# Patient Record
Sex: Female | Born: 1945 | Race: White | Hispanic: No | Marital: Married | Smoking: Never smoker
Health system: Southern US, Community
[De-identification: ages and names within clinical notes are randomized; demographics above are authoritative.]

## PROBLEM LIST (undated history)

## (undated) DIAGNOSIS — N200 Calculus of kidney: Secondary | ICD-10-CM

## (undated) DIAGNOSIS — I10 Essential (primary) hypertension: Secondary | ICD-10-CM

## (undated) DIAGNOSIS — K2100 Gastro-esophageal reflux disease with esophagitis, without bleeding: Secondary | ICD-10-CM

## (undated) DIAGNOSIS — E119 Type 2 diabetes mellitus without complications: Secondary | ICD-10-CM

## (undated) DIAGNOSIS — Z9889 Other specified postprocedural states: Secondary | ICD-10-CM

## (undated) DIAGNOSIS — R112 Nausea with vomiting, unspecified: Secondary | ICD-10-CM

## (undated) DIAGNOSIS — E669 Obesity, unspecified: Secondary | ICD-10-CM

## (undated) DIAGNOSIS — R002 Palpitations: Secondary | ICD-10-CM

## (undated) DIAGNOSIS — M1990 Unspecified osteoarthritis, unspecified site: Secondary | ICD-10-CM

## (undated) DIAGNOSIS — E785 Hyperlipidemia, unspecified: Secondary | ICD-10-CM

## (undated) HISTORY — DX: Palpitations: R00.2

## (undated) HISTORY — PX: CATARACT EXTRACTION: SUR2

## (undated) HISTORY — DX: Type 2 diabetes mellitus without complications: E11.9

## (undated) HISTORY — PX: APPENDECTOMY: SHX54

## (undated) HISTORY — DX: Calculus of kidney: N20.0

## (undated) HISTORY — DX: Gastro-esophageal reflux disease with esophagitis: K21.0

## (undated) HISTORY — PX: ABDOMINAL HYSTERECTOMY: SHX81

## (undated) HISTORY — PX: CHEST WALL BIOPSY: SHX1338

## (undated) HISTORY — DX: Gastro-esophageal reflux disease with esophagitis, without bleeding: K21.00

## (undated) HISTORY — DX: Essential (primary) hypertension: I10

## (undated) HISTORY — DX: Hyperlipidemia, unspecified: E78.5

## (undated) HISTORY — DX: Obesity, unspecified: E66.9

---

## 1998-12-02 ENCOUNTER — Other Ambulatory Visit: Admission: RE | Admit: 1998-12-02 | Discharge: 1998-12-02 | Payer: Self-pay | Admitting: *Deleted

## 1999-09-14 ENCOUNTER — Encounter: Admission: RE | Admit: 1999-09-14 | Discharge: 1999-09-14 | Payer: Self-pay | Admitting: Otolaryngology

## 1999-09-14 ENCOUNTER — Encounter: Payer: Self-pay | Admitting: Otolaryngology

## 2000-02-10 ENCOUNTER — Other Ambulatory Visit: Admission: RE | Admit: 2000-02-10 | Discharge: 2000-02-10 | Payer: Self-pay | Admitting: *Deleted

## 2001-02-15 ENCOUNTER — Other Ambulatory Visit: Admission: RE | Admit: 2001-02-15 | Discharge: 2001-02-15 | Payer: Self-pay | Admitting: *Deleted

## 2001-05-30 HISTORY — PX: COLONOSCOPY: SHX174

## 2001-07-12 ENCOUNTER — Ambulatory Visit (HOSPITAL_COMMUNITY): Admission: RE | Admit: 2001-07-12 | Discharge: 2001-07-12 | Payer: Self-pay | Admitting: Orthopaedic Surgery

## 2001-08-17 ENCOUNTER — Ambulatory Visit (HOSPITAL_COMMUNITY): Admission: RE | Admit: 2001-08-17 | Discharge: 2001-08-17 | Payer: Self-pay | Admitting: Internal Medicine

## 2001-09-20 ENCOUNTER — Other Ambulatory Visit: Admission: RE | Admit: 2001-09-20 | Discharge: 2001-09-20 | Payer: Self-pay | Admitting: Dermatology

## 2003-02-06 ENCOUNTER — Encounter: Admission: RE | Admit: 2003-02-06 | Discharge: 2003-05-07 | Payer: Self-pay | Admitting: Endocrinology

## 2003-04-17 ENCOUNTER — Ambulatory Visit (HOSPITAL_COMMUNITY): Admission: RE | Admit: 2003-04-17 | Discharge: 2003-04-17 | Payer: Self-pay | Admitting: Pulmonary Disease

## 2004-11-08 ENCOUNTER — Ambulatory Visit: Payer: Self-pay | Admitting: Internal Medicine

## 2004-11-08 ENCOUNTER — Ambulatory Visit (HOSPITAL_COMMUNITY): Admission: RE | Admit: 2004-11-08 | Discharge: 2004-11-08 | Payer: Self-pay | Admitting: Internal Medicine

## 2004-11-08 HISTORY — PX: COLONOSCOPY: SHX174

## 2009-11-19 ENCOUNTER — Ambulatory Visit: Payer: Self-pay | Admitting: Otolaryngology

## 2009-12-28 HISTORY — PX: COLONOSCOPY: SHX174

## 2010-01-22 ENCOUNTER — Encounter: Payer: Self-pay | Admitting: Internal Medicine

## 2010-01-27 ENCOUNTER — Ambulatory Visit (HOSPITAL_COMMUNITY): Admission: RE | Admit: 2010-01-27 | Discharge: 2010-01-27 | Payer: Self-pay | Admitting: Internal Medicine

## 2010-01-27 ENCOUNTER — Ambulatory Visit: Payer: Self-pay | Admitting: Internal Medicine

## 2010-03-17 ENCOUNTER — Ambulatory Visit (HOSPITAL_COMMUNITY): Admission: RE | Admit: 2010-03-17 | Discharge: 2010-03-17 | Payer: Self-pay | Admitting: Pulmonary Disease

## 2010-07-01 NOTE — Letter (Signed)
Summary: TRIAGE  TRIAGE   Imported By: Rosine Beat 01/22/2010 12:29:29  _____________________________________________________________________  External Attachment:    Type:   Image     Comment:   External Document

## 2010-08-12 LAB — GLUCOSE, CAPILLARY: Glucose-Capillary: 150 mg/dL — ABNORMAL HIGH (ref 70–99)

## 2010-10-15 NOTE — Op Note (Signed)
[facility]  Patient:    Amanda Hopkins, Amanda Hopkins Visit Number: [id number] MRN: [id number]          Service Type: END Location: DAY Attending Physician:  Jonathon Bellows Dictated by:   Roetta Sessions, M.D. Proc. Date: 08/17/01 Admit Date:  08/17/2001 Discharge Date: 08/17/2001   CC:         Kari Baars, M.D.   Operative Report  PROCEDURE:  Colonoscopy with snare polypectomy.  ENDOSCOPIST:  Roetta Sessions, M.D.  INDICATIONS:  The patient is a 65 year old lady with intermittent bright red blood per rectum.  Colonoscopy is now being done to further evaluate her symptoms.  Approach has been discussed with the patient previously.  Potential risks, benefits and alternatives have been reviewed as well as the low risks for conscious sedation with Versed and Demerol. Please see my July 24, 2001, consultation note for more information.  DESCRIPTION OF PROCEDURE:  Oxygen saturation, blood pressure, pulse and respirations were monitored throughout the entire procedure.  Conscious sedation with IV Versed and Demerol in incremental doses.  Instrument Olympus video colonoscope.  Findings:  Digital rectal exam revealed no abnormalities.  Endoscopic findings:  The prep was good.  Rectum:  Examination of the rectal mucosa including a retroflexed view of the anal verge revealed no abnormalities.  There was a slight amount of blood in the proximal rectum.  At the rectosigmoid approximately 25 cm there was a multilobulated pedunculated polyp which was angry approximately 2 cm in dimensions on a long stalk.  There were a couple of 5 mm polyps beside the large polyp and a couple of diminished polyps as well.  The scope was advanced beyond this area easily through the transverse right colon to the area of the appendiceal orifice, ileocecal valve and cecum.  These structures were well seen and photographed for the record. No other abnormalities were observed upon  advancing the scope to the cecum from the level of the cecum and the ileocecal valve.  The scope was slowly withdrawn.  All previous mentioned mucosal surfaces were again seen and again no other abnormalities were seen.  The large polyp was removed in a piecemeal fashion.  There was good hemostasis.  The polyp fragments were recovered.  The two smaller polyps were removed with snare cautery and recovered.  Several diminutive polyps were destroyed with the tip of the snare.  The patient tolerated the procedure well and was reactive in endoscopy.  IMPRESSION: 1. Large pedunculated polyp at the rectosigmoid with smaller adjacent polyps,    snared and were destroyed as described above. 2. Left sided diverticular remainder of the rectum and colonic mucosa appeared    normal. 3. I suspect that the patient had been bleeding from a large polyp removed    from the rectosigmoid today.  RECOMMENDATIONS: 1. The patient was admonished not to take any aspirin or nonsteroidal    medications for the next 10 days. 2. Follow up on pathology. 3. Diverticulosis literature given to [name]. 4. Further recommendations to follow. Dictated by:   Roetta Sessions, M.D. Attending Physician:  Jonathon Bellows DD:  08/17/01 TD:  08/20/01 Job: [id number]

## 2010-10-15 NOTE — Op Note (Signed)
Amanda Hopkins, Amanda Hopkins               ACCOUNT NO.:  [id number]   MEDICAL RECORD NO.:  [id number]          PATIENT TYPE:  AMB   LOCATION:  DAY                           FACILITY:  [facility]   PHYSICIAN:  R. Roetta Sessions, M.D. DATE OF BIRTH:  12/03/1945   DATE OF PROCEDURE:  11/08/2004  DATE OF DISCHARGE:                                 OPERATIVE REPORT   PROCEDURE PERFORMED:  Surveillance colonoscopy/colonoscopy with biopsy.   INDICATIONS FOR PROCEDURE:  The patient is a 65 year old lady with a history  of carcinoma in situ and a polyp removed from her rectosigmoid three years  ago.  She is here for surveillance.  She is not having any GI symptoms.  This approach has been discussed with the patient at length.  Potential  risks, benefits and alternatives have been reviewed, questions have been  answered, she is agreeable.  Please see documentation in the medical  records.   PROCEDURE NOTE:  Oxygen saturations, blood pressure, pulse and respirations  were monitored throughout the entire procedure.  Conscious sedation Versed 6  mg IV, Demerol 125 mg IV in divided doses.   INSTRUMENT USED:  Olympus video chip system.   FINDINGS:  Digital exam revealed no abnormalities.   ENDOSCOPIC FINDINGS:  Prep was adequate.   Rectum:  Examination of the rectal mucosa including retroflex view of the  anal verge revealed two 3 mm polyps at the rectosigmoid junction.  Otherwise  the rectal mucosa appeared normal.  Colon:  Colonic mucosa was surveyed from the rectosigmoid junction to the  left, transverse and right colon to the area of the appendiceal orifice and  ileocecal valve and cecum.  These structures were well seen and photographed  for the record.  From this level, the scope was slowly withdrawn.  All  previously mentioned mucosal surfaces were again seen. The patient was noted  to have left-sided diverticula and a single 3 mm polyp just in the distal  sigmoid.  The two rectal polyps and the  distal sigmoid polyp were removed  with cold biopsy forcep technique.  The patient tolerated the procedure  well, was reacted in endoscopy.   IMPRESSION:  Diminutive rectosigmoid polyp as described above, cold  biopsied/removed.  The remainder of the rectal mucosa appeared normal.  Left-  sided diverticula.  Remainder of colonic mucosa appeared normal.   RECOMMENDATIONS:  1.  Diverticulosis literature provided to [name].  2.  Follow-up on pathology.  3.  Further recommendations to follow.       RMR/MEDQ  D:  11/08/2004  T:  11/08/2004  Job:  [id number]

## 2010-10-15 NOTE — Op Note (Signed)
[facility]  Patient:    Amanda Hopkins, Amanda Hopkins Visit Number: [id number] MRN: [id number]          Service Type: DSU Location: DAY Attending Physician:  Windle Guard Dictated by:   Darreld Mclean, M.D. 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  PROCEDURE:  Tenovaginotomy--release of first extensor compartment retinaculum, left wrist.  ANESTHESIA: Bier block.  SURGEON:  Darreld Mclean, M.D.  ASSISTANT:  Candace Cruise, P.A.-C.  DRAINS:  No drains used.  COMPLICATIONS:  None.  INDICATIONS:  The patient is a 65 year old white female who has been having significant pain and tenderness in the left wrist with de Quervains tenosynovitis.  It has been unrelieved by conservative treatment, injections, and anti-inflammatories.  Surgery is now indicated.  The risks and problems were discussed including possible injury to the radial sensory nerve.  DESCRIPTION OF PROCEDURE:  The patient was given Bier block anesthesia and was placed supine on the operating table with a hand table attached.  An area 1 cm proximal to the radial styloid was marked with a marking pen after the patient was prepped and draped and after Bier block anesthesia had been introduced. With very careful dissection and just as soon as we got in the dermal layer of the skin, a longitudinal exploration of the wound was done with a small mosquito hemostat. Care was taken to avoid any possible injury to the radial nerve.  Small branches were identified and retracted. The retinaculum over the first extensor compartment was identified, and using a knife, this was then incised. The tendons were then identified.  The patients thumb was then moved with full range of motion.  A  specimen of retinaculum was sent to pathology. The nerves were identified with no apparent injury.  The superficial branches of the radial nerve were then reapproximated using 2-0 plain and then a running subcuticular 3-0 nylon.  A sterile dressing and a bulky dressing were applied. The patient tolerated the procedure well and went to recovery room in good condition. Appropriate analgesia was given for pain.  I will see the patient in the office Monday with gentle range of motion activity. Complications were none. Dictated by:   Darreld Mclean, M.D. Attending Physician:  Windle Guard DD:  07/12/01 TD:  07/12/01 Job: [id number]

## 2013-04-29 ENCOUNTER — Encounter: Payer: Self-pay | Admitting: Vascular Surgery

## 2013-04-29 ENCOUNTER — Other Ambulatory Visit: Payer: Self-pay | Admitting: Vascular Surgery

## 2013-04-29 DIAGNOSIS — I83893 Varicose veins of bilateral lower extremities with other complications: Secondary | ICD-10-CM

## 2013-06-10 ENCOUNTER — Encounter: Payer: Self-pay | Admitting: Vascular Surgery

## 2013-06-11 ENCOUNTER — Ambulatory Visit (INDEPENDENT_AMBULATORY_CARE_PROVIDER_SITE_OTHER): Payer: Self-pay | Admitting: Vascular Surgery

## 2013-06-11 ENCOUNTER — Encounter: Payer: Self-pay | Admitting: Vascular Surgery

## 2013-06-11 ENCOUNTER — Encounter (HOSPITAL_COMMUNITY): Payer: Self-pay

## 2013-06-11 ENCOUNTER — Encounter (INDEPENDENT_AMBULATORY_CARE_PROVIDER_SITE_OTHER): Payer: Self-pay

## 2013-06-11 ENCOUNTER — Ambulatory Visit (HOSPITAL_COMMUNITY)
Admission: RE | Admit: 2013-06-11 | Discharge: 2013-06-11 | Disposition: A | Discharge status: Home / Self Care | Payer: Medicare PPO | Source: Ambulatory Visit | Attending: Vascular Surgery | Admitting: Vascular Surgery

## 2013-06-11 VITALS — BP 120/77 | HR 88 | Resp 16 | Ht 67.0 in | Wt 225.0 lb

## 2013-06-11 DIAGNOSIS — I83893 Varicose veins of bilateral lower extremities with other complications: Secondary | ICD-10-CM | POA: Insufficient documentation

## 2013-06-11 NOTE — Progress Notes (Signed)
Subjective:     Patient ID: Amanda Hopkins, female   DOB: 1945-08-13, 68 y.o.   MRN: [id number]  HPI this 68 year old female is referred for evaluation of painful varicosities in the right leg for the past few years patient has had increasing discomfort in the medial aspect of the right lower leg over some prominent pericostal these. She has no history of DVT, thrombophlebitis, bleeding, stasis ulcers, or other complications. She does have some swelling as the day progresses. She does not relaxed and compression stockings were elevate her legs. She has no symptoms in the contralateral left leg. She complains of itching burning and stinging discomfort overlying these varicosities which are worse the longer she is standing.  Past Medical History  Diagnosis Date  . Diabetes mellitus without complication   . Hyperlipidemia   . Reflux esophagitis   . Hypertension   . Palpitations   . Obesity     History  Substance Use Topics  . Smoking status: Never Smoker   . Smokeless tobacco: Not on file  . Alcohol Use: No    Family History  Problem Relation Age of Onset  . Hypertension Mother     Allergies  Allergen Reactions  . Erythromycin     Current outpatient prescriptions:Canagliflozin (INVOKANA) 100 MG TABS, Take by mouth daily., Disp: , Rfl: ;  Liraglutide (VICTOZA) 18 MG/3ML SOPN, Inject into the skin., Disp: , Rfl: ;  losartan (COZAAR) 25 MG tablet, Take 25 mg by mouth daily., Disp: , Rfl: ;  metFORMIN (GLUCOPHAGE) 500 MG tablet, Take by mouth 2 (two) times daily with a meal., Disp: , Rfl: ;  rosuvastatin (CRESTOR) 10 MG tablet, Take 10 mg by mouth daily., Disp: , Rfl:  Insulin Detemir (LEVEMIR FLEXPEN) 100 UNIT/ML SOPN, Inject into the skin., Disp: , Rfl: ;  linagliptin (TRADJENTA) 5 MG TABS tablet, Take 5 mg by mouth daily., Disp: , Rfl: ;  mupirocin ointment (BACTROBAN) 2 %, Place 1 application into the nose 2 (two) times daily., Disp: , Rfl:   BP 120/77  Pulse 88  Resp 16  Ht 5\' 7"   (1.702 m)  Wt 225 lb (102.059 kg)  BMI 35.23 kg/m2  Body mass index is 35.23 kg/(m^2).           Review of Systems denies chest pain, dyspnea on exertion, PND, orthopnea, hemoptysis, claudication, lateralizing weakness, aphasia. All systems negative and a complete review of systems    Objective:   Physical Exam BP 120/77  Pulse 88  Resp 16  Ht 5\' 7"  (1.702 m)  Wt 225 lb (102.059 kg)  BMI 35.23 kg/m2  Gen.-alert and oriented x3 in no apparent distress HEENT normal for age Lungs no rhonchi or wheezing Cardiovascular regular rhythm no murmurs carotid pulses 3+ palpable no bruits audible Abdomen soft nontender no palpable masses Musculoskeletal free of  major deformities Skin clear -no rashes Neurologic normal Lower extremities 3+ femoral and dorsalis pedis pulses palpable bilaterally with no edema on the left 1+ edema on the right Cluster of bulging varicosities in the medial calf over the great saphenous vein with some hyperpigmented skin overlying this but no active ulceration.  Today I ordered venous duplex exam which I reviewed and interpreted. The right leg has gross reflux in the small saphenous vein and the anterior accessory branch of the great saphenous vein which communicates with the great saphenous vein at the knee level and there is reflux in the great saphenous vein in the calf. There is no DVT. Left  leg has reflux in the left great saphenous vein.        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    Plan:        #1 long leg elastic compression stockings 20-30 mm gradient #2 elevate legs as much as possible #3 ibuprofen daily on a regular basis for pain #4 return in 3 months-if no significant improvement then patient should have #1 laser ablation anterior excess Roux branch right great  saphenous vein followed by a #2 laser ablation right small saphenous vein. She then weight 3 months to see if stab phlebectomy of these painful varicosities will then be indicated

## 2013-07-10 ENCOUNTER — Other Ambulatory Visit (HOSPITAL_COMMUNITY): Payer: Self-pay | Admitting: Pulmonary Disease

## 2013-07-10 ENCOUNTER — Ambulatory Visit (HOSPITAL_COMMUNITY)
Admission: RE | Admit: 2013-07-10 | Discharge: 2013-07-10 | Disposition: A | Discharge status: Home / Self Care | Payer: Medicare PPO | Source: Ambulatory Visit | Attending: Pulmonary Disease | Admitting: Pulmonary Disease

## 2013-07-10 DIAGNOSIS — M25531 Pain in right wrist: Secondary | ICD-10-CM

## 2013-07-10 DIAGNOSIS — M25539 Pain in unspecified wrist: Secondary | ICD-10-CM | POA: Insufficient documentation

## 2013-08-13 ENCOUNTER — Encounter: Payer: Self-pay | Admitting: Orthopedic Surgery

## 2013-08-13 ENCOUNTER — Ambulatory Visit (INDEPENDENT_AMBULATORY_CARE_PROVIDER_SITE_OTHER): Payer: Medicare PPO | Admitting: Orthopedic Surgery

## 2013-08-13 VITALS — BP 123/76 | Ht 67.0 in | Wt 229.0 lb

## 2013-08-13 DIAGNOSIS — M654 Radial styloid tenosynovitis [de Quervain]: Secondary | ICD-10-CM | POA: Insufficient documentation

## 2013-08-13 NOTE — Patient Instructions (Signed)
  Continue bracing  Use Aspercream 3x times a day  Joint Injection  Care After  Refer to this sheet in the next few days. These instructions provide you with information on caring for yourself after you have had a joint injection. Your caregiver also may give you more specific instructions. Your treatment has been planned according to current medical practices, but problems sometimes occur. Call your caregiver if you have any problems or questions after your procedure.  After any type of joint injection, it is not uncommon to experience:  Soreness, swelling, or bruising around the injection site.  Mild numbness, tingling, or weakness around the injection site caused by the numbing medicine used before or with the injection. It also is possible to experience the following effects associated with the specific agent after injection:  Iodine-based contrast agents:  Allergic reaction (itching, hives, widespread redness, and swelling beyond the injection site).  Corticosteroids (These effects are rare.):  Allergic reaction.  Increased blood sugar levels (If you have diabetes and you notice that your blood sugar levels have increased, notify your caregiver).  Increased blood pressure levels.  Mood swings.  Hyaluronic acid in the use of viscosupplementation.  Temporary heat or redness.  Temporary rash and itching.  Increased fluid accumulation in the injected joint. These effects all should resolve within a day after your procedure.  HOME CARE INSTRUCTIONS  Limit yourself to light activity the day of your procedure. Avoid lifting heavy objects, bending, stooping, or twisting.  Take prescription or over-the-counter pain medication as directed by your caregiver.  You may apply ice to your injection site to reduce pain and swelling the day of your procedure. Ice may be applied 3-4 times:  Put ice in a plastic bag.  Place a towel between your skin and the bag.  Leave the ice on for no longer than  15-20 minutes each time. SEEK IMMEDIATE MEDICAL CARE IF:  Pain and swelling get worse rather than better or extend beyond the injection site.  Numbness does not go away.  Blood or fluid continues to leak from the injection site.  You have chest pain.  You have swelling of your face or tongue.  You have trouble breathing or you become dizzy.  You develop a fever, chills, or severe tenderness at the injection site that last longer than 1 day. MAKE SURE YOU:  Understand these instructions.  Watch your condition.  Get help right away if you are not doing well or if you get worse. Document Released: 01/27/2011 Document Revised: 08/08/2011 Document Reviewed: 01/27/2011  [facility] Patient Information 2014 [place].

## 2013-08-13 NOTE — Progress Notes (Signed)
Patient ID: Amanda Hopkins, female   DOB: [birth year], 68 y.o.   MRN: [id number]  Chief Complaint  Patient presents with  . Wrist Pain    Right wrist pain x 6 wksw  no injury. Consult from Dr. Luan Pulling    HISTORY: 69 year old female presents as a consultation regarding right wrist pain. Patient status post de Quervain's release left wrist many years ago thinks she may have the same thing. Her pain started in January of this year with no trauma. She had an x-ray was negative. She were brace for 6 weeks she took oral steroids as well but still complains of sharp throbbing stabbing 8/10 intermittent pain which is worse when she's trying to lift something. Medical history as recorded and reviewed. Review of systems negative except for muscle pain  BP 123/76  Ht 5\' 7"  (1.702 m)  Wt 229 lb (103.874 kg)  BMI 35.86 kg/m2 General appearance is normal, the patient is alert and oriented x3 with normal mood and affect. Ambulation is normal but noncontributory the left wrist has an incision consistent with de Quervain's release  The right wrist is tender over the extensor tendons to compartment #1 she is painful ulnar deviation but full range of motion wrist joint is stable motor exam is normal in terms of extensor tendons and flexor tendons of the thumb the skin is intact with no rashes good pulse normal perfusion and normal sensation without lymphadenopathy  X-ray is reviewed with the report which is normal  Impression de Quervain's syndrome  Recommend injection since she has art he failed brace therapy and oral anti-inflammatory with steroid  Return in 2 weeks if no improvement discussed surgical options at that point. Add aspirin 3 times a day in the interim

## 2013-08-27 ENCOUNTER — Ambulatory Visit (INDEPENDENT_AMBULATORY_CARE_PROVIDER_SITE_OTHER): Payer: Medicare PPO | Admitting: Orthopedic Surgery

## 2013-08-27 VITALS — BP 122/78 | Ht 67.0 in | Wt 229.0 lb

## 2013-08-27 DIAGNOSIS — M654 Radial styloid tenosynovitis [de Quervain]: Secondary | ICD-10-CM

## 2013-08-27 NOTE — Patient Instructions (Addendum)
Brace x 6 weeks   Ice 3 x a day

## 2013-08-27 NOTE — Progress Notes (Signed)
Patient ID: Amanda Hopkins, female   DOB: 05-15-1946, 68 y.o.   MRN: [id number] Chief Complaint  Patient presents with  . Follow-up    2 week recheck right tendonitis s/p injection    Status post injection and splinting for de Quervain's syndrome of the right wrist  She's doing well with improved symptoms mild pain  No numbness or tingling  BP 122/78  Ht 5\' 7"  (1.702 m)  Wt 229 lb (103.874 kg)  BMI 35.86 kg/m2 General appearance is normal, the patient is alert and oriented x3 with normal mood and affect.  Mild tenderness in the first extensor compartment normal pain-free range of motion. Stability normal wrist and thumb extension normal skin intact normal pulse and perfusion  De Quervain's syndrome resolving continue ice and splinting return 6 weeks

## 2013-09-09 ENCOUNTER — Encounter: Payer: Self-pay | Admitting: Vascular Surgery

## 2013-09-10 ENCOUNTER — Ambulatory Visit (INDEPENDENT_AMBULATORY_CARE_PROVIDER_SITE_OTHER): Payer: Medicare PPO | Admitting: Vascular Surgery

## 2013-09-10 ENCOUNTER — Encounter: Payer: Self-pay | Admitting: Vascular Surgery

## 2013-09-10 VITALS — BP 121/78 | HR 91 | Ht 67.0 in | Wt 228.0 lb

## 2013-09-10 DIAGNOSIS — I83893 Varicose veins of bilateral lower extremities with other complications: Secondary | ICD-10-CM

## 2013-09-10 NOTE — Progress Notes (Signed)
Subjective:     Patient ID: Amanda Hopkins, female   DOB: Jan 04, 1946, 68 y.o.   MRN: [id number]  HPI this 68 year old female returns for further followup regarding her painful varicosities in the right leg. She has her callosities in the right medial thigh and the posterior calf area which are quite tender and become increasingly symptomatic as the day progresses. She has tried long-leg elastic compression stockings 20-30 mm gradient as well as elevation and ibuprofen with no success. This continues to affect her daily living because of pain and swelling.  Past Medical History  Diagnosis Date  . Diabetes mellitus without complication   . Hyperlipidemia   . Reflux esophagitis   . Hypertension   . Palpitations   . Obesity     History  Substance Use Topics  . Smoking status: Never Smoker   . Smokeless tobacco: Not on file  . Alcohol Use: No    Family History  Problem Relation Age of Onset  . Hypertension Mother     Allergies  Allergen Reactions  . Erythromycin     Current outpatient prescriptions:Insulin Detemir (LEVEMIR FLEXPEN) 100 UNIT/ML SOPN, Inject into the skin., Disp: , Rfl: ;  Liraglutide (VICTOZA) 18 MG/3ML SOPN, Inject into the skin., Disp: , Rfl: ;  metFORMIN (GLUCOPHAGE) 500 MG tablet, Take by mouth 2 (two) times daily with a meal., Disp: , Rfl: ;  rosuvastatin (CRESTOR) 10 MG tablet, Take 10 mg by mouth daily., Disp: , Rfl:  Canagliflozin (INVOKANA) 100 MG TABS, Take by mouth daily., Disp: , Rfl: ;  linagliptin (TRADJENTA) 5 MG TABS tablet, Take 5 mg by mouth daily., Disp: , Rfl: ;  losartan (COZAAR) 25 MG tablet, Take 25 mg by mouth daily., Disp: , Rfl: ;  mupirocin ointment (BACTROBAN) 2 %, Place 1 application into the nose 2 (two) times daily., Disp: , Rfl:   BP 121/78  Pulse 91  Ht 5\' 7"  (1.702 m)  Wt 228 lb (103.42 kg)  BMI 35.70 kg/m2  SpO2 98%  Body mass index is 35.7 kg/(m^2).          Review of Systems denies chest pain, dyspnea on exertion,  PND, orthopnea, hemoptysis, claudication.     Objective:   Physical Exam BP 121/78  Pulse 91  Ht 5\' 7"  (1.702 m)  Wt 228 lb (103.42 kg)  BMI 35.70 kg/m2  SpO2 98%  General well-developed well-nourished female in no apparent distress alert and oriented x3 Lungs no rhonchi or wheezing Right leg with bulging varicosities in the distal medial thigh and posterior calf and 1+ edema with 3 posterior cells pedis pulse palpable.  Patient has documented gross reflux in the anterior accessory branch of the right great saphenous vein which is large and communicates with the great saphenous vein in the distal thigh and calf area. 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 from gross reflux in the anterior accessory branch right great saphenous vein which communicates with great saphenous vein and distal thigh and calf and also gross reflux and right small saphenous vein Symptoms are resistant to conservative measures including long-leg elastic compression stockings, elevation, and ibuprofen and are affecting patient's daily living    Plan:     Patient needs #1 laser ablation of anterior accessory branch right great saphenous vein followed by #2 laser ablation small saphenous vein-right The patient will then return in 3  months to further evaluate secondary varicosities to see if stab phlebectomy would be necessary The procedure was precertification to perform this in the near future

## 2013-09-13 ENCOUNTER — Other Ambulatory Visit: Payer: Self-pay | Admitting: *Deleted

## 2013-09-13 DIAGNOSIS — I83893 Varicose veins of bilateral lower extremities with other complications: Secondary | ICD-10-CM

## 2013-09-23 ENCOUNTER — Other Ambulatory Visit: Payer: Self-pay | Admitting: *Deleted

## 2013-09-23 DIAGNOSIS — I83893 Varicose veins of bilateral lower extremities with other complications: Secondary | ICD-10-CM

## 2013-10-08 ENCOUNTER — Ambulatory Visit (INDEPENDENT_AMBULATORY_CARE_PROVIDER_SITE_OTHER): Payer: Medicare PPO | Admitting: Orthopedic Surgery

## 2013-10-08 DIAGNOSIS — M19049 Primary osteoarthritis, unspecified hand: Secondary | ICD-10-CM

## 2013-10-08 NOTE — Patient Instructions (Signed)
Splint Ice Topical creme

## 2013-10-09 ENCOUNTER — Encounter: Payer: Self-pay | Admitting: Orthopedic Surgery

## 2013-10-09 DIAGNOSIS — M19049 Primary osteoarthritis, unspecified hand: Secondary | ICD-10-CM | POA: Insufficient documentation

## 2013-10-09 NOTE — Progress Notes (Signed)
Patient ID: Amanda Hopkins, female   DOB: January 22, 1946, 68 y.o.   MRN: [id number]  Followup de Quervain's syndrome right wrist after splinting and ice and she has improved. She had a history of left de Quervain's release presents now with pain tenderness and swelling over the left thumb.  Her pain worse at the base of the thumb.  No new findings on review of systems  Pain has been present now for couple of weeks it is a dull throbbing type sensation it is related to exercise and activity it is unrelieved by ice and splinting.  It is unassociated with numbness or tingling or locking.  General appearance is normal, the patient is alert and oriented x3 with normal mood and affect. 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  Lidocaine 1% 1 cc.  The skin was prepped with alcohol and upper chloride and then the injection was performed  into the [facility] joint  There were no complications

## 2013-11-01 ENCOUNTER — Encounter: Payer: Self-pay | Admitting: Vascular Surgery

## 2013-11-04 ENCOUNTER — Ambulatory Visit (INDEPENDENT_AMBULATORY_CARE_PROVIDER_SITE_OTHER): Payer: Medicare PPO | Admitting: Vascular Surgery

## 2013-11-04 ENCOUNTER — Encounter: Payer: Self-pay | Admitting: Vascular Surgery

## 2013-11-04 VITALS — BP 110/74 | HR 89 | Resp 16 | Ht 67.0 in | Wt 225.0 lb

## 2013-11-04 DIAGNOSIS — I83893 Varicose veins of bilateral lower extremities with other complications: Secondary | ICD-10-CM

## 2013-11-04 NOTE — Progress Notes (Signed)
   Laser Ablation Procedure      Date: 11/04/2013    Amanda Hopkins DOB:April 30, 1946  Consent signed: Yes  Surgeon:J.D. Kellie Simmering  Procedure: Laser Ablation: right Greater Saphenous Vein Ant accessory branch  BP 110/74  Pulse 89  Resp 16  Ht 5\' 7"  (1.702 m)  Wt 225 lb (102.059 kg)  BMI 35.23 kg/m2  Start time: 1   End time: 1:30  Tumescent Anesthesia: 300 cc 0.9% NaCl with 50 cc Lidocaine HCL with 1% Epi and 15 cc 8.4% NaHCO3  Local Anesthesia: 5 cc Lidocaine HCL and NaHCO3 (ratio 2:1)  Pulsed mode: 15 watts, 531ms delay, 1.0 duration Total energy: 1.182, total pulses: 79, total time: 1:19     Patient tolerated procedure well: Yes  Notes:   Description of Procedure:  After marking the course of the saphenous vein and the secondary varicosities in the standing position, the patient was placed on the operating table in the supine position, and the right leg was prepped and draped in sterile fashion. Local anesthetic was administered, and under ultrasound guidance the saphenous vein was accessed with a micro needle and guide wire; then the micro puncture sheath was placed. A guide wire was inserted to the saphenofemoral junction, followed by a 5 french sheath.  The position of the sheath and then the laser fiber below the junction was confirmed using the ultrasound and visualization of the aiming beam.  Tumescent anesthesia was administered along the course of the saphenous vein using ultrasound guidance. Protective laser glasses were placed on the patient, and the laser was fired at 15 watt pulsed mode advancing 1-2 mm per sec.  For a total of 1.182 joules.  A steri strip was applied to the puncture site.    ABD pads and thigh high compression stockings were applied.  Ace wrap bandages were applied over the phlebectomy sites and at the top of the saphenofemoral junction.  Blood loss was less than 15 cc.  The patient ambulated out of the operating room having tolerated the procedure  well.

## 2013-11-04 NOTE — Progress Notes (Signed)
Subjective:     Patient ID: Amanda Hopkins, female   DOB: 1946/01/03, 68 y.o.   MRN: [id number]  HPI this 68 year old female had laser ablation of the anterior accessory branch of the right great saphenous vein from the distal thigh to near the saphenofemoral junction performed under local tumescent anesthesia. A total of 1182 J of energy was utilized. She tolerated the procedure well.   Review of Systems     Objective:   Physical Exam BP 110/74  Pulse 89  Resp 16  Ht 5\' 7"  (1.702 m)  Wt 225 lb (102.059 kg)  BMI 35.23 kg/m2       Assessment:     Well-tolerated laser ablation anterior accessory branch right great saphenous vein performed under local tumescent anesthesia    Plan:     Return in one week for a venous duplex exam right leg We'll then proceed with similar procedure on right small saphenous vein

## 2013-11-05 ENCOUNTER — Telehealth: Payer: Self-pay | Admitting: *Deleted

## 2013-11-05 ENCOUNTER — Encounter: Payer: Self-pay | Admitting: Vascular Surgery

## 2013-11-05 NOTE — Telephone Encounter (Signed)
Pt doing well but having a little discomfort. Following all instructions. Reminded her of her fu appt next Monday.

## 2013-11-06 ENCOUNTER — Encounter (HOSPITAL_COMMUNITY): Payer: Self-pay | Admitting: Pharmacy Technician

## 2013-11-08 ENCOUNTER — Encounter: Payer: Self-pay | Admitting: Vascular Surgery

## 2013-11-11 ENCOUNTER — Other Ambulatory Visit: Payer: Self-pay | Admitting: *Deleted

## 2013-11-11 ENCOUNTER — Ambulatory Visit (HOSPITAL_COMMUNITY)
Admission: RE | Admit: 2013-11-11 | Discharge: 2013-11-11 | Disposition: A | Discharge status: Home / Self Care | Payer: Medicare PPO | Source: Ambulatory Visit | Attending: Vascular Surgery | Admitting: Vascular Surgery

## 2013-11-11 ENCOUNTER — Ambulatory Visit (INDEPENDENT_AMBULATORY_CARE_PROVIDER_SITE_OTHER): Payer: Self-pay | Admitting: Vascular Surgery

## 2013-11-11 ENCOUNTER — Encounter: Payer: Self-pay | Admitting: Vascular Surgery

## 2013-11-11 VITALS — BP 119/70 | HR 85 | Resp 16 | Ht 67.0 in | Wt 225.0 lb

## 2013-11-11 DIAGNOSIS — I83893 Varicose veins of bilateral lower extremities with other complications: Secondary | ICD-10-CM

## 2013-11-11 NOTE — Progress Notes (Signed)
Subjective:     Patient ID: Amanda Hopkins, female   DOB: 1946/05/22, 68 y.o.   MRN: [id number]  HPI this 68 year old female returns for followup regarding her laser ablation right great saphenous vein-anterior accessory branch which was performed one week ago. She has had some mild discomfort along the course of the great saphenous vein and some moderate ecchymosis. She's had no distal edema. She has worn elastic compression stockings and taken ibuprofen as instructed.  Past Medical History  Diagnosis Date  . Diabetes mellitus without complication   . Hyperlipidemia   . Reflux esophagitis   . Hypertension   . Palpitations   . Obesity     History  Substance Use Topics  . Smoking status: Never Smoker   . Smokeless tobacco: Not on file  . Alcohol Use: No    Family History  Problem Relation Age of Onset  . Hypertension Mother     Allergies  Allergen Reactions  . Erythromycin     Current outpatient prescriptions:Insulin Detemir (LEVEMIR FLEXPEN) 100 UNIT/ML SOPN, Inject 15 Units into the skin at bedtime. , Disp: , Rfl: ;  Liraglutide (VICTOZA) 18 MG/3ML SOPN, Inject into the skin., Disp: , Rfl: ;  loratadine (CLARITIN) 10 MG tablet, Take 10 mg by mouth daily as needed for allergies., Disp: , Rfl: ;  losartan (COZAAR) 25 MG tablet, Take 25 mg by mouth at bedtime. , Disp: , Rfl:  metFORMIN (GLUCOPHAGE) 500 MG tablet, Take by mouth 2 (two) times daily with a meal., Disp: , Rfl: ;  rosuvastatin (CRESTOR) 10 MG tablet, Take 10 mg by mouth at bedtime. , Disp: , Rfl:   BP 119/70  Pulse 85  Resp 16  Ht 5\' 7"  (1.702 m)  Wt 225 lb (102.059 kg)  BMI 35.23 kg/m2  Body mass index is 35.23 kg/(m^2).           Review of Systems denies chest pain, dyspnea on exertion, PND, orthopnea, hemoptysis.     Objective:   Physical Exam BP 119/70  Pulse 85  Resp 16  Ht 5\' 7"  (1.702 m)  Wt 225 lb (102.059 kg)  BMI 35.23 kg/m2  General well-developed alert female in no apparent stress  alert and oriented x3 Lungs no rhonchi or wheezing Right leg with large area of moderate ecchymosis measuring about 8 cm in diameter in the mid thigh. No hematoma noted. Mild tenderness to palpation. No distal edema. 3 posterior cells pedis pulse palpable.  Today I ordered a venous duplex exam the right leg which are reviewed and interpreted. The anterior accessory branch of the right great saphenous vein was totally closed up to an area 2.3 cm from the saphenofemoral junction and there is no DVT     Assessment:     Successful laser ablation right great saphenous vein-anterior accessory branch for severe reflux and painful varicosities    Plan:     Return on July 6 for similar procedure on the right small saphenous vein

## 2013-11-12 ENCOUNTER — Encounter (HOSPITAL_COMMUNITY)
Admission: RE | Admit: 2013-11-12 | Discharge: 2013-11-12 | Disposition: A | Discharge status: Home / Self Care | Payer: Medicare PPO | Source: Ambulatory Visit | Attending: Ophthalmology | Admitting: Ophthalmology

## 2013-11-12 ENCOUNTER — Other Ambulatory Visit: Payer: Self-pay

## 2013-11-12 ENCOUNTER — Encounter (HOSPITAL_COMMUNITY): Payer: Self-pay

## 2013-11-12 DIAGNOSIS — Z01812 Encounter for preprocedural laboratory examination: Secondary | ICD-10-CM | POA: Insufficient documentation

## 2013-11-12 DIAGNOSIS — Z01818 Encounter for other preprocedural examination: Secondary | ICD-10-CM | POA: Insufficient documentation

## 2013-11-12 DIAGNOSIS — Z01810 Encounter for preprocedural cardiovascular examination: Secondary | ICD-10-CM | POA: Insufficient documentation

## 2013-11-12 HISTORY — DX: Other specified postprocedural states: R11.2

## 2013-11-12 HISTORY — DX: Unspecified osteoarthritis, unspecified site: M19.90

## 2013-11-12 HISTORY — DX: Other specified postprocedural states: Z98.890

## 2013-11-12 LAB — BASIC METABOLIC PANEL
BUN: 13 mg/dL (ref 6–23)
CALCIUM: 9.6 mg/dL (ref 8.4–10.5)
CO2: 25 meq/L (ref 19–32)
CREATININE: 0.67 mg/dL (ref 0.50–1.10)
Chloride: 107 mEq/L (ref 96–112)
GFR calc Af Amer: >90 mL/min (ref >90)
GFR calc non Af Amer: 88 mL/min — ABNORMAL LOW (ref >90)
Glucose, Bld: 285 mg/dL — ABNORMAL HIGH (ref 70–99)
Potassium: 4.4 mEq/L (ref 3.7–5.3)
Sodium: 144 mEq/L (ref 137–147)

## 2013-11-12 LAB — HEMOGLOBIN AND HEMATOCRIT, BLOOD
HEMATOCRIT: 43.1 % (ref 36.0–46.0)
Hemoglobin: 14.9 g/dL (ref 12.0–15.0)

## 2013-11-12 NOTE — Patient Instructions (Signed)
Your procedure is scheduled on: 11/18/2013  Report to [facility] at  5  AM.  Call this number if you have problems the morning of surgery: [phone]   Do not eat food or drink liquids :After Midnight.      Take these medicines the morning of surgery with A SIP OF WATER: claritin, cozaar   Do not wear jewelry, make-up or nail polish.  Do not wear lotions, powders, or perfumes.   Do not shave 48 hours prior to surgery.  Do not bring valuables to the hospital.  Contacts, dentures or bridgework may not be worn into surgery.  Leave suitcase in the car. After surgery it may be brought to your room.  For patients admitted to the hospital, checkout time is 11:00 AM the day of discharge.   Patients discharged the day of surgery will not be allowed to drive home.  :     Please read over the following fact sheets that you were given: Coughing and Deep Breathing, Surgical Site Infection Prevention, Anesthesia Post-op Instructions and Care and Recovery After Surgery    Cataract A cataract is a clouding of the lens of the eye. When a lens becomes cloudy, vision is reduced based on the degree and nature of the clouding. Many cataracts reduce vision to some degree. Some cataracts make people more near-sighted as they develop. Other cataracts increase glare. Cataracts that are ignored and become worse can sometimes look white. The white color can be seen through the pupil. CAUSES   Aging. However, cataracts may occur at any age, even in newborns.   Certain drugs.   Trauma to the eye.   Certain diseases such as diabetes.   Specific eye diseases such as chronic inflammation inside the eye or a sudden attack of a rare form of glaucoma.   Inherited or acquired medical problems.  SYMPTOMS   Gradual, progressive drop in vision in the affected eye.   Severe, rapid visual loss. This most often happens when trauma is the cause.  DIAGNOSIS  To detect a cataract, an eye doctor examines the lens.  Cataracts are best diagnosed with an exam of the eyes with the pupils enlarged (dilated) by drops.  TREATMENT  For an early cataract, vision may improve by using different eyeglasses or stronger lighting. If that does not help your vision, surgery is the only effective treatment. A cataract needs to be surgically removed when vision loss interferes with your everyday activities, such as driving, reading, or watching TV. A cataract may also have to be removed if it prevents examination or treatment of another eye problem. Surgery removes the cloudy lens and usually replaces it with a substitute lens (intraocular lens, IOL).  At a time when both you and your doctor agree, the cataract will be surgically removed. If you have cataracts in both eyes, only one is usually removed at a time. This allows the operated eye to heal and be out of danger from any possible problems after surgery (such as infection or poor wound healing). In rare cases, a cataract may be doing damage to your eye. In these cases, your caregiver may advise surgical removal right away. The vast majority of people who have cataract surgery have better vision afterward. HOME CARE INSTRUCTIONS  If you are not planning surgery, you may be asked to do the following:  Use different eyeglasses.   Use stronger or brighter lighting.   Ask your eye doctor about reducing your medicine dose or changing medicines  if it is thought that a medicine caused your cataract. Changing medicines does not make the cataract go away on its own.   Become familiar with your surroundings. Poor vision can lead to injury. Avoid bumping into things on the affected side. You are at a higher risk for tripping or falling.   Exercise extreme care when driving or operating machinery.   Wear sunglasses if you are sensitive to bright light or experiencing problems with glare.  SEEK IMMEDIATE MEDICAL CARE IF:   You have a worsening or sudden vision loss.   You notice  redness, swelling, or increasing pain in the eye.   You have a fever.  Document Released: 05/16/2005 Document Revised: 05/05/2011 Document Reviewed: 01/07/2011 [facility] Patient Information 2012 [place].PATIENT INSTRUCTIONS POST-ANESTHESIA  IMMEDIATELY FOLLOWING SURGERY:  Do not drive or operate machinery for the first twenty four hours after surgery.  Do not make any important decisions for twenty four hours after surgery or while taking narcotic pain medications or sedatives.  If you develop intractable nausea and vomiting or a severe headache please notify your doctor immediately.  FOLLOW-UP:  Please make an appointment with your surgeon as instructed. You do not need to follow up with anesthesia unless specifically instructed to do so.  WOUND CARE INSTRUCTIONS (if applicable):  Keep a dry clean dressing on the anesthesia/puncture wound site if there is drainage.  Once the wound has quit draining you may leave it open to air.  Generally you should leave the bandage intact for twenty four hours unless there is drainage.  If the epidural site drains for more than 36-48 hours please call the anesthesia department.  QUESTIONS?:  Please feel free to call your physician or the hospital operator if you have any questions, and they will be happy to assist you.

## 2013-11-14 ENCOUNTER — Ambulatory Visit (INDEPENDENT_AMBULATORY_CARE_PROVIDER_SITE_OTHER): Payer: Medicare PPO | Admitting: Orthopedic Surgery

## 2013-11-14 VITALS — BP 125/66 | Ht 67.0 in | Wt 231.0 lb

## 2013-11-14 DIAGNOSIS — M654 Radial styloid tenosynovitis [de Quervain]: Secondary | ICD-10-CM

## 2013-11-14 DIAGNOSIS — M19049 Primary osteoarthritis, unspecified hand: Secondary | ICD-10-CM

## 2013-11-14 NOTE — Progress Notes (Signed)
Patient ID: Amanda Hopkins, female   DOB: February 27, 1946, 68 y.o.   MRN: [id number]  Chief Complaint  Patient presents with  . Follow-up    Recheck left thumb s/p injection    BP 125/66  Ht 5\' 7"  (1.702 m)  Wt 231 lb (104.781 kg)  BMI 36.17 kg/m2  Encounter Diagnoses  Name Primary?  [facility] DJD(carpometacarpal degenerative joint disease), localized primary Yes  . De Quervain's syndrome (tenosynovitis)     Both wrists are much improved after we injected the left wrist that pain resolved as well. No tenderness no swelling, negative Finkelstein's test.  Followup as needed

## 2013-11-18 ENCOUNTER — Ambulatory Visit (HOSPITAL_COMMUNITY)
Admission: RE | Admit: 2013-11-18 | Discharge: 2013-11-18 | Disposition: A | Discharge status: Home / Self Care | Payer: Medicare PPO | Source: Ambulatory Visit | Attending: Ophthalmology | Admitting: Ophthalmology

## 2013-11-18 ENCOUNTER — Encounter (HOSPITAL_COMMUNITY): Payer: Self-pay | Admitting: *Deleted

## 2013-11-18 ENCOUNTER — Encounter (HOSPITAL_COMMUNITY): Payer: Medicare PPO | Admitting: Anesthesiology

## 2013-11-18 ENCOUNTER — Encounter (HOSPITAL_COMMUNITY)
Admission: RE | Disposition: A | Discharge status: Home / Self Care | Payer: Self-pay | Source: Ambulatory Visit | Attending: Ophthalmology

## 2013-11-18 ENCOUNTER — Ambulatory Visit (HOSPITAL_COMMUNITY): Payer: Medicare PPO | Admitting: Anesthesiology

## 2013-11-18 DIAGNOSIS — Z79899 Other long term (current) drug therapy: Secondary | ICD-10-CM | POA: Insufficient documentation

## 2013-11-18 DIAGNOSIS — E119 Type 2 diabetes mellitus without complications: Secondary | ICD-10-CM | POA: Insufficient documentation

## 2013-11-18 DIAGNOSIS — H2589 Other age-related cataract: Secondary | ICD-10-CM | POA: Insufficient documentation

## 2013-11-18 DIAGNOSIS — K219 Gastro-esophageal reflux disease without esophagitis: Secondary | ICD-10-CM | POA: Insufficient documentation

## 2013-11-18 DIAGNOSIS — I10 Essential (primary) hypertension: Secondary | ICD-10-CM | POA: Insufficient documentation

## 2013-11-18 DIAGNOSIS — E669 Obesity, unspecified: Secondary | ICD-10-CM | POA: Insufficient documentation

## 2013-11-18 HISTORY — PX: CATARACT EXTRACTION W/PHACO: SHX586

## 2013-11-18 LAB — GLUCOSE, CAPILLARY: Glucose-Capillary: 177 mg/dL — ABNORMAL HIGH (ref 70–99)

## 2013-11-18 SURGERY — PHACOEMULSIFICATION, CATARACT, WITH IOL INSERTION
Anesthesia: Monitor Anesthesia Care | Site: Eye | Laterality: Right

## 2013-11-18 MED ORDER — MIDAZOLAM HCL 2 MG/2ML IJ SOLN
INTRAMUSCULAR | Status: AC
  Filled 2013-11-18: qty 2

## 2013-11-18 MED ORDER — EPINEPHRINE HCL 1 MG/ML IJ SOLN
INTRAMUSCULAR | Status: AC
  Filled 2013-11-18: qty 1

## 2013-11-18 MED ORDER — CYCLOPENTOLATE-PHENYLEPHRINE OP SOLN OPTIME - NO CHARGE
OPHTHALMIC | Status: AC
  Filled 2013-11-18: qty 2

## 2013-11-18 MED ORDER — TETRACAINE HCL 0.5 % OP SOLN
OPHTHALMIC | Status: AC
  Filled 2013-11-18: qty 2

## 2013-11-18 MED ORDER — FENTANYL CITRATE 0.05 MG/ML IJ SOLN: 25.0000 ug | INTRAMUSCULAR | Status: DC | PRN

## 2013-11-18 MED ORDER — POVIDONE-IODINE 5 % OP SOLN
OPHTHALMIC | Status: DC | PRN
  Administered 2013-11-18: 1 via OPHTHALMIC

## 2013-11-18 MED ORDER — CYCLOPENTOLATE-PHENYLEPHRINE 0.2-1 % OP SOLN
1.0000 [drp] | OPHTHALMIC | Status: AC
  Administered 2013-11-18 (×3): 1 [drp] via OPHTHALMIC

## 2013-11-18 MED ORDER — BSS IO SOLN
INTRAOCULAR | Status: DC | PRN
  Administered 2013-11-18: 15 mL via INTRAOCULAR

## 2013-11-18 MED ORDER — FENTANYL CITRATE 0.05 MG/ML IJ SOLN
25.0000 ug | INTRAMUSCULAR | Status: AC
Start: 2013-11-18 — End: 2013-11-18
  Administered 2013-11-18 (×2): 25 ug via INTRAVENOUS

## 2013-11-18 MED ORDER — ONDANSETRON HCL 4 MG/2ML IJ SOLN: 4.0000 mg | Freq: Once | INTRAMUSCULAR | Status: DC | PRN

## 2013-11-18 MED ORDER — EPINEPHRINE HCL 1 MG/ML IJ SOLN
INTRAOCULAR | Status: DC | PRN
  Administered 2013-11-18: 12:00:00

## 2013-11-18 MED ORDER — TETRACAINE HCL 0.5 % OP SOLN
1.0000 [drp] | OPHTHALMIC | Status: AC
  Administered 2013-11-18 (×3): 1 [drp] via OPHTHALMIC

## 2013-11-18 MED ORDER — LIDOCAINE HCL 3.5 % OP GEL
OPHTHALMIC | Status: AC
  Filled 2013-11-18: qty 1

## 2013-11-18 MED ORDER — LACTATED RINGERS IV SOLN
INTRAVENOUS | Status: DC | PRN
  Administered 2013-11-18: 11:00:00 via INTRAVENOUS

## 2013-11-18 MED ORDER — LIDOCAINE HCL (PF) 1 % IJ SOLN
INTRAMUSCULAR | Status: DC | PRN
  Administered 2013-11-18: .4 mL

## 2013-11-18 MED ORDER — PROVISC 10 MG/ML IO SOLN
INTRAOCULAR | Status: DC | PRN
  Administered 2013-11-18: 0.85 mL via INTRAOCULAR

## 2013-11-18 MED ORDER — PHENYLEPHRINE HCL 2.5 % OP SOLN
OPHTHALMIC | Status: AC
  Filled 2013-11-18: qty 15

## 2013-11-18 MED ORDER — PHENYLEPHRINE HCL 2.5 % OP SOLN
1.0000 [drp] | OPHTHALMIC | Status: AC
  Administered 2013-11-18 (×3): 1 [drp] via OPHTHALMIC

## 2013-11-18 MED ORDER — LACTATED RINGERS IV SOLN
INTRAVENOUS | Status: DC
  Administered 2013-11-18: 1000 mL via INTRAVENOUS

## 2013-11-18 MED ORDER — MIDAZOLAM HCL 2 MG/2ML IJ SOLN
1.0000 mg | INTRAMUSCULAR | Status: DC | PRN
  Administered 2013-11-18: 2 mg via INTRAVENOUS

## 2013-11-18 MED ORDER — FENTANYL CITRATE 0.05 MG/ML IJ SOLN
INTRAMUSCULAR | Status: AC
  Filled 2013-11-18: qty 2

## 2013-11-18 MED ORDER — LIDOCAINE 3.5 % OP GEL OPTIME - NO CHARGE
OPHTHALMIC | Status: DC | PRN
  Administered 2013-11-18: 2 [drp] via OPHTHALMIC

## 2013-11-18 MED ORDER — NEOMYCIN-POLYMYXIN-DEXAMETH 3.5-10000-0.1 OP SUSP
OPHTHALMIC | Status: AC
  Filled 2013-11-18: qty 5

## 2013-11-18 MED ORDER — LIDOCAINE HCL (PF) 1 % IJ SOLN
INTRAMUSCULAR | Status: AC
  Filled 2013-11-18: qty 2

## 2013-11-18 MED ORDER — ONDANSETRON HCL 4 MG/2ML IJ SOLN
INTRAMUSCULAR | Status: AC
  Filled 2013-11-18: qty 2

## 2013-11-18 MED ORDER — NEOMYCIN-POLYMYXIN-DEXAMETH 3.5-10000-0.1 OP SUSP
OPHTHALMIC | Status: DC | PRN
  Administered 2013-11-18: 2 [drp] via OPHTHALMIC

## 2013-11-18 MED ORDER — TETRACAINE HCL 0.5 % OP SOLN
OPHTHALMIC | Status: AC
Start: 2013-11-18 — End: 2013-11-18
  Filled 2013-11-18: qty 2

## 2013-11-18 MED ORDER — LIDOCAINE HCL 3.5 % OP GEL: 1.0000 "application " | Freq: Once | OPHTHALMIC | Status: DC

## 2013-11-18 SURGICAL SUPPLY — 34 items
CAPSULAR TENSION RING-AMO (OPHTHALMIC RELATED) IMPLANT
CLOTH BEACON ORANGE TIMEOUT ST (SAFETY) ×3 IMPLANT
EYE SHIELD UNIVERSAL CLEAR (GAUZE/BANDAGES/DRESSINGS) ×3 IMPLANT
GLOVE BIO SURGEON STRL SZ 6.5 (GLOVE) IMPLANT
GLOVE BIO SURGEONS STRL SZ 6.5 (GLOVE)
GLOVE BIOGEL PI IND STRL 6.5 (GLOVE) IMPLANT
GLOVE BIOGEL PI IND STRL 7.0 (GLOVE) IMPLANT
GLOVE BIOGEL PI IND STRL 7.5 (GLOVE) IMPLANT
GLOVE BIOGEL PI INDICATOR 6.5 (GLOVE)
GLOVE BIOGEL PI INDICATOR 7.0 (GLOVE)
GLOVE BIOGEL PI INDICATOR 7.5 (GLOVE)
GLOVE ECLIPSE 6.5 STRL STRAW (GLOVE) IMPLANT
GLOVE ECLIPSE 7.0 STRL STRAW (GLOVE) IMPLANT
GLOVE ECLIPSE 7.5 STRL STRAW (GLOVE) IMPLANT
GLOVE EXAM NITRILE LRG STRL (GLOVE) IMPLANT
GLOVE EXAM NITRILE MD LF STRL (GLOVE) IMPLANT
GLOVE SKINSENSE NS SZ6.5 (GLOVE)
GLOVE SKINSENSE NS SZ7.0 (GLOVE)
GLOVE SKINSENSE STRL SZ6.5 (GLOVE) IMPLANT
GLOVE SKINSENSE STRL SZ7.0 (GLOVE) IMPLANT
GLOVE SS N UNI LF 7.0 STRL (GLOVE) ×6 IMPLANT
KIT VITRECTOMY (OPHTHALMIC RELATED) IMPLANT
PAD ARMBOARD 7.5X6 YLW CONV (MISCELLANEOUS) ×3 IMPLANT
PROC W NO LENS (INTRAOCULAR LENS)
PROC W SPEC LENS (INTRAOCULAR LENS)
PROCESS W NO LENS (INTRAOCULAR LENS) IMPLANT
PROCESS W SPEC LENS (INTRAOCULAR LENS) IMPLANT
RING MALYGIN (MISCELLANEOUS) IMPLANT
SIGHTPATH CAT PROC W REG LENS (Ophthalmic Related) ×3 IMPLANT
SYR TB 1ML LL NO SAFETY (SYRINGE) ×3 IMPLANT
TAPE SURG TRANSPORE 1 IN (GAUZE/BANDAGES/DRESSINGS) ×1 IMPLANT
TAPE SURGICAL TRANSPORE 1 IN (GAUZE/BANDAGES/DRESSINGS) ×2
VISCOELASTIC ADDITIONAL (OPHTHALMIC RELATED) IMPLANT
WATER STERILE IRR 250ML POUR (IV SOLUTION) ×3 IMPLANT

## 2013-11-18 NOTE — Transfer of Care (Signed)
Immediate Anesthesia Transfer of Care Note  Patient: Amanda Hopkins  Procedure(s) Performed: Procedure(s) (LRB): CATARACT EXTRACTION PHACO AND INTRAOCULAR LENS PLACEMENT (IOC) (Right)  Patient Location: Shortstay  Anesthesia Type: MAC  Level of Consciousness: awake  Airway & Oxygen Therapy: Patient Spontanous Breathing   Post-op Assessment: Report given to PACU RN, Post -op Vital signs reviewed and stable and Patient moving all extremities  Post vital signs: Reviewed and stable  Complications: No apparent anesthesia complications

## 2013-11-18 NOTE — H&P (Signed)
I have reviewed the H&P, the patient was re-examined, and I have identified no interval changes in medical condition and plan of care since the history and physical of record  

## 2013-11-18 NOTE — Anesthesia Postprocedure Evaluation (Signed)
  Anesthesia Post-op Note  Patient: Amanda Hopkins  Procedure(s) Performed: Procedure(s) (LRB): CATARACT EXTRACTION PHACO AND INTRAOCULAR LENS PLACEMENT (IOC) (Right)  Patient Location:  Short Stay  Anesthesia Type: MAC  Level of Consciousness: awake  Airway and Oxygen Therapy: Patient Spontanous Breathing  Post-op Pain: none  Post-op Assessment: Post-op Vital signs reviewed, Patient's Cardiovascular Status Stable, Respiratory Function Stable, Patent Airway, No signs of Nausea or vomiting and Pain level controlled  Post-op Vital Signs: Reviewed and stable  Complications: No apparent anesthesia complications

## 2013-11-18 NOTE — Anesthesia Preprocedure Evaluation (Signed)
Anesthesia Evaluation  Patient identified by MRN, date of birth, ID band Patient awake    Reviewed: Allergy & Precautions, H&P , NPO status   History of Anesthesia Complications (+) PONV and history of anesthetic complications  Airway Mallampati: III TM Distance: >3 FB Neck ROM: Full    Dental  (+) Teeth Intact   Pulmonary  breath sounds clear to auscultation        Cardiovascular hypertension, Pt. on medications + Peripheral Vascular Disease + dysrhythmias (palpitations) Rhythm:Regular Rate:Normal     Neuro/Psych    GI/Hepatic GERD-  Controlled and Medicated,  Endo/Other  diabetes, Type 2, Oral Hypoglycemic AgentsMorbid obesity  Renal/GU      Musculoskeletal   Abdominal   Peds  Hematology   Anesthesia Other Findings   Reproductive/Obstetrics                           Anesthesia Physical Anesthesia Plan  ASA: III  Anesthesia Plan: MAC   Post-op Pain Management:    Induction: Intravenous  Airway Management Planned: Nasal Cannula  Additional Equipment:   Intra-op Plan:   Post-operative Plan:   Informed Consent: I have reviewed the patients History and Physical, chart, labs and discussed the procedure including the risks, benefits and alternatives for the proposed anesthesia with the patient or authorized representative who has indicated his/her understanding and acceptance.     Plan Discussed with:   Anesthesia Plan Comments:         Anesthesia Quick Evaluation

## 2013-11-18 NOTE — Anesthesia Procedure Notes (Signed)
Procedure Name: MAC Date/Time: 11/18/2013 11:58 AM Performed by: Vista Deck Pre-anesthesia Checklist: Patient identified, Emergency Drugs available, Suction available, Timeout performed and Patient being monitored Patient Re-evaluated:Patient Re-evaluated prior to inductionOxygen Delivery Method: Nasal Cannula

## 2013-11-18 NOTE — Op Note (Signed)
Date of Admission: 11/18/2013  Date of Surgery: 11/18/2013   Pre-Op Dx: Cataract Right Eye  Post-Op Dx: Cataract Right  Eye,  Dx Code 366.19  Surgeon: Tonny Branch, M.D.  Assistants: None  Anesthesia: Topical with MAC  Indications: Painless, progressive loss of vision with compromise of daily activities.  Surgery: Cataract Extraction with Intraocular lens Implant Right Eye  Discription: The patient had dilating drops and viscous lidocaine placed into the Right eye in the pre-op holding area. After transfer to the operating room, a time out was performed. The patient was then prepped and draped. Beginning with a 48 degree blade a paracentesis port was made at the surgeon's 2 o'clock position. The anterior chamber was then filled with 1% non-preserved lidocaine. This was followed by filling the anterior chamber with Provisc.  A 2.31mm keratome blade was used to make a clear corneal incision at the temporal limbus.  A bent cystatome needle was used to create a continuous tear capsulotomy. Hydrodissection was performed with balanced salt solution on a Fine canula. The lens nucleus was then removed using the phacoemulsification handpiece. Residual cortex was removed with the I&A handpiece. The anterior chamber and capsular bag were refilled with Provisc. A posterior chamber intraocular lens was placed into the capsular bag with it's injector. The implant was positioned with the Kuglan hook. The Provisc was then removed from the anterior chamber and capsular bag with the I&A handpiece. Stromal hydration of the main incision and paracentesis port was performed with BSS on a Fine canula. The wounds were tested for leak which was negative. The patient tolerated the procedure well. There were no operative complications. The patient was then transferred to the recovery room in stable condition.  Complications: None  Specimen: None  EBL: None  Prosthetic device: Hoya iSert 250, power 20.0 D, SN  [id number].

## 2013-11-19 ENCOUNTER — Encounter (HOSPITAL_COMMUNITY): Payer: Self-pay | Admitting: Ophthalmology

## 2013-11-28 ENCOUNTER — Encounter: Payer: Self-pay | Admitting: Vascular Surgery

## 2013-12-02 ENCOUNTER — Ambulatory Visit (INDEPENDENT_AMBULATORY_CARE_PROVIDER_SITE_OTHER): Payer: Medicare PPO | Admitting: Vascular Surgery

## 2013-12-02 ENCOUNTER — Encounter: Payer: Self-pay | Admitting: Vascular Surgery

## 2013-12-02 VITALS — BP 130/79 | HR 86 | Resp 16 | Ht 67.0 in | Wt 225.0 lb

## 2013-12-02 DIAGNOSIS — I83893 Varicose veins of bilateral lower extremities with other complications: Secondary | ICD-10-CM

## 2013-12-02 NOTE — Progress Notes (Signed)
Subjective:     Patient ID: Amanda Hopkins, female   DOB: 05-Sep-1945, 68 y.o.   MRN: [id number]  HPI this 68 year old female had laser ablation of the right small saphenous vein from the midcalf to near the saphena popliteal junction performed under local tumescent anesthesia. She tolerated the procedure well. A total of 900 J of energy was utilized.   Review of Systems     Objective:   Physical Exam BP 130/79  Pulse 86  Resp 16  Ht 5\' 7"  (1.702 m)  Wt 225 lb (102.059 kg)  BMI 35.23 kg/m2        Assessment:     Well-tolerated laser ablation right small saphenous vein performed for gross reflux of painful varicosities    Plan:     Return in one week for a venous duplex exam to confirm closure right small saphenous vein

## 2013-12-02 NOTE — Progress Notes (Signed)
   Laser Ablation Procedure      Date: 12/02/2013    Amanda Hopkins DOB:07-29-1945  Consent signed: Yes  Surgeon:J.D. Kellie Simmering  Procedure: Laser Ablation: right Small Saphenous Vein  BP 130/79  Pulse 86  Resp 16  Ht 5\' 7"  (1.702 m)  Wt 225 lb (102.059 kg)  BMI 35.23 kg/m2  Start time: 11:10   End time: 11:50  Tumescent Anesthesia: 200 cc 0.9% NaCl with 50 cc Lidocaine HCL with 1% Epi and 15 cc 8.4% NaHCO3  Local Anesthesia: 6 cc Lidocaine HCL and NaHCO3 (ratio 2:1)  Pulsed mode: 15 watts, 548ms delay, 1.0 duration Total energy: 994, total pulses: 67, total time: 1:06     Patient tolerated procedure well: Yes  Notes:   Description of Procedure:  After marking the course of the saphenous vein and the secondary varicosities in the standing position, the patient was placed on the operating table in the prone position, and the right leg was prepped and draped in sterile fashion. Local anesthetic was administered, and under ultrasound guidance the saphenous vein was accessed with a micro needle and guide wire; then the micro puncture sheath was placed. A guide wire was inserted to the saphenopopliteal junction, followed by a 5 french sheath.  The position of the sheath and then the laser fiber below the junction was confirmed using the ultrasound and visualization of the aiming beam.  Tumescent anesthesia was administered along the course of the saphenous vein using ultrasound guidance. Protective laser glasses were placed on the patient, and the laser was fired at 15 watt pulsed mode advancing 1-2 mm per sec.  For a total of 994 joules.  A steri strip was applied to the puncture site.    ABD pads and thigh high compression stockings were applied.  Ace wrap bandages were applied over the phlebectomy sites and at the top of the saphenopopliteal junction.  Blood loss was less than 15 cc.  The patient ambulated out of the operating room having tolerated the procedure well.

## 2013-12-03 ENCOUNTER — Telehealth: Payer: Self-pay | Admitting: *Deleted

## 2013-12-03 NOTE — Telephone Encounter (Signed)
Pt had a good night. No problems. Following all instructions.

## 2013-12-09 ENCOUNTER — Encounter: Payer: Self-pay | Admitting: Vascular Surgery

## 2013-12-10 ENCOUNTER — Ambulatory Visit (INDEPENDENT_AMBULATORY_CARE_PROVIDER_SITE_OTHER): Payer: Medicare PPO | Admitting: Vascular Surgery

## 2013-12-10 ENCOUNTER — Encounter: Payer: Self-pay | Admitting: Vascular Surgery

## 2013-12-10 ENCOUNTER — Ambulatory Visit (HOSPITAL_COMMUNITY)
Admission: RE | Admit: 2013-12-10 | Discharge: 2013-12-10 | Disposition: A | Discharge status: Home / Self Care | Payer: Medicare PPO | Source: Ambulatory Visit | Attending: Vascular Surgery | Admitting: Vascular Surgery

## 2013-12-10 VITALS — BP 120/46 | HR 83 | Resp 16 | Ht 67.0 in | Wt 225.0 lb

## 2013-12-10 DIAGNOSIS — I83893 Varicose veins of bilateral lower extremities with other complications: Secondary | ICD-10-CM

## 2013-12-10 NOTE — Progress Notes (Signed)
Subjective:     Patient ID: Amanda Hopkins, female   DOB: April 21, 1946, 68 y.o.   MRN: [id number]  HPI this 68 year old female returns 1 week post laser ablation right small saphenous vein. She has previously had laser ablation of the anterior accessory branch of the right great saphenous vein. She is having some moderate discomfort in the right calf where the ablation was performed. She is not noticing any distal edema. She has worn elastic compression stockings as instructed in taking ibuprofen.  Past Medical History  Diagnosis Date  . Diabetes mellitus without complication   . Hyperlipidemia   . Reflux esophagitis   . Hypertension   . Palpitations   . Obesity   . PONV (postoperative nausea and vomiting)   . Arthritis     History  Substance Use Topics  . Smoking status: Never Smoker   . Smokeless tobacco: Not on file  . Alcohol Use: No    Family History  Problem Relation Age of Onset  . Hypertension Mother     Allergies  Allergen Reactions  . Erythromycin     Current outpatient prescriptions:Insulin Detemir (LEVEMIR FLEXPEN) 100 UNIT/ML SOPN, Inject 15 Units into the skin at bedtime. , Disp: , Rfl: ;  Liraglutide (VICTOZA) 18 MG/3ML SOPN, Inject into the skin., Disp: , Rfl: ;  loratadine (CLARITIN) 10 MG tablet, Take 10 mg by mouth daily as needed for allergies., Disp: , Rfl: ;  losartan (COZAAR) 25 MG tablet, Take 25 mg by mouth at bedtime. , Disp: , Rfl:  metFORMIN (GLUCOPHAGE) 500 MG tablet, Take by mouth 2 (two) times daily with a meal., Disp: , Rfl: ;  rosuvastatin (CRESTOR) 10 MG tablet, Take 10 mg by mouth at bedtime. , Disp: , Rfl:   BP 120/46  Pulse 83  Resp 16  Ht 5\' 7"  (1.702 m)  Wt 225 lb (102.059 kg)  BMI 35.23 kg/m2  Body mass index is 35.23 kg/(m^2).           Review of Systems denies chest pain, dyspnea on exertion, PND, orthopnea, hemoptysis.     Objective:   Physical Exam BP 120/46  Pulse 83  Resp 16  Ht 5\' 7"  (1.702 m)  Wt 225 lb  (102.059 kg)  BMI 35.23 kg/m2  General well-developed well-nourished female no apparent stress alert and oriented x3 Lungs no rhonchi or wheezing Heart a vascular regular rhythm no murmurs Right leg with mild tenderness along course of small saphenous vein. No distal edema noted. Nobles he varicosities noted at this time. 3 posterior cells pedis pulse palpable.  Today I ordered a venous duplex exam of the right leg which are reviewed and interpreted. There is no DVT. The the small saphenous vein is totally closed and there is no DVT     Assessment:     Successful laser ablation right small saphenous vein and anterior accessory branch right great saphenous vein with no DVT. Bulging varicosities much less prominent.    Plan:     Do not think patient will need further procedures at this point. If she decides that she is having pain in the residual varicosities she will be in touch with [place] to see her again in 3 months.

## 2014-05-15 ENCOUNTER — Encounter: Payer: Self-pay | Admitting: Nutrition

## 2014-05-15 ENCOUNTER — Encounter: Payer: Medicare PPO | Attending: "Endocrinology | Admitting: Nutrition

## 2014-05-15 VITALS — Ht 67.0 in | Wt 235.0 lb

## 2014-05-15 DIAGNOSIS — E118 Type 2 diabetes mellitus with unspecified complications: Secondary | ICD-10-CM | POA: Diagnosis present

## 2014-05-15 DIAGNOSIS — E1165 Type 2 diabetes mellitus with hyperglycemia: Secondary | ICD-10-CM

## 2014-05-15 DIAGNOSIS — Z794 Long term (current) use of insulin: Secondary | ICD-10-CM | POA: Diagnosis not present

## 2014-05-15 DIAGNOSIS — Z713 Dietary counseling and surveillance: Secondary | ICD-10-CM | POA: Diagnosis not present

## 2014-05-15 DIAGNOSIS — IMO0002 Reserved for concepts with insufficient information to code with codable children: Secondary | ICD-10-CM

## 2014-05-15 NOTE — Progress Notes (Signed)
  Medical Nutrition Therapy:  Appt start time: 8185 end time:  1630.  Assessment:  Primary concerns today: Diabetes follow up. Currently taking Victoza, Levemir 20 units daily, and 1000 mg of extended release of Metformin.. She has not had any weight loss since on Victoza. She has gained 3 lbs since she saw Dr. Dorris Fetch. Appears resistant to make necessary changes with diet, exercise for needed weight loss, lower cholesterol and improved blood sugars. Her A1c has increased from 7.4% to 8.4%. She doesn't know why she is gaining weight.  Preferred Learning Style:     No preference indicated   Learning Readiness:  Contemplating  MEDICATIONS: see list   DIETARY INTAKE:  24-hr recall:  Difficult to get a food diary. . She notes she may eat eggs, toast or oatmeal/cereal for breakfast with 2% milk.  Lunch usually a sandwich Dinner: meat and some vegetables.  Usual physical activity: walks a mile every now and then but not on a regular basis or consistent enough to create weight loss.  Estimated energy needs: 1500 calories 170 g carbohydrates 112 g protein 42 g fat  Progress Towards Goal(s):  In progress.   Nutritional Diagnosis:  NB-1.1 Food and nutrition-related knowledge deficit As related to Diabetes and obesity.  As evidenced by A1C 8.4%.    Intervention:  Nutrition counseling for diabetes and weight loss using My Plate, Carb Counting, meal planning, target ranges for blood sugars and healthy ways to lose weight and improve blood sugars  Goals:  Follow Diabetes Meal Plan/My Plate as instructed  Eat 3 meals and avoid snacks between meals.  Limit carbohydrate intake to 30-45 grams carbohydrate/meal  Avoid snacks between meals.  Reduce milk to 1% from 2% for needed weight loss.  Add lean protein foods to meals  Monitor glucose levels as instructed by your doctor  Aim for 45-60 mins of physical activity at least 4 days a week. Exercising 1-2 times per week for only a mile  will not provide weight loss  Bring food record and glucose log to your next nutrition visit  Increase low carb vegetables to 2 servings at lunch and dinner daily.  Teaching Method Utilized:   Visual Auditory Hands on  Handouts given during visit include: The Plate Method Carb Counting and Food Label handouts Meal Plan Card  Barriers to learning/adherence to lifestyle change: none  Demonstrated degree of understanding via:  Teach Back   Monitoring/Evaluation:  Dietary intake, exercise, meal planning, SBG, and body weight in 1 month(s).

## 2014-05-19 NOTE — Patient Instructions (Signed)
   Follow Diabetes Meal Plan/My Plate as instructed  Eat 3 meals and avoid snacks between meals.  Limit carbohydrate intake to 30-45 grams carbohydrate/meal  Avoid snacks between meals.  Reduce milk to 1% from 2% for needed weight loss.  Add lean protein foods to meals  Monitor glucose levels as instructed by your doctor  Aim for 45-60 mins of physical activity at least 4 days a week. Exercising 1-2 times per week for only a mile will not provide weight loss  Bring food record and glucose log to your next nutrition visit  Increase low carb vegetables to 2 servings at lunch and dinner daily.

## 2014-06-04 ENCOUNTER — Encounter: Payer: Self-pay | Admitting: Nutrition

## 2014-06-04 ENCOUNTER — Encounter: Payer: Medicare PPO | Attending: "Endocrinology | Admitting: Nutrition

## 2014-06-04 VITALS — Ht 67.0 in | Wt 231.0 lb

## 2014-06-04 DIAGNOSIS — E118 Type 2 diabetes mellitus with unspecified complications: Secondary | ICD-10-CM | POA: Diagnosis not present

## 2014-06-04 DIAGNOSIS — Z713 Dietary counseling and surveillance: Secondary | ICD-10-CM | POA: Insufficient documentation

## 2014-06-04 DIAGNOSIS — Z794 Long term (current) use of insulin: Secondary | ICD-10-CM | POA: Diagnosis not present

## 2014-06-04 DIAGNOSIS — E1165 Type 2 diabetes mellitus with hyperglycemia: Secondary | ICD-10-CM

## 2014-06-04 DIAGNOSIS — IMO0002 Reserved for concepts with insufficient information to code with codable children: Secondary | ICD-10-CM

## 2014-06-04 NOTE — Progress Notes (Signed)
  Medical Nutrition Therapy:  Appt start time: 1696 end time:  1630.  Assessment:  Primary concerns today: Diabetes follow up. Hasn't changed milk or exercise. Has made efforts with diet.  Has cut out snacks, eating more fresh fruits and vegetables. Lost 4 lbs. Increased water intake. FBS 100-130's and before dinner 80-100's  Currently taking Victoza, Levemir 20 units daily, and 1000 mg of extended release of Metformin. Brought food journal and BS and diet look much better.  Preferred Learning Style:     No preference indicated   Learning Readiness:  Contemplating  MEDICATIONS: see list   DIETARY INTAKE:  24-hr recall:  B) Cereal and milk 2% or eggs and toast. Lunch) beef tips, green beans, carrots, water, piece of fruit Dinner) Beef, onions, peppers, 1 slice bread and stewed apples, water She has cut out snacks between meals. Increased fresh fruit and raw vegetables and drinking more water. Hasn't increase physical activity yet.  Usual physical activity: walks a mile every now   Estimated energy needs: 1500 calories 170 g carbohydrates 112 g protein 42 g fat  Progress Towards Goal(s):  In progress.   Nutritional Diagnosis:  NB-1.1 Food and nutrition-related knowledge deficit As related to Diabetes and obesity.  As evidenced by A1C 8.4%.    Intervention:  Nutrition counseling for diabetes and weight loss using My Plate, Carb Counting, meal planning, target ranges for blood sugars and healthy ways to lose weight and improve blood sugars  Goals:   Keep up the GREAT JOB!!! Congrats on the 4 lbs weight loss.       Follow Diabetes Meal Plan/My Plate as instructed  Eat 3 meals and avoid snacks between meals.  Make sure you are  getting 30-45 grams carbohydrate/meal  Avoid snacks between meals.  Reduce milk to 1% from 2% for needed weight loss.  Add lean protein foods to meals  Monitor glucose levels as instructed by your doctor  Aim for 45-60 mins of physical  activity at least 4 days a week. Exercising 1-2 times per week for only a mile will not provide weight loss  Bring food record and glucose log to your next nutrition visit  Increase low carb vegetables to 2 servings at lunch and dinner daily.   Goal: Get A1C to 7.5% in three months.  Teaching Method Utilized:   Visual Auditory Hands on  Handouts given during visit include: The Plate Method Carb Counting and Food Label handouts Meal Plan Card  Barriers to learning/adherence to lifestyle change: none  Demonstrated degree of understanding via:  Teach Back   Monitoring/Evaluation:  Dietary intake, exercise, meal planning, SBG, and body weight in 1 month(s).

## 2014-06-04 NOTE — Patient Instructions (Signed)
Goals:   Keep up the GREAT JOB!!! COngrats on the 4 lbs weight loss.       Follow Diabetes Meal Plan/My Plate as instructed  Eat 3 meals and avoid snacks between meals.  Make sure you are  getting 30-45 grams carbohydrate/meal  Avoid snacks between meals.  Reduce milk to 1% from 2% for needed weight loss.  Add lean protein foods to meals  Monitor glucose levels as instructed by your doctor  Aim for 45-60 mins of physical activity at least 4 days a week. Exercising 1-2 times per week for only a mile will not provide weight loss  Bring food record and glucose log to your next nutrition visit  Increase low carb vegetables to 2 servings at lunch and dinner daily.   Goal: Get A1C to 7.5% in three months.

## 2014-08-15 LAB — HM DIABETES EYE EXAM

## 2014-09-03 ENCOUNTER — Encounter: Payer: Self-pay | Admitting: Nutrition

## 2014-09-03 ENCOUNTER — Encounter: Payer: Medicare PPO | Attending: "Endocrinology | Admitting: Nutrition

## 2014-09-03 VITALS — Ht 67.0 in | Wt 230.0 lb

## 2014-09-03 DIAGNOSIS — Z713 Dietary counseling and surveillance: Secondary | ICD-10-CM | POA: Insufficient documentation

## 2014-09-03 DIAGNOSIS — E118 Type 2 diabetes mellitus with unspecified complications: Secondary | ICD-10-CM | POA: Diagnosis present

## 2014-09-03 DIAGNOSIS — Z794 Long term (current) use of insulin: Secondary | ICD-10-CM | POA: Diagnosis not present

## 2014-09-03 NOTE — Patient Instructions (Signed)
Goals:   Keep up the GREAT JOB!!! Congrats on the 1 lbs weight loss for a total of 5 lbs lost all together!.       Follow Diabetes Meal Plan/My Plate as instructed       See about getting referred to a podiatrist. Walk as much as you can to help with further weight loss.. Cut out snacks between meals. Stick to 2-3 carb choices per meal. Eat lower carb veggies if needed between meals. Lose 2-3 lbs per month. Follow up with me as needed. Keep A1C 7.5 or less.

## 2014-09-03 NOTE — Progress Notes (Signed)
  Medical Nutrition Therapy:  Appt start time: 930 end time:  945.  Assessment:  Primary concerns today: Diabetes follow up. Lost 1 lb. Complians of her bottom of her hurting and makes it difficult to do a lot of walking. Would like referral to a podiatrist. Most recent A1C 7.4%, which is down from 8.$ three months ago. Levemir 25 units at night. Victoza and Metformin. Denies any low blood sugars.  BS 105-116 in the mornings.   Diet is doing pretty good.  Needs to avoid snacks between meals. Getting in some walking but not a lot due to problems with her feet. Recommend referral to podiatrist.  Preferred Learning Style:     No preference indicated   Learning Readiness:  Contemplating  MEDICATIONS: see list   DIETARY INTAKE:  24-hr recall:  B) Cereal and milk 2% or eggs and toast. Lunch) Steak sandwich, peppers,tomatoes, on a bun Snack: yogurt Dinner) Spaghetti, veggies salad and roll and cantelope, Crystal Light tea  She has cut out snacks between meals. Increased fresh fruit and raw vegetables and drinking more water. Usual physical activity: walks a mile every now   Estimated energy needs: 1500 calories 170 g carbohydrates 112 g protein 42 g fat  Progress Towards Goal(s):  In progress.   Nutritional Diagnosis:  NB-1.1 Food and nutrition-related knowledge deficit As related to Diabetes and obesity.  As evidenced by A1C 8.4%.    Intervention:  Nutrition counseling for diabetes and weight loss using My Plate, Carb Counting, meal planning, target ranges for blood sugars and healthy ways to lose weight and improve blood sugars  Goals:   Keep up the GREAT JOB!!! Congrats on the 1 lbs weight loss for a total of 5 lbs lost all together!.       Follow Diabetes Meal Plan/My Plate as instructed       See about getting referred to a podiatrist. Walk as much as you can to help with further weight loss.. Cut out snacks between meals. Stick to 2-3 carb choices per meal. Eat  lower carb veggies if needed between meals. Lose 2-3 lbs per month. Follow up with me as needed. Keep A1C 7.5 or less.   Teaching Method Utilized:   Visual Auditory Hands on  Handouts given during visit include: The Plate Method Carb Counting and Food Label handouts Meal Plan Card  Barriers to learning/adherence to lifestyle change: none  Demonstrated degree of understanding via:  Teach Back   Monitoring/Evaluation:  Dietary intake, exercise, meal planning, SBG, and body weight in PRN month(s).

## 2014-12-30 ENCOUNTER — Encounter: Payer: Self-pay | Admitting: Internal Medicine

## 2015-01-12 DIAGNOSIS — R300 Dysuria: Secondary | ICD-10-CM | POA: Diagnosis not present

## 2015-01-12 DIAGNOSIS — N3001 Acute cystitis with hematuria: Secondary | ICD-10-CM | POA: Diagnosis not present

## 2015-01-20 ENCOUNTER — Ambulatory Visit (INDEPENDENT_AMBULATORY_CARE_PROVIDER_SITE_OTHER): Payer: Medicare PPO | Admitting: Orthopedic Surgery

## 2015-01-20 ENCOUNTER — Encounter: Payer: Self-pay | Admitting: Orthopedic Surgery

## 2015-01-20 VITALS — BP 110/68 | Ht 67.0 in | Wt 229.0 lb

## 2015-01-20 DIAGNOSIS — M654 Radial styloid tenosynovitis [de Quervain]: Secondary | ICD-10-CM

## 2015-01-20 DIAGNOSIS — N3001 Acute cystitis with hematuria: Secondary | ICD-10-CM | POA: Diagnosis not present

## 2015-01-20 NOTE — Progress Notes (Signed)
Patient ID: Amanda Hopkins, female   DOB: 11-21-1945, 69 y.o.   MRN: [id number]  Follow up visit  Chief Complaint  Patient presents with  . Follow-up    follow up right wrist pain, last treated 10/08/13    BP 110/68 mmHg  Ht 5\' 7"  (1.702 m)  Wt 229 lb (103.874 kg)  BMI 35.86 kg/m2  Encounter Diagnosis  Name Primary?  Tennis Must Quervain's syndrome (tenosynovitis) Yes    The patient presents with recurrent pain over the first extensor compartment of the right wrist. She complains of pain with ulnar deviation picking up small objects. No trauma  Review of systems negative for erythema and warmth over the area  Exam shows tenderness over the first extensor compartment with painful ulnar deviation but range of motion is normal. There are no motor deficits the wrist joint is stable. The neurovascular exam is intact  Impression de Quervain's tenosynovitis recurrent  Recommend injection  Ice  Follow-up as needed  Inject first extensor compartment right wrist Verbal consent Timeout The medications used were Depo-Medrol 40 mg and lidocaine 1% we used 1-1 mixture.  Skin was prepped with ethyl chloride and alcohol. Injection went well  patient  Patient will follow-up as needed

## 2015-02-03 DIAGNOSIS — R319 Hematuria, unspecified: Secondary | ICD-10-CM | POA: Diagnosis not present

## 2015-02-06 DIAGNOSIS — Z1231 Encounter for screening mammogram for malignant neoplasm of breast: Secondary | ICD-10-CM | POA: Diagnosis not present

## 2015-02-10 DIAGNOSIS — R319 Hematuria, unspecified: Secondary | ICD-10-CM | POA: Diagnosis not present

## 2015-02-10 DIAGNOSIS — M545 Low back pain: Secondary | ICD-10-CM | POA: Diagnosis not present

## 2015-02-10 DIAGNOSIS — Z90710 Acquired absence of both cervix and uterus: Secondary | ICD-10-CM | POA: Diagnosis not present

## 2015-03-04 DIAGNOSIS — E782 Mixed hyperlipidemia: Secondary | ICD-10-CM | POA: Diagnosis not present

## 2015-03-04 DIAGNOSIS — I10 Essential (primary) hypertension: Secondary | ICD-10-CM | POA: Diagnosis not present

## 2015-03-04 DIAGNOSIS — R310 Gross hematuria: Secondary | ICD-10-CM | POA: Diagnosis not present

## 2015-03-04 DIAGNOSIS — E039 Hypothyroidism, unspecified: Secondary | ICD-10-CM | POA: Diagnosis not present

## 2015-03-04 DIAGNOSIS — E119 Type 2 diabetes mellitus without complications: Secondary | ICD-10-CM | POA: Diagnosis not present

## 2015-03-11 DIAGNOSIS — L089 Local infection of the skin and subcutaneous tissue, unspecified: Secondary | ICD-10-CM | POA: Diagnosis not present

## 2015-03-11 DIAGNOSIS — Z23 Encounter for immunization: Secondary | ICD-10-CM | POA: Diagnosis not present

## 2015-03-11 DIAGNOSIS — M79675 Pain in left toe(s): Secondary | ICD-10-CM | POA: Diagnosis not present

## 2015-03-11 DIAGNOSIS — E119 Type 2 diabetes mellitus without complications: Secondary | ICD-10-CM | POA: Diagnosis not present

## 2015-03-11 DIAGNOSIS — E782 Mixed hyperlipidemia: Secondary | ICD-10-CM | POA: Diagnosis not present

## 2015-03-17 ENCOUNTER — Ambulatory Visit (INDEPENDENT_AMBULATORY_CARE_PROVIDER_SITE_OTHER): Payer: Medicare PPO | Admitting: Gastroenterology

## 2015-03-17 ENCOUNTER — Encounter: Payer: Self-pay | Admitting: Gastroenterology

## 2015-03-17 ENCOUNTER — Other Ambulatory Visit: Payer: Self-pay

## 2015-03-17 VITALS — BP 133/76 | HR 98 | Temp 97.5°F | Ht 67.0 in | Wt 235.8 lb

## 2015-03-17 DIAGNOSIS — Z800 Family history of malignant neoplasm of digestive organs: Secondary | ICD-10-CM | POA: Insufficient documentation

## 2015-03-17 DIAGNOSIS — Z86010 Personal history of colonic polyps: Secondary | ICD-10-CM

## 2015-03-17 DIAGNOSIS — D010 Carcinoma in situ of colon: Secondary | ICD-10-CM | POA: Diagnosis not present

## 2015-03-17 MED ORDER — PEG 3350-KCL-NA BICARB-NACL 420 G PO SOLR: 4000.0000 mL | Freq: Once | ORAL | Status: DC

## 2015-03-17 NOTE — Patient Instructions (Signed)
1. Colonoscopy as scheduled. See separate instructions.  2. Your daughters should have their first colonoscopy at age 71 and every 5 years thereafter.

## 2015-03-17 NOTE — Assessment & Plan Note (Signed)
69 year old female with history of carcinoma in situ of the colon in 2003. Family history of colon cancer in 3 paternal cousins. Father with history of colon polyps. Due for surveillance colonoscopy at this time.  I have discussed the risks, alternatives, benefits with regards to but not limited to the risk of reaction to medication, bleeding, infection, perforation and the patient is agreeable to proceed. Written consent to be obtained.  She has been advised that her children should have had her first colonoscopy at age 17 and every 5 years thereafter. Patient's siblings have already been encouraged to have their colonoscopies.

## 2015-03-17 NOTE — Progress Notes (Signed)
Primary Care Physician:  Alonza Bogus, MD  Primary Gastroenterologist:  Garfield Cornea, MD   Chief Complaint  Patient presents with  . Colonoscopy    HPI:  Amanda Hopkins is a 69 y.o. female here to schedule surveillance colonoscopy. 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 Last colonoscopy in August 2011. Left-sided diverticulum, long tortuous colon. Advised to come back in 5 years for surveillance colonoscopy. Patient has a history of colon cancer in 3 paternal cousins. Her father had colon polyps. Her children have not had colonoscopies.  She is doing well. Denies any GI complaints. Denies constipation, diarrhea, melena, rectal bleeding, abdominal pain, heartburn, dysphagia, vomiting, unintentional weight loss.  Current Outpatient Prescriptions  Medication Sig Dispense Refill  . Insulin Detemir (LEVEMIR FLEXPEN) 100 UNIT/ML SOPN Inject 25 Units into the skin at bedtime.     . Liraglutide (VICTOZA) 18 MG/3ML SOPN Inject into the skin.    Marland Kitchen loratadine (CLARITIN) 10 MG tablet Take 10 mg by mouth daily as needed for allergies.    Marland Kitchen losartan (COZAAR) 25 MG tablet Take 25 mg by mouth at bedtime.     . metFORMIN (GLUCOPHAGE) 500 MG tablet Take by mouth 2 (two) times daily with a meal.    . rosuvastatin (CRESTOR) 10 MG tablet Take 10 mg by mouth at bedtime.      No current facility-administered medications for this visit.    Allergies as of 03/17/2015 - Review Complete 03/17/2015  Allergen Reaction Noted  . Erythromycin  04/29/2013    Past Medical History  Diagnosis Date  . Diabetes mellitus without complication ([place])   . Hyperlipidemia   . Reflux esophagitis   . Hypertension   . Palpitations   . Obesity   . PONV (postoperative nausea and vomiting)   . Arthritis     Past Surgical History  Procedure Laterality Date  . Cesarean section      x2  . Abdominal hysterectomy    . Appendectomy    . Chest wall biopsy Right     removal of  fatty tumor  . Cataract extraction Left   . Cataract extraction w/phaco Right 11/18/2013    Procedure: CATARACT EXTRACTION PHACO AND INTRAOCULAR LENS PLACEMENT (I[facility]);  Surgeon: Tonny Branch, MD;  Location: [facility];  Service: Ophthalmology;  Laterality: Right;  CDE:  7.76  . Colonoscopy  11/08/2004    [id number] rectosigmoid polyp/left side diverticula  . Colonoscopy  12/2009    RMR: left-sided diverticula, long tortuous colon  . Colonoscopy  2003    RMR: carcinoma in situ    Family History  Problem Relation Age of Onset  . Hypertension Mother   . Colon cancer Cousin     paternal   . Colon cancer Cousin     paternal  . Colon cancer Cousin     paternal  . Colon polyps Father   . Prostate cancer Father   . Breast cancer Maternal Aunt     Social History   Social History  . Marital Status: Married    Spouse Name: N/A  . Number of Children: 2  . Years of Education: N/A   Occupational History  . Not on file.   Social History Main Topics  . Smoking status: Never Smoker   . Smokeless tobacco: Not on file  . Alcohol Use: No  . Drug Use: No  . Sexual Activity: Yes    Birth Control/ Protection: Surgical   Other Topics Concern  .  Not on file   Social History Narrative      ROS:  General: Negative for anorexia, weight loss, fever, chills, fatigue, weakness. Eyes: Negative for vision changes.  ENT: Negative for hoarseness, difficulty swallowing , nasal congestion. CV: Negative for chest pain, angina, palpitations, dyspnea on exertion, peripheral edema.  Respiratory: Negative for dyspnea at rest, dyspnea on exertion, cough, sputum, wheezing.  GI: See history of present illness. GU:  Negative for dysuria, hematuria, urinary incontinence, urinary frequency, nocturnal urination.  MS: Negative for joint pain, low back pain.  Derm: Negative for rash or itching.  Neuro: Negative for weakness, abnormal sensation, seizure, frequent headaches, memory loss, confusion.  Psych:  Negative for anxiety, depression, suicidal ideation, hallucinations.  Endo: Negative for unusual weight change.  Heme: Negative for bruising or bleeding. Allergy: Negative for rash or hives.    Physical Examination:  BP 133/76 mmHg  Pulse 98  Temp(Src) 97.5 F (36.4 C) (Oral)  Ht 5\' 7"  (1.702 m)  Wt 235 lb 12.8 oz (106.958 kg)  BMI 36.92 kg/m2   General: Well-nourished, well-developed in no acute distress.  Head: Normocephalic, atraumatic.   Eyes: Conjunctiva pink, no icterus. Mouth: Oropharyngeal mucosa moist and pink , no lesions erythema or exudate. Neck: Supple without thyromegaly, masses, or lymphadenopathy.  Lungs: Clear to auscultation bilaterally.  Heart: Regular rate and rhythm, no murmurs rubs or gallops.  Abdomen: Bowel sounds are normal, nontender, nondistended, no hepatosplenomegaly or masses, no abdominal bruits or    hernia , no rebound or guarding.   Rectal: not performed Extremities: No lower extremity edema. No clubbing or deformities.  Neuro: Alert and oriented x 4 , grossly normal neurologically.  Skin: Warm and dry, no rash or jaundice.   Psych: Alert and cooperative, normal mood and affect.

## 2015-03-18 DIAGNOSIS — E119 Type 2 diabetes mellitus without complications: Secondary | ICD-10-CM | POA: Diagnosis not present

## 2015-03-18 DIAGNOSIS — J0100 Acute maxillary sinusitis, unspecified: Secondary | ICD-10-CM | POA: Diagnosis not present

## 2015-03-18 NOTE — Progress Notes (Signed)
cc'ed to pcp °

## 2015-03-26 DIAGNOSIS — Z0000 Encounter for general adult medical examination without abnormal findings: Secondary | ICD-10-CM | POA: Diagnosis not present

## 2015-04-06 DIAGNOSIS — R310 Gross hematuria: Secondary | ICD-10-CM | POA: Diagnosis not present

## 2015-04-06 DIAGNOSIS — L739 Follicular disorder, unspecified: Secondary | ICD-10-CM | POA: Diagnosis not present

## 2015-04-06 DIAGNOSIS — R3915 Urgency of urination: Secondary | ICD-10-CM | POA: Diagnosis not present

## 2015-04-07 ENCOUNTER — Encounter (HOSPITAL_COMMUNITY): Payer: Self-pay | Admitting: *Deleted

## 2015-04-07 ENCOUNTER — Ambulatory Visit (HOSPITAL_COMMUNITY)
Admission: RE | Admit: 2015-04-07 | Discharge: 2015-04-07 | Disposition: A | Discharge status: Home / Self Care | Payer: Medicare PPO | Source: Ambulatory Visit | Attending: Internal Medicine | Admitting: Internal Medicine

## 2015-04-07 ENCOUNTER — Encounter (HOSPITAL_COMMUNITY)
Admission: RE | Disposition: A | Discharge status: Home / Self Care | Payer: Self-pay | Source: Ambulatory Visit | Attending: Internal Medicine

## 2015-04-07 DIAGNOSIS — Z794 Long term (current) use of insulin: Secondary | ICD-10-CM | POA: Insufficient documentation

## 2015-04-07 DIAGNOSIS — Q438 Other specified congenital malformations of intestine: Secondary | ICD-10-CM | POA: Diagnosis not present

## 2015-04-07 DIAGNOSIS — K635 Polyp of colon: Secondary | ICD-10-CM | POA: Diagnosis not present

## 2015-04-07 DIAGNOSIS — R310 Gross hematuria: Secondary | ICD-10-CM | POA: Diagnosis not present

## 2015-04-07 DIAGNOSIS — I10 Essential (primary) hypertension: Secondary | ICD-10-CM | POA: Insufficient documentation

## 2015-04-07 DIAGNOSIS — Z7984 Long term (current) use of oral hypoglycemic drugs: Secondary | ICD-10-CM | POA: Diagnosis not present

## 2015-04-07 DIAGNOSIS — D120 Benign neoplasm of cecum: Secondary | ICD-10-CM | POA: Insufficient documentation

## 2015-04-07 DIAGNOSIS — Z860100 Personal history of colon polyps, unspecified: Secondary | ICD-10-CM | POA: Insufficient documentation

## 2015-04-07 DIAGNOSIS — Z79899 Other long term (current) drug therapy: Secondary | ICD-10-CM | POA: Diagnosis not present

## 2015-04-07 DIAGNOSIS — R3915 Urgency of urination: Secondary | ICD-10-CM | POA: Diagnosis not present

## 2015-04-07 DIAGNOSIS — E669 Obesity, unspecified: Secondary | ICD-10-CM | POA: Diagnosis not present

## 2015-04-07 DIAGNOSIS — E119 Type 2 diabetes mellitus without complications: Secondary | ICD-10-CM | POA: Diagnosis not present

## 2015-04-07 DIAGNOSIS — Z800 Family history of malignant neoplasm of digestive organs: Secondary | ICD-10-CM | POA: Insufficient documentation

## 2015-04-07 DIAGNOSIS — Z6836 Body mass index (BMI) 36.0-36.9, adult: Secondary | ICD-10-CM | POA: Insufficient documentation

## 2015-04-07 DIAGNOSIS — K5730 Diverticulosis of large intestine without perforation or abscess without bleeding: Secondary | ICD-10-CM | POA: Insufficient documentation

## 2015-04-07 DIAGNOSIS — E785 Hyperlipidemia, unspecified: Secondary | ICD-10-CM | POA: Diagnosis not present

## 2015-04-07 DIAGNOSIS — D124 Benign neoplasm of descending colon: Secondary | ICD-10-CM | POA: Diagnosis not present

## 2015-04-07 HISTORY — PX: COLONOSCOPY: SHX5424

## 2015-04-07 LAB — GLUCOSE, CAPILLARY: GLUCOSE-CAPILLARY: 162 mg/dL — AB (ref 65–99)

## 2015-04-07 SURGERY — COLONOSCOPY
Anesthesia: Moderate Sedation

## 2015-04-07 MED ORDER — MEPERIDINE HCL 100 MG/ML IJ SOLN
INTRAMUSCULAR | Status: AC
  Filled 2015-04-07: qty 1

## 2015-04-07 MED ORDER — STERILE WATER FOR IRRIGATION IR SOLN
Status: DC | PRN
  Administered 2015-04-07: 12:00:00

## 2015-04-07 MED ORDER — MIDAZOLAM HCL 5 MG/5ML IJ SOLN
INTRAMUSCULAR | Status: AC
  Filled 2015-04-07: qty 10

## 2015-04-07 MED ORDER — SODIUM CHLORIDE 0.9 % IJ SOLN
INTRAMUSCULAR | Status: AC
  Filled 2015-04-07: qty 3

## 2015-04-07 MED ORDER — ONDANSETRON HCL 4 MG/2ML IJ SOLN
INTRAMUSCULAR | Status: AC
  Filled 2015-04-07: qty 2

## 2015-04-07 MED ORDER — SODIUM CHLORIDE 0.9 % IV SOLN
INTRAVENOUS | Status: DC
  Administered 2015-04-07: 11:00:00 via INTRAVENOUS

## 2015-04-07 MED ORDER — ONDANSETRON HCL 4 MG/2ML IJ SOLN
INTRAMUSCULAR | Status: DC | PRN
  Administered 2015-04-07: 4 mg via INTRAVENOUS

## 2015-04-07 MED ORDER — PROMETHAZINE HCL 25 MG/ML IJ SOLN
12.5000 mg | Freq: Once | INTRAMUSCULAR | Status: AC
  Administered 2015-04-07: 12.5 mg via INTRAVENOUS

## 2015-04-07 MED ORDER — PROMETHAZINE HCL 25 MG/ML IJ SOLN
INTRAMUSCULAR | Status: AC
  Filled 2015-04-07: qty 1

## 2015-04-07 MED ORDER — MEPERIDINE HCL 100 MG/ML IJ SOLN
INTRAMUSCULAR | Status: DC | PRN
  Administered 2015-04-07: 25 mg
  Administered 2015-04-07: 50 mg

## 2015-04-07 MED ORDER — MIDAZOLAM HCL 5 MG/5ML IJ SOLN
INTRAMUSCULAR | Status: DC | PRN
  Administered 2015-04-07 (×3): 1 mg via INTRAVENOUS
  Administered 2015-04-07: 2 mg via INTRAVENOUS

## 2015-04-07 MED ORDER — MEPERIDINE HCL 100 MG/ML IJ SOLN
INTRAMUSCULAR | Status: AC
  Filled 2015-04-07: qty 2

## 2015-04-07 NOTE — Discharge Instructions (Signed)
°Colonoscopy °Discharge Instructions ° °Read the instructions outlined below and refer to this sheet in the next few weeks. These discharge instructions provide you with general information on caring for yourself after you leave the hospital. Your doctor may also give you specific instructions. While your treatment has been planned according to the most current medical practices available, unavoidable complications occasionally occur. If you have any problems or questions after discharge, call Dr. Rourk at [phone]. °ACTIVITY °· You may resume your regular activity, but move at a slower pace for the next 24 hours.  °· Take frequent rest periods for the next 24 hours.  °· Walking will help get rid of the air and reduce the bloated feeling in your belly (abdomen).  °· No driving for 24 hours (because of the medicine (anesthesia) used during the test).   °· Do not sign any important legal documents or operate any machinery for 24 hours (because of the anesthesia used during the test).  °NUTRITION °· Drink plenty of fluids.  °· You may resume your normal diet as instructed by your doctor.  °· Begin with a light meal and progress to your normal diet. Heavy or fried foods are harder to digest and may make you feel sick to your stomach (nauseated).  °· Avoid alcoholic beverages for 24 hours or as instructed.  °MEDICATIONS °· You may resume your normal medications unless your doctor tells you otherwise.  °WHAT YOU CAN EXPECT TODAY °· Some feelings of bloating in the abdomen.  °· Passage of more gas than usual.  °· Spotting of blood in your stool or on the toilet paper.  °IF YOU HAD POLYPS REMOVED DURING THE COLONOSCOPY: °· No aspirin products for 7 days or as instructed.  °· No alcohol for 7 days or as instructed.  °· Eat a soft diet for the next 24 hours.  °FINDING OUT THE RESULTS OF YOUR TEST °Not all test results are available during your visit. If your test results are not back during the visit, make an appointment  with your caregiver to find out the results. Do not assume everything is normal if you have not heard from your caregiver or the medical facility. It is important for you to follow up on all of your test results.  °SEEK IMMEDIATE MEDICAL ATTENTION IF: °· You have more than a spotting of blood in your stool.  °· Your belly is swollen (abdominal distention).  °· You are nauseated or vomiting.  °· You have a temperature over 101.  °· You have abdominal pain or discomfort that is severe or gets worse throughout the day.  ° ° °Diverticulosis and colon polyp information provided ° °Further recommendations to follow pending review of pathology report ° ° °Diverticulosis °Diverticulosis is the condition that develops when small pouches (diverticula) form in the wall of your colon. Your colon, or large intestine, is where water is absorbed and stool is formed. The pouches form when the inside layer of your colon pushes through weak spots in the outer layers of your colon. °CAUSES  °No one knows exactly what causes diverticulosis. °RISK FACTORS °· Being older than 50. Your risk for this condition increases with age. Diverticulosis is rare in people younger than 40 years. By age 80, almost everyone has it. °· Eating a low-fiber diet. °· Being frequently constipated. °· Being overweight. °· Not getting enough exercise. °· Smoking. °· Taking over-the-counter pain medicines, like aspirin and ibuprofen. °SYMPTOMS  °Most people with diverticulosis do not have symptoms. °DIAGNOSIS  °  Because diverticulosis often has no symptoms, health care providers often discover the condition during an exam for other colon problems. In many cases, a health care provider will diagnose diverticulosis while using a flexible scope to examine the colon (colonoscopy). °TREATMENT  °If you have never developed an infection related to diverticulosis, you may not need treatment. If you have had an infection before, treatment may include: °· Eating more  fruits, vegetables, and grains. °· Taking a fiber supplement. °· Taking a live bacteria supplement (probiotic). °· Taking medicine to relax your colon. °HOME CARE INSTRUCTIONS  °· Drink at least 6-8 glasses of water each day to prevent constipation. °· Try not to strain when you have a bowel movement. °· Keep all follow-up appointments. °If you have had an infection before:  °· Increase the fiber in your diet as directed by your health care provider or dietitian. °· Take a dietary fiber supplement if your health care provider approves. °· Only take medicines as directed by your health care provider. °SEEK MEDICAL CARE IF:  °· You have abdominal pain. °· You have bloating. °· You have cramps. °· You have not gone to the bathroom in 3 days. °SEEK IMMEDIATE MEDICAL CARE IF:  °· Your pain gets worse. °· Your bloating becomes very bad. °· You have a fever or chills, and your symptoms suddenly get worse. °· You begin vomiting. °· You have bowel movements that are bloody or black. °MAKE SURE YOU: °· Understand these instructions. °· Will watch your condition. °· Will get help right away if you are not doing well or get worse. °  °This information is not intended to replace advice given to you by your health care provider. Make sure you discuss any questions you have with your health care provider. °  °Document Released: 02/11/2004 Document Revised: 05/21/2013 Document Reviewed: 04/10/2013 °Elsevier Interactive Patient Education ©2016 [employer] °Colon Polyps °Polyps are lumps of extra tissue growing inside the body. Polyps can grow in the large intestine (colon). Most colon polyps are noncancerous (benign). However, some colon polyps can become cancerous over time. Polyps that are larger than a pea may be harmful. To be safe, caregivers remove and test all polyps. °CAUSES  °Polyps form when mutations in the genes cause your cells to grow and divide even though no more tissue is needed. °RISK FACTORS °There are a number  of risk factors that can increase your chances of getting colon polyps. They include: °· Being older than 50 years. °· Family history of colon polyps or colon cancer. °· Long-term colon diseases, such as colitis or Crohn disease. °· Being overweight. °· Smoking. °· Being inactive. °· Drinking too much alcohol. °SYMPTOMS  °Most small polyps do not cause symptoms. If symptoms are present, they may include: °· Blood in the stool. The stool may look dark red or black. °· Constipation or diarrhea that lasts longer than 1 week. °DIAGNOSIS °People often do not know they have polyps until their caregiver finds them during a regular checkup. Your caregiver can use 4 tests to check for polyps: °· Digital rectal exam. The caregiver wears gloves and feels inside the rectum. This test would find polyps only in the rectum. °· Barium enema. The caregiver puts a liquid called barium into your rectum before taking X-rays of your colon. Barium makes your colon look white. Polyps are dark, so they are easy to see in the X-ray pictures. °· Sigmoidoscopy. A thin, flexible tube (sigmoidoscope) is placed into your rectum. The sigmoidoscope has a   light and tiny camera in it. The caregiver uses the sigmoidoscope to look at the last third of your colon. °· Colonoscopy. This test is like sigmoidoscopy, but the caregiver looks at the entire colon. This is the most common method for finding and removing polyps. °TREATMENT  °Any polyps will be removed during a sigmoidoscopy or colonoscopy. The polyps are then tested for cancer. °PREVENTION  °To help lower your risk of getting more colon polyps: °· Eat plenty of fruits and vegetables. Avoid eating fatty foods. °· Do not smoke. °· Avoid drinking alcohol. °· Exercise every day. °· Lose weight if recommended by your caregiver. °· Eat plenty of calcium and folate. Foods that are rich in calcium include milk, cheese, and broccoli. Foods that are rich in folate include chickpeas, kidney beans, and  spinach. °HOME CARE INSTRUCTIONS °Keep all follow-up appointments as directed by your caregiver. You may need periodic exams to check for polyps. °SEEK MEDICAL CARE IF: °You notice bleeding during a bowel movement. °  °This information is not intended to replace advice given to you by your health care provider. Make sure you discuss any questions you have with your health care provider. °  °Document Released: 02/10/2004 Document Revised: 06/06/2014 Document Reviewed: 07/26/2011 °Elsevier Interactive Patient Education ©2016 Elsevier Inc. ° °

## 2015-04-07 NOTE — H&P (View-Only) (Signed)
Primary Care Physician:  Alonza Bogus, MD  Primary Gastroenterologist:  Garfield Cornea, MD   Chief Complaint  Patient presents with  . Colonoscopy    HPI:  Amanda Hopkins is a 69 y.o. female here to schedule surveillance colonoscopy. 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 Last colonoscopy in August 2011. Left-sided diverticulum, long tortuous colon. Advised to come back in 5 years for surveillance colonoscopy. Patient has a history of colon cancer in 3 paternal cousins. Her father had colon polyps. Her children have not had colonoscopies.  She is doing well. Denies any GI complaints. Denies constipation, diarrhea, melena, rectal bleeding, abdominal pain, heartburn, dysphagia, vomiting, unintentional weight loss.  Current Outpatient Prescriptions  Medication Sig Dispense Refill  . Insulin Detemir (LEVEMIR FLEXPEN) 100 UNIT/ML SOPN Inject 25 Units into the skin at bedtime.     . Liraglutide (VICTOZA) 18 MG/3ML SOPN Inject into the skin.    Marland Kitchen loratadine (CLARITIN) 10 MG tablet Take 10 mg by mouth daily as needed for allergies.    Marland Kitchen losartan (COZAAR) 25 MG tablet Take 25 mg by mouth at bedtime.     . metFORMIN (GLUCOPHAGE) 500 MG tablet Take by mouth 2 (two) times daily with a meal.    . rosuvastatin (CRESTOR) 10 MG tablet Take 10 mg by mouth at bedtime.      No current facility-administered medications for this visit.    Allergies as of 03/17/2015 - Review Complete 03/17/2015  Allergen Reaction Noted  . Erythromycin  04/29/2013    Past Medical History  Diagnosis Date  . Diabetes mellitus without complication ([place])   . Hyperlipidemia   . Reflux esophagitis   . Hypertension   . Palpitations   . Obesity   . PONV (postoperative nausea and vomiting)   . Arthritis     Past Surgical History  Procedure Laterality Date  . Cesarean section      x2  . Abdominal hysterectomy    . Appendectomy    . Chest wall biopsy Right     removal of  fatty tumor  . Cataract extraction Left   . Cataract extraction w/phaco Right 11/18/2013    Procedure: CATARACT EXTRACTION PHACO AND INTRAOCULAR LENS PLACEMENT (IOC);  Surgeon: Tonny Branch, MD;  Location: [facility];  Service: Ophthalmology;  Laterality: Right;  CDE:  7.76  . Colonoscopy  11/08/2004    VVO:[id number] rectosigmoid polyp/left side diverticula  . Colonoscopy  12/2009    RMR: left-sided diverticula, long tortuous colon  . Colonoscopy  2003    RMR: carcinoma in situ    Family History  Problem Relation Age of Onset  . Hypertension Mother   . Colon cancer Cousin     paternal   . Colon cancer Cousin     paternal  . Colon cancer Cousin     paternal  . Colon polyps Father   . Prostate cancer Father   . Breast cancer Maternal Aunt     Social History   Social History  . Marital Status: Married    Spouse Name: N/A  . Number of Children: 2  . Years of Education: N/A   Occupational History  . Not on file.   Social History Main Topics  . Smoking status: Never Smoker   . Smokeless tobacco: Not on file  . Alcohol Use: No  . Drug Use: No  . Sexual Activity: Yes    Birth Control/ Protection: Surgical   Other Topics Concern  .  Not on file   Social History Narrative      ROS:  General: Negative for anorexia, weight loss, fever, chills, fatigue, weakness. Eyes: Negative for vision changes.  ENT: Negative for hoarseness, difficulty swallowing , nasal congestion. CV: Negative for chest pain, angina, palpitations, dyspnea on exertion, peripheral edema.  Respiratory: Negative for dyspnea at rest, dyspnea on exertion, cough, sputum, wheezing.  GI: See history of present illness. GU:  Negative for dysuria, hematuria, urinary incontinence, urinary frequency, nocturnal urination.  MS: Negative for joint pain, low back pain.  Derm: Negative for rash or itching.  Neuro: Negative for weakness, abnormal sensation, seizure, frequent headaches, memory loss, confusion.  Psych:  Negative for anxiety, depression, suicidal ideation, hallucinations.  Endo: Negative for unusual weight change.  Heme: Negative for bruising or bleeding. Allergy: Negative for rash or hives.    Physical Examination:  BP 133/76 mmHg  Pulse 98  Temp(Src) 97.5 F (36.4 C) (Oral)  Ht 5\' 7"  (1.702 m)  Wt 235 lb 12.8 oz (106.958 kg)  BMI 36.92 kg/m2   General: Well-nourished, well-developed in no acute distress.  Head: Normocephalic, atraumatic.   Eyes: Conjunctiva pink, no icterus. Mouth: Oropharyngeal mucosa moist and pink , no lesions erythema or exudate. Neck: Supple without thyromegaly, masses, or lymphadenopathy.  Lungs: Clear to auscultation bilaterally.  Heart: Regular rate and rhythm, no murmurs rubs or gallops.  Abdomen: Bowel sounds are normal, nontender, nondistended, no hepatosplenomegaly or masses, no abdominal bruits or    hernia , no rebound or guarding.   Rectal: not performed Extremities: No lower extremity edema. No clubbing or deformities.  Neuro: Alert and oriented x 4 , grossly normal neurologically.  Skin: Warm and dry, no rash or jaundice.   Psych: Alert and cooperative, normal mood and affect.

## 2015-04-07 NOTE — Op Note (Signed)
[facility] [address] [place], [address]   COLONOSCOPY PROCEDURE REPORT  PATIENT: Amanda Hopkins, Amanda Hopkins  MR#: [id number] BIRTHDATE: Sep 03, 1945 , 68  yrs. old GENDER: female ENDOSCOPIST: R.  Garfield Cornea, MD FACP [facility] REFERRED DI:YMEBRA Luan Pulling, M.D.  Cherrie Distance, M.D. PROCEDURE DATE:  04/26/2015 PROCEDURE:   Colonoscopy with snare polypectomy INDICATIONS:Surveillance examination; history of colonic adenoma. MEDICATIONS: Versed 5 mg IV and Demerol 75 mg IV in divided doses. Zofran 4 mg IV. ASA CLASS:       Class II  CONSENT: The risks, benefits, alternatives and imponderables including but not limited to bleeding, perforation as well as the possibility of a missed lesion have been reviewed.  The potential for biopsy, lesion removal, etc. have also been discussed. Questions have been answered.  All parties agreeable.  Please see the history and physical in the medical record for more information.  DESCRIPTION OF PROCEDURE:   After the risks benefits and alternatives of the procedure were thoroughly explained, informed consent was obtained.  The digital rectal exam revealed no abnormalities of the rectum.   The [id number] ([id number])  endoscope was introduced through the anus and advanced to the cecum, which was identified by both the appendix and ileocecal valve. No adverse events experienced.   The quality of the prep was adequate  The instrument was then slowly withdrawn as the colon was fully examined. Estimated blood loss is zero unless otherwise noted in this procedure report.      COLON FINDINGS: Normal-appearing rectal mucosa.  Redundant colon. Scattered left-sided diverticula; (1) 6 mm polyp in the mid descending segment and (1) 4 mm polyp in the cecum; otherwise, the remainder clock because appear normal.  The above-mentioned polyps were cold snare removed and recovered. Retroflexion was performed. .  Withdrawal time=7 minutes 0 seconds.  The scope  was withdrawn and the procedure completed. COMPLICATIONS: There were no immediate complications. EBL 3 mL ENDOSCOPIC IMPRESSION: Colonic diverticulosis. Multiple colonic polyps?"removed as described above. Redundant colon.  RECOMMENDATIONS: Follow-up on pathology.  eSigned:  R. Garfield Cornea, MD Rosalita Chessman [facility] 2015-04-26 12:23 PM   cc:  CPT CODES: ICD CODES:  The ICD and CPT codes recommended by this software are interpretations from the data that the clinical staff has captured with the software.  The verification of the translation of this report to the ICD and CPT codes and modifiers is the sole responsibility of the health care institution and practicing physician where this report was generated.  [employer] will not be held responsible for the validity of the ICD and CPT codes included on this report.  AMA assumes no liability for data contained or not contained herein. CPT is a Designer, television/film set of the [employer].

## 2015-04-07 NOTE — Interval H&P Note (Signed)
History and Physical Interval Note:  04/07/2015 11:52 AM  Amanda Hopkins  has presented today for surgery, with the diagnosis of history of colon polyps  The various methods of treatment have been discussed with the patient and family. After consideration of risks, benefits and other options for treatment, the patient has consented to  Procedure(s) with comments: COLONOSCOPY (N/A) - 1100 - moved to 11/8 @ 11:30 - office to notify as a surgical intervention .  The patient's history has been reviewed, patient examined, no change in status, stable for surgery.  I have reviewed the patient's chart and labs.  Questions were answered to the patient's satisfaction.     Amanda Hopkins  No change. Surveillance colonoscopy per plan.  The risks, benefits, limitations, alternatives and imponderables have been reviewed with the patient. Questions have been answered. All parties are agreeable.

## 2015-04-08 ENCOUNTER — Encounter: Payer: Self-pay | Admitting: Internal Medicine

## 2015-04-09 DIAGNOSIS — R310 Gross hematuria: Secondary | ICD-10-CM | POA: Diagnosis not present

## 2015-04-13 ENCOUNTER — Encounter (HOSPITAL_COMMUNITY): Payer: Self-pay | Admitting: Internal Medicine

## 2015-04-17 DIAGNOSIS — K760 Fatty (change of) liver, not elsewhere classified: Secondary | ICD-10-CM | POA: Diagnosis not present

## 2015-04-17 DIAGNOSIS — Q632 Ectopic kidney: Secondary | ICD-10-CM | POA: Diagnosis not present

## 2015-04-17 DIAGNOSIS — N200 Calculus of kidney: Secondary | ICD-10-CM | POA: Diagnosis not present

## 2015-04-17 DIAGNOSIS — K5730 Diverticulosis of large intestine without perforation or abscess without bleeding: Secondary | ICD-10-CM | POA: Diagnosis not present

## 2015-04-17 DIAGNOSIS — I7090 Unspecified atherosclerosis: Secondary | ICD-10-CM | POA: Diagnosis not present

## 2015-04-17 DIAGNOSIS — R310 Gross hematuria: Secondary | ICD-10-CM | POA: Diagnosis not present

## 2015-04-17 DIAGNOSIS — K439 Ventral hernia without obstruction or gangrene: Secondary | ICD-10-CM | POA: Diagnosis not present

## 2015-04-28 DIAGNOSIS — R310 Gross hematuria: Secondary | ICD-10-CM | POA: Diagnosis not present

## 2015-04-28 DIAGNOSIS — R3915 Urgency of urination: Secondary | ICD-10-CM | POA: Diagnosis not present

## 2015-04-28 DIAGNOSIS — L739 Follicular disorder, unspecified: Secondary | ICD-10-CM | POA: Diagnosis not present

## 2015-04-28 DIAGNOSIS — N1330 Unspecified hydronephrosis: Secondary | ICD-10-CM | POA: Diagnosis not present

## 2015-05-01 DIAGNOSIS — Q632 Ectopic kidney: Secondary | ICD-10-CM | POA: Diagnosis not present

## 2015-05-01 DIAGNOSIS — N2889 Other specified disorders of kidney and ureter: Secondary | ICD-10-CM | POA: Diagnosis not present

## 2015-05-01 DIAGNOSIS — N1330 Unspecified hydronephrosis: Secondary | ICD-10-CM | POA: Diagnosis not present

## 2015-11-24 ENCOUNTER — Other Ambulatory Visit (HOSPITAL_COMMUNITY): Payer: Self-pay | Admitting: Pulmonary Disease

## 2015-11-24 DIAGNOSIS — Z780 Asymptomatic menopausal state: Secondary | ICD-10-CM

## 2015-11-26 ENCOUNTER — Ambulatory Visit (HOSPITAL_COMMUNITY)
Admission: RE | Admit: 2015-11-26 | Discharge: 2015-11-26 | Disposition: A | Discharge status: Home / Self Care | Payer: Medicare Other | Source: Ambulatory Visit | Attending: Pulmonary Disease | Admitting: Pulmonary Disease

## 2015-11-26 DIAGNOSIS — Z780 Asymptomatic menopausal state: Secondary | ICD-10-CM | POA: Insufficient documentation

## 2016-01-04 ENCOUNTER — Other Ambulatory Visit: Payer: Self-pay | Admitting: "Endocrinology

## 2016-01-04 ENCOUNTER — Telehealth: Payer: Self-pay | Admitting: "Endocrinology

## 2016-01-04 DIAGNOSIS — E1169 Type 2 diabetes mellitus with other specified complication: Principal | ICD-10-CM

## 2016-01-04 DIAGNOSIS — IMO0002 Reserved for concepts with insufficient information to code with codable children: Secondary | ICD-10-CM

## 2016-01-04 DIAGNOSIS — E039 Hypothyroidism, unspecified: Secondary | ICD-10-CM

## 2016-01-04 DIAGNOSIS — E1165 Type 2 diabetes mellitus with hyperglycemia: Secondary | ICD-10-CM

## 2016-01-04 NOTE — Telephone Encounter (Signed)
Will you please put new labs in for Amanda Hopkins, she is going to [facility]

## 2016-02-05 ENCOUNTER — Other Ambulatory Visit: Payer: Self-pay | Admitting: "Endocrinology

## 2016-02-06 LAB — T4, FREE: FREE T4: 1.3 ng/dL (ref 0.8–1.8)

## 2016-02-06 LAB — COMPREHENSIVE METABOLIC PANEL
ALK PHOS: 54 U/L (ref 33–130)
ALT: 33 U/L — ABNORMAL HIGH (ref 6–29)
AST: 25 U/L (ref 10–35)
Albumin: 4.2 g/dL (ref 3.6–5.1)
BUN: 14 mg/dL (ref 7–25)
CALCIUM: 9.5 mg/dL (ref 8.6–10.4)
CO2: 25 mmol/L (ref 20–31)
Chloride: 108 mmol/L (ref 98–110)
Creat: 0.67 mg/dL (ref 0.60–0.93)
GLUCOSE: 115 mg/dL — AB (ref 65–99)
POTASSIUM: 3.9 mmol/L (ref 3.5–5.3)
Sodium: 143 mmol/L (ref 135–146)
Total Bilirubin: 0.4 mg/dL (ref 0.2–1.2)
Total Protein: 6 g/dL — ABNORMAL LOW (ref 6.1–8.1)

## 2016-02-06 LAB — TSH: TSH: 0.48 m[IU]/L

## 2016-02-06 LAB — HEMOGLOBIN A1C
Hgb A1c MFr Bld: 7.3 % — ABNORMAL HIGH (ref <5.7)
Mean Plasma Glucose: 163 mg/dL

## 2016-02-12 ENCOUNTER — Encounter: Payer: Self-pay | Admitting: "Endocrinology

## 2016-02-12 ENCOUNTER — Ambulatory Visit (INDEPENDENT_AMBULATORY_CARE_PROVIDER_SITE_OTHER): Payer: Medicare Other | Admitting: "Endocrinology

## 2016-02-12 VITALS — BP 123/84 | HR 92 | Wt 228.8 lb

## 2016-02-12 DIAGNOSIS — I10 Essential (primary) hypertension: Secondary | ICD-10-CM | POA: Diagnosis not present

## 2016-02-12 DIAGNOSIS — E782 Mixed hyperlipidemia: Secondary | ICD-10-CM | POA: Insufficient documentation

## 2016-02-12 DIAGNOSIS — E785 Hyperlipidemia, unspecified: Secondary | ICD-10-CM

## 2016-02-12 DIAGNOSIS — E1165 Type 2 diabetes mellitus with hyperglycemia: Secondary | ICD-10-CM | POA: Diagnosis not present

## 2016-02-12 DIAGNOSIS — E118 Type 2 diabetes mellitus with unspecified complications: Secondary | ICD-10-CM | POA: Diagnosis not present

## 2016-02-12 DIAGNOSIS — IMO0002 Reserved for concepts with insufficient information to code with codable children: Secondary | ICD-10-CM

## 2016-02-12 DIAGNOSIS — Z794 Long term (current) use of insulin: Secondary | ICD-10-CM

## 2016-02-12 MED ORDER — DULAGLUTIDE 0.75 MG/0.5ML ~~LOC~~ SOAJ: 0.7500 mg | SUBCUTANEOUS | 2 refills | Status: DC

## 2016-02-12 NOTE — Progress Notes (Signed)
Subjective:    Patient ID: Amanda Hopkins, female    DOB: Oct 10, 1945, PCP Alonza Bogus, MD   Past Medical History:  Diagnosis Date  . Arthritis   . Diabetes mellitus without complication ([place])   . Hyperlipidemia   . Hypertension   . Obesity   . Palpitations   . PONV (postoperative nausea and vomiting)   . Reflux esophagitis    Past Surgical History:  Procedure Laterality Date  . ABDOMINAL HYSTERECTOMY    . APPENDECTOMY    . CATARACT EXTRACTION Left   . CATARACT EXTRACTION W/PHACO Right 11/18/2013   Procedure: CATARACT EXTRACTION PHACO AND INTRAOCULAR LENS PLACEMENT (IOC);  Surgeon: Tonny Branch, MD;  Location: [facility];  Service: Ophthalmology;  Laterality: Right;  CDE:  7.76  . CESAREAN SECTION     x2  . CHEST WALL BIOPSY Right    removal of fatty tumor  . COLONOSCOPY  11/08/2004   [id number] rectosigmoid polyp/left side diverticula  . COLONOSCOPY  12/2009   RMR: left-sided diverticula, long tortuous colon  . COLONOSCOPY  2003   RMR: carcinoma in situ  . COLONOSCOPY N/A 04/07/2015   Procedure: COLONOSCOPY;  Surgeon: Daneil Dolin, MD;  Location: [facility];  Service: Endoscopy;  Laterality: N/A;  1100 - moved to 11/8 @ 11:30 - office to notify   Social History   Social History  . Marital status: Married    Spouse name: N/A  . Number of children: 2  . Years of education: N/A   Social History Main Topics  . Smoking status: Never Smoker  . Smokeless tobacco: None  . Alcohol use No  . Drug use: No  . Sexual activity: Yes    Birth control/ protection: Surgical   Other Topics Concern  . None   Social History Narrative  . None   Outpatient Encounter Prescriptions as of 02/12/2016  Medication Sig  . Dulaglutide (TRULICITY) A999333 0000000 SOPN Inject 0.75 mg into the skin once a week.  . Insulin Detemir (LEVEMIR FLEXPEN) 100 UNIT/ML SOPN Inject 25 Units into the skin at bedtime.   Marland Kitchen loratadine (CLARITIN) 10 MG tablet Take 10 mg by mouth daily as needed  for allergies.  Marland Kitchen losartan (COZAAR) 25 MG tablet Take 25 mg by mouth at bedtime.   . metFORMIN (GLUCOPHAGE) 500 MG tablet Take 1,500 mg by mouth daily with breakfast.   . rosuvastatin (CRESTOR) 10 MG tablet Take 10 mg by mouth at bedtime.   . [DISCONTINUED] Liraglutide (VICTOZA) 18 MG/3ML SOPN Inject into the skin.  . [DISCONTINUED] polyethylene glycol-electrolytes (NULYTELY/GOLYTELY) 420 G solution Take 4,000 mLs by mouth once.   No facility-administered encounter medications on file as of 02/12/2016.    ALLERGIES: Allergies  Allergen Reactions  . Erythromycin    VACCINATION STATUS:  There is no immunization history on file for this patient.  Diabetes  She presents for her follow-up diabetic visit. She has type 2 diabetes mellitus. Onset time: She was diagnosed at approximate age of 80 years. Her disease course has been stable. There are no hypoglycemic associated symptoms. Pertinent negatives for hypoglycemia include no confusion, headaches, pallor or seizures. There are no diabetic associated symptoms. Pertinent negatives for diabetes include no chest pain, no polydipsia, no polyphagia and no polyuria. There are no hypoglycemic complications. Symptoms are stable. There are no diabetic complications. Risk factors for coronary artery disease include diabetes mellitus, dyslipidemia, hypertension, obesity and sedentary lifestyle. She is compliant with treatment most of the time. Her weight is  stable. She is following a diabetic diet. When asked about meal planning, she reported none. She has had a previous visit with a dietitian. She rarely participates in exercise. Her overall blood glucose range is 140-180 mg/dl. An ACE inhibitor/angiotensin II receptor blocker is being taken.  Hyperlipidemia  This is a chronic problem. The current episode started more than 1 year ago. Pertinent negatives include no chest pain, myalgias or shortness of breath. Current antihyperlipidemic treatment includes  statins.  Hypertension  This is a chronic problem. The current episode started more than 1 year ago. Pertinent negatives include no chest pain, headaches, palpitations or shortness of breath. Risk factors for coronary artery disease include dyslipidemia and diabetes mellitus.     Review of Systems  Constitutional: Negative for chills, fever and unexpected weight change.  HENT: Negative for trouble swallowing and voice change.   Eyes: Negative for visual disturbance.  Respiratory: Negative for cough, shortness of breath and wheezing.        She insists that Victoza is causing her to have shortness of breath.  Cardiovascular: Negative for chest pain, palpitations and leg swelling.  Gastrointestinal: Negative for diarrhea, nausea and vomiting.  Endocrine: Negative for cold intolerance, heat intolerance, polydipsia, polyphagia and polyuria.  Musculoskeletal: Negative for arthralgias and myalgias.  Skin: Negative for color change, pallor, rash and wound.  Neurological: Negative for seizures and headaches.  Psychiatric/Behavioral: Negative for confusion and suicidal ideas.    Objective:    BP 123/84   Pulse 92   Wt 228 lb 12.8 oz (103.8 kg)   BMI 35.84 kg/m   Wt Readings from Last 3 Encounters:  02/12/16 228 lb 12.8 oz (103.8 kg)  04/07/15 235 lb (106.6 kg)  03/17/15 235 lb 12.8 oz (107 kg)    Physical Exam  Constitutional: She is oriented to person, place, and time. She appears well-developed.  HENT:  Head: Normocephalic and atraumatic.  Eyes: EOM are normal.  Neck: Normal range of motion. Neck supple. No tracheal deviation present. No thyromegaly present.  Cardiovascular: Normal rate and regular rhythm.   Pulmonary/Chest: Effort normal and breath sounds normal.  Abdominal: Soft. Bowel sounds are normal. There is no tenderness. There is no guarding.  Musculoskeletal: Normal range of motion. She exhibits no edema.  Neurological: She is alert and oriented to person, place, and  time. She has normal reflexes. No cranial nerve deficit. Coordination normal.  Skin: Skin is warm and dry. No rash noted. No erythema. No pallor.  Psychiatric: She has a normal mood and affect. Judgment normal.    CMP     Component Value Date/Time   NA 143 02/05/2016 1434   K 3.9 02/05/2016 1434   CL 108 02/05/2016 1434   CO2 25 02/05/2016 1434   GLUCOSE 115 (H) 02/05/2016 1434   BUN 14 02/05/2016 1434   CREATININE 0.67 02/05/2016 1434   CALCIUM 9.5 02/05/2016 1434   PROT 6.0 (L) 02/05/2016 1434   ALBUMIN 4.2 02/05/2016 1434   AST 25 02/05/2016 1434   ALT 33 (H) 02/05/2016 1434   ALKPHOS 54 02/05/2016 1434   BILITOT 0.4 02/05/2016 1434   GFRNONAA 88 (L) 11/12/2013 1305   GFRAA >90 11/12/2013 1305    Diabetic Labs (most recent): Lab Results  Component Value Date   HGBA1C 7.3 (H) 02/05/2016    Assessment & Plan:   1. Uncontrolled type 2 diabetes mellitus with complication, with long-term current use of insulin ([place])  - patient remains at a high risk for more acute and  chronic complications of diabetes which include CAD, CVA, CKD, retinopathy, and neuropathy. These are all discussed in detail with the patient.  Patient came with Controlled fasting glucose profile, and  recent A1c of 7.3 %.  Glucose logs and insulin administration records pertaining to this visit,  to be scanned into patient's records.  Recent labs reviewed.   - I have re-counseled the patient on diet management and weight loss  by adopting a carbohydrate restricted / protein rich  Diet.  - Suggestion is made for patient to avoid simple carbohydrates   from their diet including Cakes , Desserts, Ice Cream,  Soda (  diet and regular) , Sweet Tea , Candies,  Chips, Cookies, Artificial Sweeteners,   and "Sugar-free" Products .  This will help patient to have stable blood glucose profile and potentially avoid unintended  Weight gain.  - Patient is advised to stick to a routine mealtimes to eat 3 meals  a day and  avoid unnecessary snacks ( to snack only to correct hypoglycemia).  - The patient  has been  scheduled with Jearld Fenton, RDN, CDE for individualized DM education.  - I have approached patient with the following individualized plan to manage diabetes and patient agrees.  - I will proceed with basal insulin Levemir 20 units QHS, a, associated with strict monitoring of glucose  AC  breakfast . - Patient is warned not to take insulin without proper monitoring per orders. . -Patient is encouraged to call clinic for blood glucose levels less than 70 or above 300 mg /dl. - I will continue metformin 1000 mg extended release once a day after breakfast, therapeutically suitable for patient. -  although it is unlikely for Victoza to cause shortness of breath, she will be given alternative to Victoza Trulicity A999333 mg weekly. Side effects and precautions discussed with her.  - Patient specific target  for A1c; LDL, HDL, Triglycerides, and  Waist Circumference were discussed in detail.  2) BP/HTN: Controlled. Continue current medications including ACEI/ARB. 3) Lipids/HPL:  continue statins. 4)  Weight/Diet: CDE consult in progress, exercise, and carbohydrates information provided.  5) Chronic Care/Health Maintenance:  -Patient is on ACEI/ARB and Statin medications and encouraged to continue to follow up with Ophthalmology, Podiatrist at least yearly or according to recommendations, and advised to  stay away from smoking. I have recommended yearly flu vaccine and pneumonia vaccination at least every 5 years; moderate intensity exercise for up to 150 minutes weekly; and  sleep for at least 7 hours a day.  - 25 minutes of time was spent on the care of this patient , 50% of which was applied for counseling on diabetes complications and their preventions.  - I advised patient to maintain close follow up with HAWKINS,EDWARD L, MD for primary care needs.  Patient is asked to bring meter and  blood glucose  logs during their next visit.   Follow up plan: -No Follow-up on file.  Glade Lloyd, MD Phone: [phone]  Fax: [phone]   02/12/2016, 9:21 AM

## 2016-05-12 ENCOUNTER — Other Ambulatory Visit: Payer: Self-pay | Admitting: "Endocrinology

## 2016-05-12 LAB — LIPID PANEL
CHOL/HDL RATIO: 2.7 ratio (ref <5.0)
Cholesterol: 126 mg/dL (ref <200)
HDL: 46 mg/dL — ABNORMAL LOW (ref >50)
LDL Cholesterol: 58 mg/dL (ref <100)
Triglycerides: 109 mg/dL (ref <150)
VLDL: 22 mg/dL (ref <30)

## 2016-05-12 LAB — COMPREHENSIVE METABOLIC PANEL
ALK PHOS: 57 U/L (ref 33–130)
ALT: 27 U/L (ref 6–29)
AST: 18 U/L (ref 10–35)
Albumin: 4 g/dL (ref 3.6–5.1)
BUN: 12 mg/dL (ref 7–25)
CO2: 26 mmol/L (ref 20–31)
CREATININE: 0.64 mg/dL (ref 0.60–0.93)
Calcium: 9.2 mg/dL (ref 8.6–10.4)
Chloride: 109 mmol/L (ref 98–110)
GLUCOSE: 147 mg/dL — AB (ref 65–99)
POTASSIUM: 4.4 mmol/L (ref 3.5–5.3)
SODIUM: 143 mmol/L (ref 135–146)
Total Bilirubin: 0.4 mg/dL (ref 0.2–1.2)
Total Protein: 5.8 g/dL — ABNORMAL LOW (ref 6.1–8.1)

## 2016-05-12 LAB — HEMOGLOBIN A1C
HEMOGLOBIN A1C: 7.6 % — AB (ref <5.7)
MEAN PLASMA GLUCOSE: 171 mg/dL

## 2016-05-13 LAB — MICROALBUMIN / CREATININE URINE RATIO
Creatinine, Urine: 81 mg/dL (ref 20–320)
MICROALB UR: 0.4 mg/dL
Microalb Creat Ratio: 5 mcg/mg creat (ref <30)

## 2016-05-19 ENCOUNTER — Encounter: Payer: Self-pay | Admitting: "Endocrinology

## 2016-05-19 ENCOUNTER — Ambulatory Visit (INDEPENDENT_AMBULATORY_CARE_PROVIDER_SITE_OTHER): Payer: Medicare Other | Admitting: "Endocrinology

## 2016-05-19 VITALS — BP 126/71 | HR 67 | Ht 67.0 in | Wt 231.0 lb

## 2016-05-19 DIAGNOSIS — E782 Mixed hyperlipidemia: Secondary | ICD-10-CM

## 2016-05-19 DIAGNOSIS — E1165 Type 2 diabetes mellitus with hyperglycemia: Secondary | ICD-10-CM

## 2016-05-19 DIAGNOSIS — E118 Type 2 diabetes mellitus with unspecified complications: Secondary | ICD-10-CM | POA: Diagnosis not present

## 2016-05-19 DIAGNOSIS — IMO0002 Reserved for concepts with insufficient information to code with codable children: Secondary | ICD-10-CM

## 2016-05-19 DIAGNOSIS — Z794 Long term (current) use of insulin: Secondary | ICD-10-CM | POA: Diagnosis not present

## 2016-05-19 DIAGNOSIS — I10 Essential (primary) hypertension: Secondary | ICD-10-CM

## 2016-05-19 MED ORDER — DULAGLUTIDE 1.5 MG/0.5ML ~~LOC~~ SOAJ: 1.5000 mg | SUBCUTANEOUS | 2 refills | Status: DC

## 2016-05-19 MED ORDER — GLUCOSE BLOOD VI STRP: ORAL_STRIP | 2 refills | Status: DC

## 2016-05-19 MED ORDER — METFORMIN HCL 500 MG PO TABS: 1000.0000 mg | ORAL_TABLET | Freq: Every day | ORAL | 3 refills | Status: DC

## 2016-05-19 NOTE — Progress Notes (Signed)
Subjective:    Patient ID: Amanda Hopkins, female    DOB: [birth year], PCP Alonza Bogus, MD   Past Medical History:  Diagnosis Date  . Arthritis   . Diabetes mellitus without complication ([place])   . Hyperlipidemia   . Hypertension   . Obesity   . Palpitations   . PONV (postoperative nausea and vomiting)   . Reflux esophagitis    Past Surgical History:  Procedure Laterality Date  . ABDOMINAL HYSTERECTOMY    . APPENDECTOMY    . CATARACT EXTRACTION Left   . CATARACT EXTRACTION W/PHACO Right 11/18/2013   Procedure: CATARACT EXTRACTION PHACO AND INTRAOCULAR LENS PLACEMENT (IOC);  Surgeon: Tonny Branch, MD;  Location: [facility];  Service: Ophthalmology;  Laterality: Right;  CDE:  7.76  . CESAREAN SECTION     x2  . CHEST WALL BIOPSY Right    removal of fatty tumor  . COLONOSCOPY  11/08/2004   [id number] rectosigmoid polyp/left side diverticula  . COLONOSCOPY  12/2009   RMR: left-sided diverticula, long tortuous colon  . COLONOSCOPY  2003   RMR: carcinoma in situ  . COLONOSCOPY N/A 04/07/2015   Procedure: COLONOSCOPY;  Surgeon: Daneil Dolin, MD;  Location: [facility];  Service: Endoscopy;  Laterality: N/A;  1100 - moved to 11/8 @ 11:30 - office to notify   Social History   Social History  . Marital status: Married    Spouse name: N/A  . Number of children: 2  . Years of education: N/A   Social History Main Topics  . Smoking status: Never Smoker  . Smokeless tobacco: Never Used  . Alcohol use No  . Drug use: No  . Sexual activity: Yes    Birth control/ protection: Surgical   Other Topics Concern  . None   Social History Narrative  . None   Outpatient Encounter Prescriptions as of 05/19/2016  Medication Sig  . Dulaglutide (TRULICITY) 1.5 0000000 SOPN Inject 1.5 mg into the skin once a week.  Marland Kitchen glucose blood (ONE TOUCH ULTRA TEST) test strip Use as instructed  . Insulin Detemir (LEVEMIR FLEXPEN) 100 UNIT/ML SOPN Inject 20 Units into the skin at bedtime.   Marland Kitchen loratadine (CLARITIN) 10 MG tablet Take 10 mg by mouth daily as needed for allergies.  Marland Kitchen losartan (COZAAR) 25 MG tablet Take 25 mg by mouth at bedtime.   . metFORMIN (GLUCOPHAGE) 500 MG tablet Take 2 tablets (1,000 mg total) by mouth daily with breakfast.  . rosuvastatin (CRESTOR) 10 MG tablet Take 10 mg by mouth at bedtime.   . [DISCONTINUED] Dulaglutide (TRULICITY) A999333 0000000 SOPN Inject 0.75 mg into the skin once a week.  . [DISCONTINUED] metFORMIN (GLUCOPHAGE) 500 MG tablet Take 1,500 mg by mouth daily with breakfast.    No facility-administered encounter medications on file as of 05/19/2016.    ALLERGIES: Allergies  Allergen Reactions  . Erythromycin    VACCINATION STATUS:  There is no immunization history on file for this patient.  Diabetes  She presents for her follow-up diabetic visit. She has type 2 diabetes mellitus. Onset time: She was diagnosed at approximate age of 48 years. Her disease course has been stable. There are no hypoglycemic associated symptoms. Pertinent negatives for hypoglycemia include no confusion, headaches, pallor or seizures. There are no diabetic associated symptoms. Pertinent negatives for diabetes include no chest pain, no polydipsia, no polyphagia and no polyuria. There are no hypoglycemic complications. Symptoms are stable. There are no diabetic complications. Risk factors for coronary artery  disease include diabetes mellitus, dyslipidemia, hypertension, obesity and sedentary lifestyle. She is compliant with treatment most of the time. Her weight is stable. She is following a diabetic diet. When asked about meal planning, she reported none. She has had a previous visit with a dietitian. She rarely participates in exercise. Her overall blood glucose range is 140-180 mg/dl. An ACE inhibitor/angiotensin II receptor blocker is being taken.  Hyperlipidemia  This is a chronic problem. The current episode started more than 1 year ago. Pertinent negatives  include no chest pain, myalgias or shortness of breath. Current antihyperlipidemic treatment includes statins.  Hypertension  This is a chronic problem. The current episode started more than 1 year ago. Pertinent negatives include no chest pain, headaches, palpitations or shortness of breath. Risk factors for coronary artery disease include dyslipidemia and diabetes mellitus.     Review of Systems  Constitutional: Negative for chills, fever and unexpected weight change.  HENT: Negative for trouble swallowing and voice change.   Eyes: Negative for visual disturbance.  Respiratory: Negative for cough, shortness of breath and wheezing.        She insists that Victoza is causing her to have shortness of breath.  Cardiovascular: Negative for chest pain, palpitations and leg swelling.  Gastrointestinal: Negative for diarrhea, nausea and vomiting.  Endocrine: Negative for cold intolerance, heat intolerance, polydipsia, polyphagia and polyuria.  Musculoskeletal: Negative for arthralgias and myalgias.  Skin: Negative for color change, pallor, rash and wound.  Neurological: Negative for seizures and headaches.  Psychiatric/Behavioral: Negative for confusion and suicidal ideas.    Objective:    BP 126/71   Pulse 67   Ht 5\' 7"  (1.702 m)   Wt 231 lb (104.8 kg)   BMI 36.18 kg/m   Wt Readings from Last 3 Encounters:  05/19/16 231 lb (104.8 kg)  02/12/16 228 lb 12.8 oz (103.8 kg)  04/07/15 235 lb (106.6 kg)    Physical Exam  Constitutional: She is oriented to person, place, and time. She appears well-developed.  HENT:  Head: Normocephalic and atraumatic.  Eyes: EOM are normal.  Neck: Normal range of motion. Neck supple. No tracheal deviation present. No thyromegaly present.  Cardiovascular: Normal rate and regular rhythm.   Pulmonary/Chest: Effort normal and breath sounds normal.  Abdominal: Soft. Bowel sounds are normal. There is no tenderness. There is no guarding.  Musculoskeletal:  Normal range of motion. She exhibits no edema.  Neurological: She is alert and oriented to person, place, and time. She has normal reflexes. No cranial nerve deficit. Coordination normal.  Skin: Skin is warm and dry. No rash noted. No erythema. No pallor.  Psychiatric: She has a normal mood and affect. Judgment normal.    CMP     Component Value Date/Time   NA 143 05/12/2016 0917   K 4.4 05/12/2016 0917   CL 109 05/12/2016 0917   CO2 26 05/12/2016 0917   GLUCOSE 147 (H) 05/12/2016 0917   BUN 12 05/12/2016 0917   CREATININE 0.64 05/12/2016 0917   CALCIUM 9.2 05/12/2016 0917   PROT 5.8 (L) 05/12/2016 0917   ALBUMIN 4.0 05/12/2016 0917   AST 18 05/12/2016 0917   ALT 27 05/12/2016 0917   ALKPHOS 57 05/12/2016 0917   BILITOT 0.4 05/12/2016 0917   GFRNONAA 88 (L) 11/12/2013 1305   GFRAA >90 11/12/2013 1305    Diabetic Labs (most recent): Lab Results  Component Value Date   HGBA1C 7.6 (H) 05/12/2016   HGBA1C 7.3 (H) 02/05/2016    Assessment &  Plan:   1. Uncontrolled type 2 diabetes mellitus with complication, with long-term current use of insulin ([place])  - patient remains at a high risk for more acute and chronic complications of diabetes which include CAD, CVA, CKD, retinopathy, and neuropathy. These are all discussed in detail with the patient.  Patient came with Controlled fasting glucose profile, and stable  recent A1c of 7.6 %, .  Glucose logs and insulin administration records pertaining to this visit,  to be scanned into patient's records.  Recent labs reviewed.   - I have re-counseled the patient on diet management and weight loss  by adopting a carbohydrate restricted / protein rich  Diet.  - Suggestion is made for patient to avoid simple carbohydrates   from their diet including Cakes , Desserts, Ice Cream,  Soda (  diet and regular) , Sweet Tea , Candies,  Chips, Cookies, Artificial Sweeteners,   and "Sugar-free" Products .  This will help patient to have stable blood  glucose profile and potentially avoid unintended  Weight gain.  - Patient is advised to stick to a routine mealtimes to eat 3 meals  a day and avoid unnecessary snacks ( to snack only to correct hypoglycemia).  - The patient  has been  scheduled with Jearld Fenton, RDN, CDE for individualized DM education.  - I have approached patient with the following individualized plan to manage diabetes and patient agrees.  - I will proceed with basal insulin Levemir 20 units QHS, a, associated with strict monitoring of glucose  AC  breakfast . - Patient is warned not to take insulin without proper monitoring per orders. . -Patient is encouraged to call clinic for blood glucose levels less than 70 or above 300 mg /dl. - I will continue metformin 1000 mg extended release once a day after breakfast, therapeutically suitable for patient. - She has tolerated  Trulicity, I will increase to 1.5 mg weekly. Side effects and precautions discussed with her.    - Patient specific target  for A1c; LDL, HDL, Triglycerides, and  Waist Circumference were discussed in detail.  2) BP/HTN: Controlled. Continue current medications including ACEI/ARB. 3) Lipids/HPL:  continue statins. 4)  Weight/Diet: CDE consult in progress, exercise, and carbohydrates information provided.  5) Chronic Care/Health Maintenance:  -Patient is on ACEI/ARB and Statin medications and encouraged to continue to follow up with Ophthalmology, Podiatrist at least yearly or according to recommendations, and advised to  stay away from smoking. I have recommended yearly flu vaccine and pneumonia vaccination at least every 5 years; moderate intensity exercise for up to 150 minutes weekly; and  sleep for at least 7 hours a day.  - 25 minutes of time was spent on the care of this patient , 50% of which was applied for counseling on diabetes complications and their preventions.  - I advised patient to maintain close follow up with HAWKINS,EDWARD L, MD  for primary care needs.  Patient is asked to bring meter and  blood glucose logs during their next visit.   Follow up plan: -No Follow-up on file.  Glade Lloyd, MD Phone: [phone]  Fax: [phone]   05/19/2016, 11:32 AM

## 2016-08-11 ENCOUNTER — Other Ambulatory Visit: Payer: Self-pay | Admitting: "Endocrinology

## 2016-08-11 LAB — COMPREHENSIVE METABOLIC PANEL
ALT: 28 U/L (ref 6–29)
AST: 21 U/L (ref 10–35)
Albumin: 4.1 g/dL (ref 3.6–5.1)
Alkaline Phosphatase: 71 U/L (ref 33–130)
BILIRUBIN TOTAL: 0.5 mg/dL (ref 0.2–1.2)
BUN: 13 mg/dL (ref 7–25)
CALCIUM: 9.6 mg/dL (ref 8.6–10.4)
CO2: 25 mmol/L (ref 20–31)
Chloride: 108 mmol/L (ref 98–110)
Creat: 0.59 mg/dL — ABNORMAL LOW (ref 0.60–0.93)
Glucose, Bld: 150 mg/dL — ABNORMAL HIGH (ref 65–99)
Potassium: 4.4 mmol/L (ref 3.5–5.3)
Sodium: 142 mmol/L (ref 135–146)
TOTAL PROTEIN: 6 g/dL — AB (ref 6.1–8.1)

## 2016-08-12 LAB — HEMOGLOBIN A1C
HEMOGLOBIN A1C: 7.3 % — AB (ref <5.7)
MEAN PLASMA GLUCOSE: 163 mg/dL

## 2016-08-18 ENCOUNTER — Ambulatory Visit (INDEPENDENT_AMBULATORY_CARE_PROVIDER_SITE_OTHER): Payer: Medicare Other | Admitting: "Endocrinology

## 2016-08-18 ENCOUNTER — Encounter: Payer: Self-pay | Admitting: "Endocrinology

## 2016-08-18 VITALS — BP 140/89 | HR 87 | Ht 67.0 in | Wt 228.0 lb

## 2016-08-18 DIAGNOSIS — E1165 Type 2 diabetes mellitus with hyperglycemia: Secondary | ICD-10-CM

## 2016-08-18 DIAGNOSIS — E118 Type 2 diabetes mellitus with unspecified complications: Secondary | ICD-10-CM | POA: Diagnosis not present

## 2016-08-18 DIAGNOSIS — Z794 Long term (current) use of insulin: Secondary | ICD-10-CM

## 2016-08-18 DIAGNOSIS — I10 Essential (primary) hypertension: Secondary | ICD-10-CM | POA: Diagnosis not present

## 2016-08-18 DIAGNOSIS — E782 Mixed hyperlipidemia: Secondary | ICD-10-CM

## 2016-08-18 DIAGNOSIS — IMO0002 Reserved for concepts with insufficient information to code with codable children: Secondary | ICD-10-CM

## 2016-08-18 MED ORDER — INSULIN DETEMIR 100 UNIT/ML FLEXPEN: 20.0000 [IU] | PEN_INJECTOR | Freq: Every day | SUBCUTANEOUS | 2 refills | Status: DC

## 2016-08-18 NOTE — Progress Notes (Signed)
Subjective:    Patient ID: Amanda Hopkins, female    DOB: January 19, 1946, PCP Alonza Bogus, MD   Past Medical History:  Diagnosis Date  . Arthritis   . Diabetes mellitus without complication ([place])   . Hyperlipidemia   . Hypertension   . Obesity   . Palpitations   . PONV (postoperative nausea and vomiting)   . Reflux esophagitis    Past Surgical History:  Procedure Laterality Date  . ABDOMINAL HYSTERECTOMY    . APPENDECTOMY    . CATARACT EXTRACTION Left   . CATARACT EXTRACTION W/PHACO Right 11/18/2013   Procedure: CATARACT EXTRACTION PHACO AND INTRAOCULAR LENS PLACEMENT (IOC);  Surgeon: Tonny Branch, MD;  Location: [facility];  Service: Ophthalmology;  Laterality: Right;  CDE:  7.76  . CESAREAN SECTION     x2  . CHEST WALL BIOPSY Right    removal of fatty tumor  . COLONOSCOPY  11/08/2004   [id number] rectosigmoid polyp/left side diverticula  . COLONOSCOPY  12/2009   RMR: left-sided diverticula, long tortuous colon  . COLONOSCOPY  2003   RMR: carcinoma in situ  . COLONOSCOPY N/A 04/07/2015   Procedure: COLONOSCOPY;  Surgeon: Daneil Dolin, MD;  Location: [facility];  Service: Endoscopy;  Laterality: N/A;  1100 - moved to 11/8 @ 11:30 - office to notify   Social History   Social History  . Marital status: Married    Spouse name: N/A  . Number of children: 2  . Years of education: N/A   Social History Main Topics  . Smoking status: Never Smoker  . Smokeless tobacco: Never Used  . Alcohol use No  . Drug use: No  . Sexual activity: Yes    Birth control/ protection: Surgical   Other Topics Concern  . None   Social History Narrative  . None   Outpatient Encounter Prescriptions as of 08/18/2016  Medication Sig  . ibuprofen (ADVIL,MOTRIN) 200 MG tablet Take 200 mg by mouth every 6 (six) hours as needed.  . Dulaglutide (TRULICITY) 1.5 XY/8.0XK SOPN Inject 1.5 mg into the skin once a week.  Marland Kitchen glucose blood (ONE TOUCH ULTRA TEST) test strip Use as instructed  .  Insulin Detemir (LEVEMIR FLEXPEN) 100 UNIT/ML Pen Inject 20 Units into the skin at bedtime.  Marland Kitchen loratadine (CLARITIN) 10 MG tablet Take 10 mg by mouth daily as needed for allergies.  Marland Kitchen losartan (COZAAR) 25 MG tablet Take 25 mg by mouth at bedtime.   . metFORMIN (GLUCOPHAGE) 500 MG tablet Take 2 tablets (1,000 mg total) by mouth daily with breakfast.  . rosuvastatin (CRESTOR) 10 MG tablet Take 10 mg by mouth at bedtime.   . [DISCONTINUED] Insulin Detemir (LEVEMIR FLEXPEN) 100 UNIT/ML SOPN Inject 20 Units into the skin at bedtime.   No facility-administered encounter medications on file as of 08/18/2016.    ALLERGIES: Allergies  Allergen Reactions  . Erythromycin    VACCINATION STATUS:  There is no immunization history on file for this patient.  Diabetes  She presents for her follow-up diabetic visit. She has type 2 diabetes mellitus. Onset time: She was diagnosed at approximate age of 39 years. Her disease course has been improving. There are no hypoglycemic associated symptoms. Pertinent negatives for hypoglycemia include no confusion, headaches, pallor or seizures. There are no diabetic associated symptoms. Pertinent negatives for diabetes include no chest pain, no polydipsia, no polyphagia and no polyuria. There are no hypoglycemic complications. Symptoms are improving. There are no diabetic complications. Risk factors for  coronary artery disease include diabetes mellitus, dyslipidemia, hypertension, obesity and sedentary lifestyle. She is compliant with treatment most of the time. Her weight is decreasing steadily. She is following a diabetic diet. When asked about meal planning, she reported none. She has had a previous visit with a dietitian. She rarely participates in exercise. Her overall blood glucose range is 140-180 mg/dl. An ACE inhibitor/angiotensin II receptor blocker is being taken.  Hyperlipidemia  This is a chronic problem. The current episode started more than 1 year ago.  Pertinent negatives include no chest pain, myalgias or shortness of breath. Current antihyperlipidemic treatment includes statins.  Hypertension  This is a chronic problem. The current episode started more than 1 year ago. Pertinent negatives include no chest pain, headaches, palpitations or shortness of breath. Risk factors for coronary artery disease include dyslipidemia and diabetes mellitus.     Review of Systems  Constitutional: Negative for chills, fever and unexpected weight change.  HENT: Negative for trouble swallowing and voice change.   Eyes: Negative for visual disturbance.  Respiratory: Negative for cough, shortness of breath and wheezing.   Cardiovascular: Negative for chest pain, palpitations and leg swelling.  Gastrointestinal: Negative for diarrhea, nausea and vomiting.  Endocrine: Negative for cold intolerance, heat intolerance, polydipsia, polyphagia and polyuria.  Musculoskeletal: Negative for arthralgias and myalgias.  Skin: Negative for color change, pallor, rash and wound.  Neurological: Negative for seizures and headaches.  Psychiatric/Behavioral: Negative for confusion and suicidal ideas.    Objective:    BP 140/89   Pulse 87   Ht 5\' 7"  (1.702 m)   Wt 228 lb (103.4 kg)   BMI 35.71 kg/m   Wt Readings from Last 3 Encounters:  08/18/16 228 lb (103.4 kg)  05/19/16 231 lb (104.8 kg)  02/12/16 228 lb 12.8 oz (103.8 kg)    Physical Exam  Constitutional: She is oriented to person, place, and time. She appears well-developed.  HENT:  Head: Normocephalic and atraumatic.  Eyes: EOM are normal.  Neck: Normal range of motion. Neck supple. No tracheal deviation present. No thyromegaly present.  Cardiovascular: Normal rate and regular rhythm.   Pulmonary/Chest: Effort normal and breath sounds normal.  Abdominal: Soft. Bowel sounds are normal. There is no tenderness. There is no guarding.  Musculoskeletal: Normal range of motion. She exhibits no edema.   Neurological: She is alert and oriented to person, place, and time. She has normal reflexes. No cranial nerve deficit. Coordination normal.  Skin: Skin is warm and dry. No rash noted. No erythema. No pallor.  Psychiatric: She has a normal mood and affect. Judgment normal.    CMP     Component Value Date/Time   NA 142 08/11/2016 0822   K 4.4 08/11/2016 0822   CL 108 08/11/2016 0822   CO2 25 08/11/2016 0822   GLUCOSE 150 (H) 08/11/2016 0822   BUN 13 08/11/2016 0822   CREATININE 0.59 (L) 08/11/2016 0822   CALCIUM 9.6 08/11/2016 0822   PROT 6.0 (L) 08/11/2016 0822   ALBUMIN 4.1 08/11/2016 0822   AST 21 08/11/2016 0822   ALT 28 08/11/2016 0822   ALKPHOS 71 08/11/2016 0822   BILITOT 0.5 08/11/2016 0822   GFRNONAA 88 (L) 11/12/2013 1305   GFRAA >90 11/12/2013 1305    Diabetic Labs (most recent): Lab Results  Component Value Date   HGBA1C 7.3 (H) 08/11/2016   HGBA1C 7.6 (H) 05/12/2016   HGBA1C 7.3 (H) 02/05/2016    Assessment & Plan:   1. Uncontrolled type 2 diabetes  mellitus with complication, with long-term current use of insulin ([place])  - patient remains at a high risk for more acute and chronic complications of diabetes which include CAD, CVA, CKD, retinopathy, and neuropathy. These are all discussed in detail with the patient.  Patient came with Controlled fasting glucose profile, and stable  recent A1c of 7.3 %, .  Glucose logs and insulin administration records pertaining to this visit,  to be scanned into patient's records.  Recent labs reviewed.   - I have re-counseled the patient on diet management and weight loss  by adopting a carbohydrate restricted / protein rich  Diet.  - Suggestion is made for patient to avoid simple carbohydrates   from their diet including Cakes , Desserts, Ice Cream,  Soda (  diet and regular) , Sweet Tea , Candies,  Chips, Cookies, Artificial Sweeteners,   and "Sugar-free" Products .  This will help patient to have stable blood glucose  profile and potentially avoid unintended  Weight gain.  - Patient is advised to stick to a routine mealtimes to eat 3 meals  a day and avoid unnecessary snacks ( to snack only to correct hypoglycemia).  - The patient  has been  scheduled with Jearld Fenton, RDN, CDE for individualized DM education.  - I have approached patient with the following individualized plan to manage diabetes and patient agrees.  - I will proceed with basal insulin Levemir 20 units QHS,  associated with strict monitoring of glucose  AC  breakfast . - Patient is warned not to take insulin without proper monitoring per orders.  -Patient is encouraged to call clinic for blood glucose levels less than 70 or above 300 mg /dl. - I will continue metformin 1000 mg extended release once a day after breakfast, therapeutically suitable for patient. - She has tolerated  Trulicity, I will continue 1.5 mg weekly. Side effects and precautions discussed with her.    - Patient specific target  for A1c; LDL, HDL, Triglycerides, and  Waist Circumference were discussed in detail.  2) BP/HTN: Controlled. Continue current medications including ACEI/ARB. 3) Lipids/HPL:  continue statins. 4)  Weight/Diet: CDE consult in progress, exercise, and carbohydrates information provided.  5) Chronic Care/Health Maintenance:  -Patient is on ACEI/ARB and Statin medications and encouraged to continue to follow up with Ophthalmology, Podiatrist at least yearly or according to recommendations, and advised to  stay away from smoking. I have recommended yearly flu vaccine and pneumonia vaccination at least every 5 years; moderate intensity exercise for up to 150 minutes weekly; and  sleep for at least 7 hours a day.  - 25 minutes of time was spent on the care of this patient , 50% of which was applied for counseling on diabetes complications and their preventions.  - I advised patient to maintain close follow up with HAWKINS,EDWARD L, MD for primary  care needs.  Patient is asked to bring meter and  blood glucose logs during their next visit.   Follow up plan: -Return in about 4 months (around 12/18/2016) for follow up with pre-visit labs, meter, and logs.  Glade Lloyd, MD Phone: [phone]  Fax: [phone]   08/18/2016, 2:29 PM

## 2016-08-18 NOTE — Patient Instructions (Signed)

## 2016-09-02 ENCOUNTER — Other Ambulatory Visit: Payer: Self-pay | Admitting: "Endocrinology

## 2016-10-01 ENCOUNTER — Other Ambulatory Visit: Payer: Self-pay | Admitting: "Endocrinology

## 2016-11-19 ENCOUNTER — Other Ambulatory Visit: Payer: Self-pay | Admitting: "Endocrinology

## 2016-12-12 ENCOUNTER — Other Ambulatory Visit: Payer: Self-pay | Admitting: "Endocrinology

## 2016-12-12 LAB — COMPREHENSIVE METABOLIC PANEL
ALT: 33 U/L — ABNORMAL HIGH (ref 6–29)
AST: 24 U/L (ref 10–35)
Albumin: 4.1 g/dL (ref 3.6–5.1)
Alkaline Phosphatase: 71 U/L (ref 33–130)
BILIRUBIN TOTAL: 0.5 mg/dL (ref 0.2–1.2)
BUN: 10 mg/dL (ref 7–25)
CO2: 25 mmol/L (ref 20–31)
Calcium: 9.4 mg/dL (ref 8.6–10.4)
Chloride: 108 mmol/L (ref 98–110)
Creat: 0.7 mg/dL (ref 0.60–0.93)
GLUCOSE: 171 mg/dL — AB (ref 65–99)
POTASSIUM: 4.2 mmol/L (ref 3.5–5.3)
SODIUM: 141 mmol/L (ref 135–146)
TOTAL PROTEIN: 6 g/dL — AB (ref 6.1–8.1)

## 2016-12-13 LAB — HEMOGLOBIN A1C
HEMOGLOBIN A1C: 8.2 % — AB (ref <5.7)
Mean Plasma Glucose: 189 mg/dL

## 2016-12-19 ENCOUNTER — Encounter: Payer: Self-pay | Admitting: "Endocrinology

## 2016-12-19 ENCOUNTER — Ambulatory Visit (INDEPENDENT_AMBULATORY_CARE_PROVIDER_SITE_OTHER): Payer: Medicare Other | Admitting: "Endocrinology

## 2016-12-19 VITALS — BP 136/84 | HR 81 | Wt 228.0 lb

## 2016-12-19 DIAGNOSIS — Z6835 Body mass index (BMI) 35.0-35.9, adult: Secondary | ICD-10-CM

## 2016-12-19 DIAGNOSIS — E66812 Obesity, class 2: Secondary | ICD-10-CM | POA: Insufficient documentation

## 2016-12-19 DIAGNOSIS — Z794 Long term (current) use of insulin: Secondary | ICD-10-CM

## 2016-12-19 DIAGNOSIS — I10 Essential (primary) hypertension: Secondary | ICD-10-CM

## 2016-12-19 DIAGNOSIS — E1165 Type 2 diabetes mellitus with hyperglycemia: Secondary | ICD-10-CM | POA: Diagnosis not present

## 2016-12-19 DIAGNOSIS — E118 Type 2 diabetes mellitus with unspecified complications: Secondary | ICD-10-CM

## 2016-12-19 DIAGNOSIS — E782 Mixed hyperlipidemia: Secondary | ICD-10-CM

## 2016-12-19 DIAGNOSIS — IMO0002 Reserved for concepts with insufficient information to code with codable children: Secondary | ICD-10-CM

## 2016-12-19 MED ORDER — DULAGLUTIDE 1.5 MG/0.5ML ~~LOC~~ SOAJ: SUBCUTANEOUS | 0 refills | Status: DC

## 2016-12-19 MED ORDER — INSULIN DETEMIR 100 UNIT/ML FLEXPEN: 30.0000 [IU] | PEN_INJECTOR | Freq: Every day | SUBCUTANEOUS | 2 refills | Status: DC

## 2016-12-19 NOTE — Progress Notes (Signed)
Subjective:    Patient ID: Amanda Hopkins, female    DOB: 09-18-1945, PCP Sinda Du, MD   Past Medical History:  Diagnosis Date  . Arthritis   . Diabetes mellitus without complication ([place])   . Hyperlipidemia   . Hypertension   . Obesity   . Palpitations   . PONV (postoperative nausea and vomiting)   . Reflux esophagitis    Past Surgical History:  Procedure Laterality Date  . ABDOMINAL HYSTERECTOMY    . APPENDECTOMY    . CATARACT EXTRACTION Left   . CATARACT EXTRACTION W/PHACO Right 11/18/2013   Procedure: CATARACT EXTRACTION PHACO AND INTRAOCULAR LENS PLACEMENT (IOC);  Surgeon: Tonny Branch, MD;  Location: [facility];  Service: Ophthalmology;  Laterality: Right;  CDE:  7.76  . CESAREAN SECTION     x2  . CHEST WALL BIOPSY Right    removal of fatty tumor  . COLONOSCOPY  11/08/2004   [id number] rectosigmoid polyp/left side diverticula  . COLONOSCOPY  12/2009   RMR: left-sided diverticula, long tortuous colon  . COLONOSCOPY  2003   RMR: carcinoma in situ  . COLONOSCOPY N/A 04/07/2015   Procedure: COLONOSCOPY;  Surgeon: Daneil Dolin, MD;  Location: [facility];  Service: Endoscopy;  Laterality: N/A;  1100 - moved to 11/8 @ 11:30 - office to notify   Social History   Social History  . Marital status: Married    Spouse name: N/A  . Number of children: 2  . Years of education: N/A   Social History Main Topics  . Smoking status: Never Smoker  . Smokeless tobacco: Never Used  . Alcohol use No  . Drug use: No  . Sexual activity: Yes    Birth control/ protection: Surgical   Other Topics Concern  . None   Social History Narrative  . None   Outpatient Encounter Prescriptions as of 12/19/2016  Medication Sig  . Dulaglutide (TRULICITY) 1.5 WG/6.6ZL SOPN INJECT 1.5 MG INTO THE SKIN ONCE A WEEK  . glucose blood (ONE TOUCH ULTRA TEST) test strip Use as instructed  . ibuprofen (ADVIL,MOTRIN) 200 MG tablet Take 200 mg by mouth every 6 (six) hours as needed.  .  Insulin Detemir (LEVEMIR FLEXPEN) 100 UNIT/ML Pen Inject 30 Units into the skin at bedtime.  Marland Kitchen loratadine (CLARITIN) 10 MG tablet Take 10 mg by mouth daily as needed for allergies.  Marland Kitchen losartan (COZAAR) 25 MG tablet Take 25 mg by mouth at bedtime.   . metFORMIN (GLUCOPHAGE) 500 MG tablet TAKE 2 TABLETS BY MOUTH EVERY DAY WITH BREAKFAST  . rosuvastatin (CRESTOR) 10 MG tablet Take 10 mg by mouth at bedtime.   . [DISCONTINUED] Insulin Detemir (LEVEMIR FLEXPEN) 100 UNIT/ML Pen Inject 20 Units into the skin at bedtime.  . [DISCONTINUED] TRULICITY 1.5 DJ/5.7SV SOPN INJECT 1.5MG  INTO THE SKIN ONCE A WEEK   No facility-administered encounter medications on file as of 12/19/2016.    ALLERGIES: Allergies  Allergen Reactions  . Erythromycin    VACCINATION STATUS:  There is no immunization history on file for this patient.  Diabetes  She presents for her follow-up diabetic visit. She has type 2 diabetes mellitus. Onset time: She was diagnosed at approximate age of 38 years. Her disease course has been improving. There are no hypoglycemic associated symptoms. Pertinent negatives for hypoglycemia include no confusion, headaches, pallor or seizures. There are no diabetic associated symptoms. Pertinent negatives for diabetes include no chest pain, no polydipsia, no polyphagia and no polyuria. There are no  hypoglycemic complications. Symptoms are improving. There are no diabetic complications. Risk factors for coronary artery disease include diabetes mellitus, dyslipidemia, hypertension, obesity and sedentary lifestyle. She is compliant with treatment most of the time. Her weight is decreasing steadily. She is following a diabetic diet. When asked about meal planning, she reported none. She has had a previous visit with a dietitian. She rarely participates in exercise. Her overall blood glucose range is 140-180 mg/dl. An ACE inhibitor/angiotensin II receptor blocker is being taken.  Hyperlipidemia  This is a  chronic problem. The current episode started more than 1 year ago. Pertinent negatives include no chest pain, myalgias or shortness of breath. Current antihyperlipidemic treatment includes statins.  Hypertension  This is a chronic problem. The current episode started more than 1 year ago. Pertinent negatives include no chest pain, headaches, palpitations or shortness of breath. Risk factors for coronary artery disease include dyslipidemia and diabetes mellitus.     Review of Systems  Constitutional: Negative for chills, fever and unexpected weight change.  HENT: Negative for trouble swallowing and voice change.   Eyes: Negative for visual disturbance.  Respiratory: Negative for cough, shortness of breath and wheezing.   Cardiovascular: Negative for chest pain, palpitations and leg swelling.  Gastrointestinal: Negative for diarrhea, nausea and vomiting.  Endocrine: Negative for cold intolerance, heat intolerance, polydipsia, polyphagia and polyuria.  Musculoskeletal: Negative for arthralgias and myalgias.  Skin: Negative for color change, pallor, rash and wound.  Neurological: Negative for seizures and headaches.  Psychiatric/Behavioral: Negative for confusion and suicidal ideas.    Objective:    BP 136/84   Pulse 81   Wt 228 lb (103.4 kg)   BMI 35.71 kg/m   Wt Readings from Last 3 Encounters:  12/19/16 228 lb (103.4 kg)  08/18/16 228 lb (103.4 kg)  05/19/16 231 lb (104.8 kg)    Physical Exam  Constitutional: She is oriented to person, place, and time. She appears well-developed.  HENT:  Head: Normocephalic and atraumatic.  Eyes: EOM are normal.  Neck: Normal range of motion. Neck supple. No tracheal deviation present. No thyromegaly present.  Cardiovascular: Normal rate and regular rhythm.   Pulmonary/Chest: Effort normal and breath sounds normal.  Abdominal: Soft. Bowel sounds are normal. There is no tenderness. There is no guarding.  Musculoskeletal: Normal range of motion.  She exhibits no edema.  Neurological: She is alert and oriented to person, place, and time. She has normal reflexes. No cranial nerve deficit. Coordination normal.  Skin: Skin is warm and dry. No rash noted. No erythema. No pallor.  Psychiatric: She has a normal mood and affect. Judgment normal.    CMP     Component Value Date/Time   NA 141 12/12/2016 0837   K 4.2 12/12/2016 0837   CL 108 12/12/2016 0837   CO2 25 12/12/2016 0837   GLUCOSE 171 (H) 12/12/2016 0837   BUN 10 12/12/2016 0837   CREATININE 0.70 12/12/2016 0837   CALCIUM 9.4 12/12/2016 0837   PROT 6.0 (L) 12/12/2016 0837   ALBUMIN 4.1 12/12/2016 0837   AST 24 12/12/2016 0837   ALT 33 (H) 12/12/2016 0837   ALKPHOS 71 12/12/2016 0837   BILITOT 0.5 12/12/2016 0837   GFRNONAA 88 (L) 11/12/2013 1305   GFRAA >90 11/12/2013 1305    Diabetic Labs (most recent): Lab Results  Component Value Date   HGBA1C 8.2 (H) 12/12/2016   HGBA1C 7.3 (H) 08/11/2016   HGBA1C 7.6 (H) 05/12/2016    Assessment & Plan:   1.  Uncontrolled type 2 diabetes mellitus with complication, with long-term current use of insulin ([place])  - patient remains at a high risk for more acute and chronic complications of diabetes which include CAD, CVA, CKD, retinopathy, and neuropathy. These are all discussed in detail with the patient.  Patient came with Above target fasting glucose profile, and stable  recent A1c of 8.2% increasing from  7.3 %, .  Glucose logs and insulin administration records pertaining to this visit,  to be scanned into patient's records.  Recent labs reviewed.   - I have re-counseled the patient on diet management and weight loss  by adopting a carbohydrate restricted / protein rich  Diet.  - Suggestion is made for patient to avoid simple carbohydrates   from her diet including Cakes , Desserts, Ice Cream,  Soda (  diet and regular) , Sweet Tea , Candies,  Chips, Cookies, Artificial Sweeteners,   and "Sugar-free" Products .  This will  help patient to have stable blood glucose profile and potentially avoid unintended  Weight gain.  - Patient is advised to stick to a routine mealtimes to eat 3 meals  a day and avoid unnecessary snacks ( to snack only to correct hypoglycemia).  - The patient  has been  scheduled with Jearld Fenton, RDN, CDE for individualized DM education.  - I have approached patient with the following individualized plan to manage diabetes and patient agrees.  - I will increase basal insulin Levemir to 30 units daily at bedtime,  associated with strict monitoring of glucose  AC  breakfast . - Patient is warned not to take insulin without proper monitoring per orders.  -Patient is encouraged to call clinic for blood glucose levels less than 70 or above 200 mg /dl. - I will continue metformin 1000 mg extended release once a day after breakfast, therapeutically suitable for patient. - She has tolerated  Trulicity, I will continue 1.5 mg weekly. Side effects and precautions discussed with her.    - Patient specific target  for A1c; LDL, HDL, Triglycerides, and  Waist Circumference were discussed in detail.  2) BP/HTN: Controlled. Continue current medications including ACEI/ARB. 3) Lipids/HPL:  continue statins. 4)  Weight/Diet: CDE consult in progress, exercise, and carbohydrates information provided.  5) Chronic Care/Health Maintenance:  -Patient is on ACEI/ARB and Statin medications and encouraged to continue to follow up with Ophthalmology, Podiatrist at least yearly or according to recommendations, and advised to  stay away from smoking. I have recommended yearly flu vaccine and pneumonia vaccination at least every 5 years; moderate intensity exercise for up to 150 minutes weekly; and  sleep for at least 7 hours a day.  - 25 minutes of time was spent on the care of this patient , 50% of which was applied for counseling on diabetes complications and their preventions.  - I advised patient to maintain  close follow up with Sinda Du, MD for primary care needs.  Patient is asked to bring meter and  blood glucose logs during her next visit.   Follow up plan: -Return in about 4 months (around 04/21/2017) for meter, and logs.  Glade Lloyd, MD Phone: [phone]  Fax: [phone]   12/19/2016, 9:13 AM

## 2016-12-19 NOTE — Patient Instructions (Signed)
Advice for weight management -For most of us the best way to lose weight is by diet management. Generally speaking, diet management means restricting carbohydrate consumption to minimum possible (and to unprocessed or minimally processed complex starch) and increasing protein intake (animal or plant source), fruits, and vegetables.  -Sticking to a routine mealtime to eat 3 meals a day and avoiding unnecessary snacks is shown to have a big role in weight control.  -It is better to avoid simple carbohydrates including: Cakes, Desserts, Ice Cream, Soda (diet and regular), Sweet Tea, Candies, Chips, Cookies, Artificial Sweeteners, and "Sugar-free" Products.   -Exercise: 30 minutes a day 3-4 days a week, or 150 minutes a week. Combine stretch, strength, and aerobic activities. You may seek evaluation by your heart doctor prior to initiating exercise if you have high risk for heart disease.  -If you are interested, we can schedule a visit with Amanda Hopkins, RDN, CDE for individualized nutrition education.  

## 2017-01-02 ENCOUNTER — Telehealth: Payer: Self-pay | Admitting: "Endocrinology

## 2017-01-02 ENCOUNTER — Other Ambulatory Visit: Payer: Self-pay | Admitting: "Endocrinology

## 2017-01-02 MED ORDER — DULAGLUTIDE 1.5 MG/0.5ML ~~LOC~~ SOAJ: SUBCUTANEOUS | 0 refills | Status: DC

## 2017-01-02 NOTE — Telephone Encounter (Signed)
I will send in a prescription to her pharmacy.

## 2017-01-02 NOTE — Telephone Encounter (Signed)
[name] from [facility] is calling asking for a refill on Amanda Hopkins Dulaglutide (TRULICITY) 1.5 EX/4.6AC SOPN requesting a 90 day supply please advise?

## 2017-01-02 NOTE — Telephone Encounter (Signed)
NOTED

## 2017-01-09 ENCOUNTER — Other Ambulatory Visit: Payer: Self-pay

## 2017-01-09 MED ORDER — DULAGLUTIDE 1.5 MG/0.5ML ~~LOC~~ SOAJ: SUBCUTANEOUS | 0 refills | Status: DC

## 2017-01-12 ENCOUNTER — Other Ambulatory Visit: Payer: Self-pay | Admitting: "Endocrinology

## 2017-03-14 LAB — HEMOGLOBIN A1C
HEMOGLOBIN A1C: 7 %{Hb} — AB (ref <5.7)
Mean Plasma Glucose: 154 (calc)
eAG (mmol/L): 8.5 (calc)

## 2017-03-14 LAB — TSH: TSH: 0.24 m[IU]/L — AB (ref 0.40–4.50)

## 2017-03-14 LAB — RENAL FUNCTION PANEL
Albumin: 4.1 g/dL (ref 3.6–5.1)
BUN: 11 mg/dL (ref 7–25)
CHLORIDE: 106 mmol/L (ref 98–110)
CO2: 30 mmol/L (ref 20–32)
CREATININE: 0.83 mg/dL (ref 0.60–0.93)
Calcium: 9.5 mg/dL (ref 8.6–10.4)
GLUCOSE: 236 mg/dL — AB (ref 65–139)
PHOSPHORUS: 4.3 mg/dL (ref 2.1–4.3)
POTASSIUM: 4.3 mmol/L (ref 3.5–5.3)
Sodium: 141 mmol/L (ref 135–146)

## 2017-03-14 LAB — T4, FREE: Free T4: 1.3 ng/dL (ref 0.8–1.8)

## 2017-03-22 ENCOUNTER — Ambulatory Visit (INDEPENDENT_AMBULATORY_CARE_PROVIDER_SITE_OTHER): Payer: Medicare Other | Admitting: "Endocrinology

## 2017-03-22 ENCOUNTER — Encounter: Payer: Self-pay | Admitting: "Endocrinology

## 2017-03-22 VITALS — BP 135/79 | HR 76 | Ht 67.0 in | Wt 227.0 lb

## 2017-03-22 DIAGNOSIS — E1165 Type 2 diabetes mellitus with hyperglycemia: Secondary | ICD-10-CM | POA: Diagnosis not present

## 2017-03-22 DIAGNOSIS — Z794 Long term (current) use of insulin: Secondary | ICD-10-CM | POA: Diagnosis not present

## 2017-03-22 DIAGNOSIS — E782 Mixed hyperlipidemia: Secondary | ICD-10-CM

## 2017-03-22 DIAGNOSIS — IMO0002 Reserved for concepts with insufficient information to code with codable children: Secondary | ICD-10-CM

## 2017-03-22 DIAGNOSIS — Z6835 Body mass index (BMI) 35.0-35.9, adult: Secondary | ICD-10-CM

## 2017-03-22 DIAGNOSIS — E118 Type 2 diabetes mellitus with unspecified complications: Secondary | ICD-10-CM | POA: Diagnosis not present

## 2017-03-22 DIAGNOSIS — I10 Essential (primary) hypertension: Secondary | ICD-10-CM

## 2017-03-22 MED ORDER — INSULIN DEGLUDEC 100 UNIT/ML ~~LOC~~ SOPN: 20.0000 [IU] | PEN_INJECTOR | Freq: Every day | SUBCUTANEOUS | 2 refills | Status: DC

## 2017-03-22 MED ORDER — METFORMIN HCL 500 MG PO TABS: ORAL_TABLET | ORAL | 2 refills | Status: DC

## 2017-03-22 NOTE — Progress Notes (Signed)
Subjective:    Patient ID: Amanda Hopkins, female    DOB: 06-29-1945, PCP Sinda Du, MD   Past Medical History:  Diagnosis Date  . Arthritis   . Diabetes mellitus without complication ([place])   . Hyperlipidemia   . Hypertension   . Obesity   . Palpitations   . PONV (postoperative nausea and vomiting)   . Reflux esophagitis    Past Surgical History:  Procedure Laterality Date  . ABDOMINAL HYSTERECTOMY    . APPENDECTOMY    . CATARACT EXTRACTION Left   . CATARACT EXTRACTION W/PHACO Right 11/18/2013   Procedure: CATARACT EXTRACTION PHACO AND INTRAOCULAR LENS PLACEMENT (IOC);  Surgeon: Tonny Branch, MD;  Location: [facility];  Service: Ophthalmology;  Laterality: Right;  CDE:  7.76  . CESAREAN SECTION     x2  . CHEST WALL BIOPSY Right    removal of fatty tumor  . COLONOSCOPY  11/08/2004   [id number] rectosigmoid polyp/left side diverticula  . COLONOSCOPY  12/2009   RMR: left-sided diverticula, long tortuous colon  . COLONOSCOPY  2003   RMR: carcinoma in situ  . COLONOSCOPY N/A 04/07/2015   Procedure: COLONOSCOPY;  Surgeon: Daneil Dolin, MD;  Location: [facility];  Service: Endoscopy;  Laterality: N/A;  1100 - moved to 11/8 @ 11:30 - office to notify   Social History   Social History  . Marital status: Married    Spouse name: N/A  . Number of children: 2  . Years of education: N/A   Social History Main Topics  . Smoking status: Never Smoker  . Smokeless tobacco: Never Used  . Alcohol use No  . Drug use: No  . Sexual activity: Yes    Birth control/ protection: Surgical   Other Topics Concern  . None   Social History Narrative  . None   Outpatient Encounter Prescriptions as of 03/22/2017  Medication Sig  . Dulaglutide (TRULICITY) 1.5 JK/0.9FG SOPN INJECT 1.5 MG INTO THE SKIN ONCE A WEEK  . glucose blood (ONE TOUCH ULTRA TEST) test strip Use as instructed  . ibuprofen (ADVIL,MOTRIN) 200 MG tablet Take 200 mg by mouth every 6 (six) hours as needed.  .  insulin degludec (TRESIBA FLEXTOUCH) 100 UNIT/ML SOPN FlexTouch Pen Inject 0.2 mLs (20 Units total) into the skin daily at 10 pm.  . loratadine (CLARITIN) 10 MG tablet Take 10 mg by mouth daily as needed for allergies.  Marland Kitchen losartan (COZAAR) 25 MG tablet Take 25 mg by mouth at bedtime.   . metFORMIN (GLUCOPHAGE) 500 MG tablet TAKE TWO TABLETS BY MOUTH DAILY WITH BREAKFAST  . rosuvastatin (CRESTOR) 10 MG tablet Take 10 mg by mouth at bedtime.   . [DISCONTINUED] Insulin Detemir (LEVEMIR FLEXPEN) 100 UNIT/ML Pen Inject 30 Units into the skin at bedtime.  . [DISCONTINUED] metFORMIN (GLUCOPHAGE) 500 MG tablet TAKE TWO TABLETS BY MOUTH DAILY WITH BREAKFAST   No facility-administered encounter medications on file as of 03/22/2017.    ALLERGIES: Allergies  Allergen Reactions  . Erythromycin    VACCINATION STATUS:  There is no immunization history on file for this patient.  Diabetes  She presents for her follow-up diabetic visit. She has type 2 diabetes mellitus. Onset time: She was diagnosed at approximate age of 12 years. Her disease course has been improving. There are no hypoglycemic associated symptoms. Pertinent negatives for hypoglycemia include no confusion, headaches, pallor or seizures. There are no diabetic associated symptoms. Pertinent negatives for diabetes include no chest pain, no polydipsia, no  polyphagia and no polyuria. There are no hypoglycemic complications. Symptoms are improving. There are no diabetic complications. Risk factors for coronary artery disease include diabetes mellitus, dyslipidemia, hypertension, obesity and sedentary lifestyle. She is compliant with treatment most of the time. Her weight is stable. She is following a diabetic diet. When asked about meal planning, she reported none. She has had a previous visit with a dietitian. She rarely participates in exercise. Her breakfast blood glucose range is generally 140-180 mg/dl. Her overall blood glucose range is 140-180  mg/dl. An ACE inhibitor/angiotensin II receptor blocker is being taken.  Hyperlipidemia  This is a chronic problem. The current episode started more than 1 year ago. Pertinent negatives include no chest pain, myalgias or shortness of breath. Current antihyperlipidemic treatment includes statins.  Hypertension  This is a chronic problem. The current episode started more than 1 year ago. Pertinent negatives include no chest pain, headaches, palpitations or shortness of breath. Risk factors for coronary artery disease include dyslipidemia and diabetes mellitus.    Review of Systems  Constitutional: Negative for chills, fever and unexpected weight change.  HENT: Negative for trouble swallowing and voice change.   Eyes: Negative for visual disturbance.  Respiratory: Negative for cough, shortness of breath and wheezing.   Cardiovascular: Negative for chest pain, palpitations and leg swelling.  Gastrointestinal: Negative for diarrhea, nausea and vomiting.  Endocrine: Negative for cold intolerance, heat intolerance, polydipsia, polyphagia and polyuria.  Musculoskeletal: Negative for arthralgias and myalgias.  Skin: Negative for color change, pallor, rash and wound.  Neurological: Negative for seizures and headaches.  Psychiatric/Behavioral: Negative for confusion and suicidal ideas.    Objective:    BP 135/79   Pulse 76   Ht 5\' 7"  (1.702 m)   Wt 227 lb (103 kg)   BMI 35.55 kg/m   Wt Readings from Last 3 Encounters:  03/22/17 227 lb (103 kg)  12/19/16 228 lb (103.4 kg)  08/18/16 228 lb (103.4 kg)    Physical Exam  Constitutional: She is oriented to person, place, and time. She appears well-developed.  HENT:  Head: Normocephalic and atraumatic.  Eyes: EOM are normal.  Neck: Normal range of motion. Neck supple. No tracheal deviation present. No thyromegaly present.  Cardiovascular: Normal rate and regular rhythm.   Pulmonary/Chest: Effort normal and breath sounds normal.  Abdominal:  Soft. Bowel sounds are normal. There is no tenderness. There is no guarding.  Musculoskeletal: Normal range of motion. She exhibits no edema.  Neurological: She is alert and oriented to person, place, and time. She has normal reflexes. No cranial nerve deficit. Coordination normal.  Skin: Skin is warm and dry. No rash noted. No erythema. No pallor.  Psychiatric: She has a normal mood and affect. Judgment normal.    CMP     Component Value Date/Time   NA 141 03/13/2017 1402   K 4.3 03/13/2017 1402   CL 106 03/13/2017 1402   CO2 30 03/13/2017 1402   GLUCOSE 236 (H) 03/13/2017 1402   BUN 11 03/13/2017 1402   CREATININE 0.83 03/13/2017 1402   CALCIUM 9.5 03/13/2017 1402   PROT 6.0 (L) 12/12/2016 0837   ALBUMIN 4.1 12/12/2016 0837   AST 24 12/12/2016 0837   ALT 33 (H) 12/12/2016 0837   ALKPHOS 71 12/12/2016 0837   BILITOT 0.5 12/12/2016 0837   GFRNONAA 88 (L) 11/12/2013 1305   GFRAA >90 11/12/2013 1305    Diabetic Labs (most recent): Lab Results  Component Value Date   HGBA1C 7.0 (H) 03/13/2017  HGBA1C 8.2 (H) 12/12/2016   HGBA1C 7.3 (H) 08/11/2016    Assessment & Plan:   1. Uncontrolled type 2 diabetes mellitus with complication, with long-term current use of insulin ([place])  - She remains at a high risk for more acute and chronic complications of diabetes which include CAD, CVA, CKD, retinopathy, and neuropathy. These are all discussed in detail with the patient.  Patient came with target fasting glucose profile, and stable  recent A1c of 7% improving from 8.2% .   Glucose logs and insulin administration records pertaining to this visit,  to be scanned into patient's records.  Recent labs reviewed.   - I have re-counseled the patient on diet management and weight loss  by adopting a carbohydrate restricted / protein rich  Diet.  -  Suggestion is made for her to avoid simple carbohydrates  from her diet including Cakes, Sweet Desserts / Pastries, Ice Cream, Soda (diet and  regular), Sweet Tea, Candies, Chips, Cookies, Store Bought Juices, Alcohol in Excess of  1-2 drinks a day, Artificial Sweeteners, and "Sugar-free" Products. This will help patient to have stable blood glucose profile and potentially avoid unintended weight gain.   - Patient is advised to stick to a routine mealtimes to eat 3 meals  a day and avoid unnecessary snacks ( to snack only to correct hypoglycemia).  - I have approached patient with the following individualized plan to manage diabetes and patient agrees.  - She stayed on Levemir 20 units instead of 30 units. - I discussed and switched her Levemir to [place] 20 units daily at bedtime, associated with strict monitoring of blood glucose 2 times a day- daily before breakfast and at bedtime.  - Patient is warned not to take insulin without proper monitoring per orders.  -Patient is encouraged to call clinic for blood glucose levels less than 70 or above 200 mg /dl. - I will continue metformin 1000 mg extended release once a day after breakfast, therapeutically suitable for patient. - She has tolerated  Trulicity, I will continue 1.5 mg weekly. Side effects and precautions discussed with her.   - Patient specific target  for A1c; LDL, HDL, Triglycerides, and  Waist Circumference were discussed in detail.  2) BP/HTN: . Uncontrolled. I advised her to continue current medications including ACEI/ARB. 3) Lipids/HPL:  continue statins. 4)  Weight/Diet: CDE consult in progress, exercise, and carbohydrates information provided.  5) Chronic Care/Health Maintenance:  -Patient is on ACEI/ARB and Statin medications and encouraged to continue to follow up with Ophthalmology, Podiatrist at least yearly or according to recommendations, and advised to  stay away from smoking. I have recommended yearly flu vaccine and pneumonia vaccination at least every 5 years; moderate intensity exercise for up to 150 minutes weekly; and  sleep for at least 7 hours a  day.  - Time spent with the patient: 25 min, of which >50% was spent in reviewing her sugar logs , discussing her hypo- and hyper-glycemic episodes, reviewing her current and  previous labs and insulin doses and developing a plan to avoid hypo- and hyper-glycemia.   - I advised patient to maintain close follow up with Sinda Du, MD for primary care needs.   Follow up plan: -Return in about 3 months (around 06/22/2017) for follow up with pre-visit labs, meter, and logs.  Glade Lloyd, MD Phone: [phone]  Fax: [phone]  -  This note was partially dictated with voice recognition software. Similar sounding words can be transcribed inadequately or may not  be corrected upon review.  03/22/2017, 12:04 PM

## 2017-03-22 NOTE — Patient Instructions (Signed)

## 2017-05-26 ENCOUNTER — Other Ambulatory Visit: Payer: Self-pay | Admitting: "Endocrinology

## 2017-06-21 LAB — COMPLETE METABOLIC PANEL WITH GFR
AG RATIO: 2 (calc) (ref 1.0–2.5)
ALBUMIN MSPROF: 4.1 g/dL (ref 3.6–5.1)
ALKALINE PHOSPHATASE (APISO): 67 U/L (ref 33–130)
ALT: 28 U/L (ref 6–29)
AST: 21 U/L (ref 10–35)
BILIRUBIN TOTAL: 0.6 mg/dL (ref 0.2–1.2)
BUN: 10 mg/dL (ref 7–25)
CHLORIDE: 107 mmol/L (ref 98–110)
CO2: 29 mmol/L (ref 20–32)
Calcium: 9.6 mg/dL (ref 8.6–10.4)
Creat: 0.69 mg/dL (ref 0.60–0.93)
GFR, EST AFRICAN AMERICAN: 100 mL/min/{1.73_m2} (ref >=60)
GFR, Est Non African American: 86 mL/min/{1.73_m2} (ref >=60)
Globulin: 2.1 g/dL (calc) (ref 1.9–3.7)
Glucose, Bld: 119 mg/dL (ref 65–139)
POTASSIUM: 4.4 mmol/L (ref 3.5–5.3)
SODIUM: 143 mmol/L (ref 135–146)
TOTAL PROTEIN: 6.2 g/dL (ref 6.1–8.1)

## 2017-06-21 LAB — HEMOGLOBIN A1C
Hgb A1c MFr Bld: 7.4 % of total Hgb — ABNORMAL HIGH (ref <5.7)
Mean Plasma Glucose: 166 (calc)
eAG (mmol/L): 9.2 (calc)

## 2017-06-27 ENCOUNTER — Encounter: Payer: Self-pay | Admitting: "Endocrinology

## 2017-06-27 ENCOUNTER — Ambulatory Visit: Payer: Medicare Other | Admitting: "Endocrinology

## 2017-06-27 VITALS — BP 122/73 | HR 81 | Ht 67.0 in | Wt 227.0 lb

## 2017-06-27 DIAGNOSIS — E782 Mixed hyperlipidemia: Secondary | ICD-10-CM | POA: Diagnosis not present

## 2017-06-27 DIAGNOSIS — I10 Essential (primary) hypertension: Secondary | ICD-10-CM

## 2017-06-27 DIAGNOSIS — Z6835 Body mass index (BMI) 35.0-35.9, adult: Secondary | ICD-10-CM | POA: Diagnosis not present

## 2017-06-27 DIAGNOSIS — E1165 Type 2 diabetes mellitus with hyperglycemia: Secondary | ICD-10-CM

## 2017-06-27 DIAGNOSIS — Z794 Long term (current) use of insulin: Secondary | ICD-10-CM | POA: Diagnosis not present

## 2017-06-27 DIAGNOSIS — IMO0002 Reserved for concepts with insufficient information to code with codable children: Secondary | ICD-10-CM

## 2017-06-27 DIAGNOSIS — E118 Type 2 diabetes mellitus with unspecified complications: Secondary | ICD-10-CM | POA: Diagnosis not present

## 2017-06-27 MED ORDER — INSULIN DEGLUDEC 100 UNIT/ML ~~LOC~~ SOPN: 30.0000 [IU] | PEN_INJECTOR | Freq: Every day | SUBCUTANEOUS | 2 refills | Status: DC

## 2017-06-27 MED ORDER — ONETOUCH VERIO W/DEVICE KIT: 1.0000 | PACK | 0 refills | Status: DC | PRN

## 2017-06-27 MED ORDER — GLUCOSE BLOOD VI STRP: ORAL_STRIP | 2 refills | Status: DC

## 2017-06-27 NOTE — Progress Notes (Signed)
Subjective:    Patient ID: Amanda Hopkins, female    DOB: 1945/09/03, PCP Sinda Du, MD   Past Medical History:  Diagnosis Date  . Arthritis   . Diabetes mellitus without complication ([place])   . Hyperlipidemia   . Hypertension   . Obesity   . Palpitations   . PONV (postoperative nausea and vomiting)   . Reflux esophagitis    Past Surgical History:  Procedure Laterality Date  . ABDOMINAL HYSTERECTOMY    . APPENDECTOMY    . CATARACT EXTRACTION Left   . CATARACT EXTRACTION W/PHACO Right 11/18/2013   Procedure: CATARACT EXTRACTION PHACO AND INTRAOCULAR LENS PLACEMENT (IOC);  Surgeon: Tonny Branch, MD;  Location: [facility];  Service: Ophthalmology;  Laterality: Right;  CDE:  7.76  . CESAREAN SECTION     x2  . CHEST WALL BIOPSY Right    removal of fatty tumor  . COLONOSCOPY  11/08/2004   [id number] rectosigmoid polyp/left side diverticula  . COLONOSCOPY  12/2009   RMR: left-sided diverticula, long tortuous colon  . COLONOSCOPY  2003   RMR: carcinoma in situ  . COLONOSCOPY N/A 04/07/2015   Procedure: COLONOSCOPY;  Surgeon: Daneil Dolin, MD;  Location: [facility];  Service: Endoscopy;  Laterality: N/A;  1100 - moved to 11/8 @ 11:30 - office to notify   Social History   Socioeconomic History  . Marital status: Married    Spouse name: None  . Number of children: 2  . Years of education: None  . Highest education level: None  Social Needs  . Financial resource strain: None  . Food insecurity - worry: None  . Food insecurity - inability: None  . Transportation needs - medical: None  . Transportation needs - non-medical: None  Occupational History  . None  Tobacco Use  . Smoking status: Never Smoker  . Smokeless tobacco: Never Used  Substance and Sexual Activity  . Alcohol use: No  . Drug use: No  . Sexual activity: Yes    Birth control/protection: Surgical  Other Topics Concern  . None  Social History Narrative  . None   Outpatient Encounter  Medications as of 06/27/2017  Medication Sig  . Blood Glucose Monitoring Suppl (ONETOUCH VERIO) w/Device KIT 1 each by Does not apply route as needed.  . Dulaglutide (TRULICITY) 1.5 DP/8.2UM SOPN INJECT 1.5 MG INTO THE SKIN ONCE A WEEK  . glucose blood (ONETOUCH VERIO) test strip Use as instructed  . ibuprofen (ADVIL,MOTRIN) 200 MG tablet Take 200 mg by mouth every 6 (six) hours as needed.  . insulin degludec (TRESIBA FLEXTOUCH) 100 UNIT/ML SOPN FlexTouch Pen Inject 0.3 mLs (30 Units total) into the skin daily at 10 pm.  . loratadine (CLARITIN) 10 MG tablet Take 10 mg by mouth daily as needed for allergies.  Marland Kitchen losartan (COZAAR) 25 MG tablet Take 25 mg by mouth at bedtime.   . metFORMIN (GLUCOPHAGE) 500 MG tablet TAKE TWO TABLETS BY MOUTH DAILY WITH BREAKFAST  . rosuvastatin (CRESTOR) 10 MG tablet Take 10 mg by mouth at bedtime.   . [DISCONTINUED] glucose blood (ONE TOUCH ULTRA TEST) test strip Use as instructed  . [DISCONTINUED] insulin degludec (TRESIBA FLEXTOUCH) 100 UNIT/ML SOPN FlexTouch Pen Inject 0.2 mLs (20 Units total) into the skin daily at 10 pm.   No facility-administered encounter medications on file as of 06/27/2017.    ALLERGIES: Allergies  Allergen Reactions  . Erythromycin    VACCINATION STATUS:  There is no immunization history on file  for this patient.  Diabetes  She presents for her follow-up diabetic visit. She has type 2 diabetes mellitus. Onset time: She was diagnosed at approximate age of 15 years. Her disease course has been worsening. There are no hypoglycemic associated symptoms. Pertinent negatives for hypoglycemia include no confusion, headaches, pallor or seizures. There are no diabetic associated symptoms. Pertinent negatives for diabetes include no chest pain, no polydipsia, no polyphagia and no polyuria. There are no hypoglycemic complications. Symptoms are worsening. There are no diabetic complications. Risk factors for coronary artery disease include diabetes  mellitus, dyslipidemia, hypertension, obesity and sedentary lifestyle. She is compliant with treatment most of the time. Her weight is stable. She is following a diabetic diet. When asked about meal planning, she reported none. She has had a previous visit with a dietitian. She rarely participates in exercise. Her breakfast blood glucose range is generally 140-180 mg/dl. Her overall blood glucose range is 140-180 mg/dl. An ACE inhibitor/angiotensin II receptor blocker is being taken.  Hyperlipidemia  This is a chronic problem. The current episode started more than 1 year ago. The problem is controlled. Exacerbating diseases include diabetes and obesity. Pertinent negatives include no chest pain, myalgias or shortness of breath. Current antihyperlipidemic treatment includes statins. Risk factors for coronary artery disease include dyslipidemia, diabetes mellitus, hypertension, obesity, a sedentary lifestyle and post-menopausal.  Hypertension  This is a chronic problem. The current episode started more than 1 year ago. Pertinent negatives include no chest pain, headaches, palpitations or shortness of breath. Risk factors for coronary artery disease include dyslipidemia and diabetes mellitus.    Review of Systems  Constitutional: Negative for chills, fever and unexpected weight change.  HENT: Negative for trouble swallowing and voice change.   Eyes: Negative for visual disturbance.  Respiratory: Negative for cough, shortness of breath and wheezing.   Cardiovascular: Negative for chest pain, palpitations and leg swelling.  Gastrointestinal: Negative for diarrhea, nausea and vomiting.  Endocrine: Negative for cold intolerance, heat intolerance, polydipsia, polyphagia and polyuria.  Musculoskeletal: Negative for arthralgias and myalgias.  Skin: Negative for color change, pallor, rash and wound.  Neurological: Negative for seizures and headaches.  Psychiatric/Behavioral: Negative for confusion and  suicidal ideas.    Objective:    BP 122/73   Pulse 81   Ht 5' 7"  (1.702 m)   Wt 227 lb (103 kg)   BMI 35.55 kg/m   Wt Readings from Last 3 Encounters:  06/27/17 227 lb (103 kg)  03/22/17 227 lb (103 kg)  12/19/16 228 lb (103.4 kg)    Physical Exam  Constitutional: She is oriented to person, place, and time. She appears well-developed.  HENT:  Head: Normocephalic and atraumatic.  Eyes: EOM are normal.  Neck: Normal range of motion. Neck supple. No tracheal deviation present. No thyromegaly present.  Cardiovascular: Normal rate and regular rhythm.  Pulmonary/Chest: Effort normal and breath sounds normal.  Abdominal: Soft. Bowel sounds are normal. There is no tenderness. There is no guarding.  Musculoskeletal: Normal range of motion. She exhibits no edema.  Neurological: She is alert and oriented to person, place, and time. She has normal reflexes. No cranial nerve deficit. Coordination normal.  Skin: Skin is warm and dry. No rash noted. No erythema. No pallor.  Psychiatric: She has a normal mood and affect. Judgment normal.    CMP     Component Value Date/Time   NA 143 06/20/2017 1154   K 4.4 06/20/2017 1154   CL 107 06/20/2017 1154   CO2 29 06/20/2017  1154   GLUCOSE 119 06/20/2017 1154   BUN 10 06/20/2017 1154   CREATININE 0.69 06/20/2017 1154   CALCIUM 9.6 06/20/2017 1154   PROT 6.2 06/20/2017 1154   ALBUMIN 4.1 12/12/2016 0837   AST 21 06/20/2017 1154   ALT 28 06/20/2017 1154   ALKPHOS 71 12/12/2016 0837   BILITOT 0.6 06/20/2017 1154   GFRNONAA 86 06/20/2017 1154   GFRAA 100 06/20/2017 1154    Diabetic Labs (most recent): Lab Results  Component Value Date   HGBA1C 7.4 (H) 06/20/2017   HGBA1C 7.0 (H) 03/13/2017   HGBA1C 8.2 (H) 12/12/2016   Lipid Panel     Component Value Date/Time   CHOL 126 05/12/2016 0917   TRIG 109 05/12/2016 0917   HDL 46 (L) 05/12/2016 0917   CHOLHDL 2.7 05/12/2016 0917   VLDL 22 05/12/2016 0917   LDLCALC 58 05/12/2016 0917     Assessment & Plan:   1. Uncontrolled type 2 diabetes mellitus with complication, with long-term current use of insulin ([place])  - She remains at a high risk for more acute and chronic complications of diabetes which include CAD, CVA, CKD, retinopathy, and neuropathy. These are all discussed in detail with the patient.  Patient came with target fasting glucose profile, and stable  recent A1c of 7.4% increasing from 7%.   Glucose logs and insulin administration records pertaining to this visit,  to be scanned into patient's records.  Recent labs reviewed.   - I have re-counseled the patient on diet management and weight loss  by adopting a carbohydrate restricted / protein rich  Diet.  -  Suggestion is made for her to avoid simple carbohydrates  from her diet including Cakes, Sweet Desserts / Pastries, Ice Cream, Soda (diet and regular), Sweet Tea, Candies, Chips, Cookies, Store Bought Juices, Alcohol in Excess of  1-2 drinks a day, Artificial Sweeteners, and "Sugar-free" Products. This will help patient to have stable blood glucose profile and potentially avoid unintended weight gain.  - Patient is advised to stick to a routine mealtimes to eat 3 meals  a day and avoid unnecessary snacks ( to snack only to correct hypoglycemia).  - I have approached patient with the following individualized plan to manage diabetes and patient agrees.  - I discussed and increased  Tresiba to 30 units daily at bedtime, associated with strict monitoring of blood glucose 2 times a day- daily before breakfast and at bedtime.  - Patient is warned not to take insulin without proper monitoring per orders.  -Patient is encouraged to call clinic for blood glucose levels less than 70 or above 200 mg /dl. - I will continue metformin 1000 mg extended release once a day after breakfast, therapeutically suitable for patient. - She has tolerated  Trulicity, I will continue 1.5 mg weekly. Side effects and precautions  discussed with her.   - Patient specific target  for A1c; LDL, HDL, Triglycerides, and  Waist Circumference were discussed in detail.  2) BP/HTN: . controlled. I advised her to continue current medications including ACEI/ARB. 3) Lipids/HPL: [place], LDL 58, I advised her to  continue statins. 4)  Weight/Diet: CDE consult in progress, exercise, and carbohydrates information provided.  5) Chronic Care/Health Maintenance:  -Patient is on ACEI/ARB and Statin medications and encouraged to continue to follow up with Ophthalmology, Podiatrist at least yearly or according to recommendations, and advised to  stay away from smoking. I have recommended yearly flu vaccine and pneumonia vaccination at least every 5 years; moderate  intensity exercise for up to 150 minutes weekly; and  sleep for at least 7 hours a day.  - Time spent with the patient: 25 min, of which >50% was spent in reviewing her blood glucose logs , discussing her hypo- and hyper-glycemic episodes, reviewing her current and  previous labs and insulin doses and developing a plan to avoid hypo- and hyper-glycemia. Please refer to Patient Instructions for Blood Glucose Monitoring and Insulin/Medications Dosing Guide"  in media tab for additional information.  - I advised patient to maintain close follow up with Sinda Du, MD for primary care needs.   Follow up plan: -Return in about 3 months (around 09/25/2017) for meter, and logs, follow up with pre-visit labs, meter, and logs.  Glade Lloyd, MD Phone: [phone]  Fax: [phone]  -  This note was partially dictated with voice recognition software. Similar sounding words can be transcribed inadequately or may not  be corrected upon review.  06/27/2017, 1:06 PM

## 2017-06-27 NOTE — Patient Instructions (Signed)

## 2017-07-05 ENCOUNTER — Other Ambulatory Visit: Payer: Self-pay

## 2017-07-05 MED ORDER — GLUCOSE BLOOD VI STRP: ORAL_STRIP | 5 refills | Status: DC

## 2017-09-19 LAB — COMPLETE METABOLIC PANEL WITH GFR
AG RATIO: 2.1 (calc) (ref 1.0–2.5)
ALBUMIN MSPROF: 4.1 g/dL (ref 3.6–5.1)
ALT: 29 U/L (ref 6–29)
AST: 22 U/L (ref 10–35)
Alkaline phosphatase (APISO): 75 U/L (ref 33–130)
BILIRUBIN TOTAL: 0.5 mg/dL (ref 0.2–1.2)
BUN: 14 mg/dL (ref 7–25)
CHLORIDE: 105 mmol/L (ref 98–110)
CO2: 31 mmol/L (ref 20–32)
Calcium: 9.9 mg/dL (ref 8.6–10.4)
Creat: 0.9 mg/dL (ref 0.60–0.93)
GFR, EST AFRICAN AMERICAN: 74 mL/min/{1.73_m2} (ref >=60)
GFR, Est Non African American: 63 mL/min/{1.73_m2} (ref >=60)
Globulin: 2 g/dL (calc) (ref 1.9–3.7)
Glucose, Bld: 161 mg/dL — ABNORMAL HIGH (ref 65–139)
POTASSIUM: 3.9 mmol/L (ref 3.5–5.3)
Sodium: 143 mmol/L (ref 135–146)
TOTAL PROTEIN: 6.1 g/dL (ref 6.1–8.1)

## 2017-09-19 LAB — HEMOGLOBIN A1C
HEMOGLOBIN A1C: 7.5 %{Hb} — AB (ref <5.7)
MEAN PLASMA GLUCOSE: 169 (calc)
eAG (mmol/L): 9.3 (calc)

## 2017-09-26 ENCOUNTER — Ambulatory Visit (INDEPENDENT_AMBULATORY_CARE_PROVIDER_SITE_OTHER): Payer: Medicare Other | Admitting: "Endocrinology

## 2017-09-26 ENCOUNTER — Encounter: Payer: Self-pay | Admitting: "Endocrinology

## 2017-09-26 VITALS — BP 134/76 | HR 90 | Ht 67.0 in | Wt 228.0 lb

## 2017-09-26 DIAGNOSIS — Z794 Long term (current) use of insulin: Secondary | ICD-10-CM | POA: Diagnosis not present

## 2017-09-26 DIAGNOSIS — E782 Mixed hyperlipidemia: Secondary | ICD-10-CM | POA: Diagnosis not present

## 2017-09-26 DIAGNOSIS — E118 Type 2 diabetes mellitus with unspecified complications: Secondary | ICD-10-CM

## 2017-09-26 DIAGNOSIS — IMO0002 Reserved for concepts with insufficient information to code with codable children: Secondary | ICD-10-CM

## 2017-09-26 DIAGNOSIS — I10 Essential (primary) hypertension: Secondary | ICD-10-CM

## 2017-09-26 DIAGNOSIS — E1165 Type 2 diabetes mellitus with hyperglycemia: Secondary | ICD-10-CM | POA: Diagnosis not present

## 2017-09-26 NOTE — Patient Instructions (Signed)

## 2017-09-26 NOTE — Progress Notes (Signed)
Subjective:    Patient ID: Amanda Hopkins, female    DOB: 1945-10-14, PCP Sinda Du, MD   Past Medical History:  Diagnosis Date  . Arthritis   . Diabetes mellitus without complication ([place])   . Hyperlipidemia   . Hypertension   . Obesity   . Palpitations   . PONV (postoperative nausea and vomiting)   . Reflux esophagitis    Past Surgical History:  Procedure Laterality Date  . ABDOMINAL HYSTERECTOMY    . APPENDECTOMY    . CATARACT EXTRACTION Left   . CATARACT EXTRACTION W/PHACO Right 11/18/2013   Procedure: CATARACT EXTRACTION PHACO AND INTRAOCULAR LENS PLACEMENT (IOC);  Surgeon: Tonny Branch, MD;  Location: [facility];  Service: Ophthalmology;  Laterality: Right;  CDE:  7.76  . CESAREAN SECTION     x2  . CHEST WALL BIOPSY Right    removal of fatty tumor  . COLONOSCOPY  11/08/2004   [id number] rectosigmoid polyp/left side diverticula  . COLONOSCOPY  12/2009   RMR: left-sided diverticula, long tortuous colon  . COLONOSCOPY  2003   RMR: carcinoma in situ  . COLONOSCOPY N/A 04/07/2015   Procedure: COLONOSCOPY;  Surgeon: Daneil Dolin, MD;  Location: [facility];  Service: Endoscopy;  Laterality: N/A;  1100 - moved to 11/8 @ 11:30 - office to notify   Social History   Socioeconomic History  . Marital status: Married    Spouse name: Not on file  . Number of children: 2  . Years of education: Not on file  . Highest education level: Not on file  Occupational History  . Not on file  Social Needs  . Financial resource strain: Not on file  . Food insecurity:    Worry: Not on file    Inability: Not on file  . Transportation needs:    Medical: Not on file    Non-medical: Not on file  Tobacco Use  . Smoking status: Never Smoker  . Smokeless tobacco: Never Used  Substance and Sexual Activity  . Alcohol use: No  . Drug use: No  . Sexual activity: Yes    Birth control/protection: Surgical  Lifestyle  . Physical activity:    Days per week: Not on file     Minutes per session: Not on file  . Stress: Not on file  Relationships  . Social connections:    Talks on phone: Not on file    Gets together: Not on file    Attends religious service: Not on file    Active member of club or organization: Not on file    Attends meetings of clubs or organizations: Not on file    Relationship status: Not on file  Other Topics Concern  . Not on file  Social History Narrative  . Not on file   Outpatient Encounter Medications as of 09/26/2017  Medication Sig  . Blood Glucose Monitoring Suppl (ONETOUCH VERIO) w/Device KIT 1 each by Does not apply route as needed.  . Dulaglutide (TRULICITY) 1.5 RD/4.0CX SOPN INJECT 1.5 MG INTO THE SKIN ONCE A WEEK  . glucose blood (ONETOUCH VERIO) test strip Use as instructed 2 x daily. E11.65  . ibuprofen (ADVIL,MOTRIN) 200 MG tablet Take 200 mg by mouth every 6 (six) hours as needed.  . insulin degludec (TRESIBA FLEXTOUCH) 100 UNIT/ML SOPN FlexTouch Pen Inject 0.3 mLs (30 Units total) into the skin daily at 10 pm.  . loratadine (CLARITIN) 10 MG tablet Take 10 mg by mouth daily as needed for  allergies.  Marland Kitchen losartan (COZAAR) 25 MG tablet Take 25 mg by mouth at bedtime.   . rosuvastatin (CRESTOR) 10 MG tablet Take 10 mg by mouth at bedtime.   . [DISCONTINUED] metFORMIN (GLUCOPHAGE) 500 MG tablet TAKE TWO TABLETS BY MOUTH DAILY WITH BREAKFAST   No facility-administered encounter medications on file as of 09/26/2017.    ALLERGIES: Allergies  Allergen Reactions  . Erythromycin    VACCINATION STATUS:  There is no immunization history on file for this patient.  Diabetes  She presents for her follow-up diabetic visit. She has type 2 diabetes mellitus. Onset time: She was diagnosed at approximate age of 54 years. Her disease course has been stable. There are no hypoglycemic associated symptoms. Pertinent negatives for hypoglycemia include no confusion, headaches, pallor or seizures. There are no diabetic associated symptoms.  Pertinent negatives for diabetes include no chest pain, no polydipsia, no polyphagia and no polyuria. There are no hypoglycemic complications. Symptoms are stable. There are no diabetic complications. Risk factors for coronary artery disease include diabetes mellitus, dyslipidemia, hypertension, obesity and sedentary lifestyle. She is compliant with treatment most of the time. Her weight is stable. She is following a diabetic diet. When asked about meal planning, she reported none. She has had a previous visit with a dietitian. She rarely participates in exercise. Her breakfast blood glucose range is generally 140-180 mg/dl. Her overall blood glucose range is 140-180 mg/dl. An ACE inhibitor/angiotensin II receptor blocker is being taken.  Hyperlipidemia  This is a chronic problem. The current episode started more than 1 year ago. The problem is controlled. Exacerbating diseases include diabetes and obesity. Pertinent negatives include no chest pain, myalgias or shortness of breath. Current antihyperlipidemic treatment includes statins. Risk factors for coronary artery disease include dyslipidemia, diabetes mellitus, hypertension, obesity, a sedentary lifestyle and post-menopausal.  Hypertension  This is a chronic problem. The current episode started more than 1 year ago. The problem is controlled. Pertinent negatives include no chest pain, headaches, palpitations or shortness of breath. Risk factors for coronary artery disease include dyslipidemia and diabetes mellitus.    Review of Systems  Constitutional: Negative for chills, fever and unexpected weight change.  HENT: Negative for trouble swallowing and voice change.   Eyes: Negative for visual disturbance.  Respiratory: Negative for cough, shortness of breath and wheezing.   Cardiovascular: Negative for chest pain, palpitations and leg swelling.  Gastrointestinal: Positive for abdominal pain and nausea. Negative for diarrhea and vomiting.   Endocrine: Negative for cold intolerance, heat intolerance, polydipsia, polyphagia and polyuria.  Musculoskeletal: Negative for arthralgias and myalgias.  Skin: Negative for color change, pallor, rash and wound.  Neurological: Negative for seizures and headaches.  Psychiatric/Behavioral: Negative for confusion and suicidal ideas.    Objective:    BP 134/76   Pulse 90   Ht 5' 7"  (1.702 m)   Wt 228 lb (103.4 kg)   BMI 35.71 kg/m   Wt Readings from Last 3 Encounters:  09/26/17 228 lb (103.4 kg)  06/27/17 227 lb (103 kg)  03/22/17 227 lb (103 kg)    Physical Exam  Constitutional: She is oriented to person, place, and time. She appears well-developed.  HENT:  Head: Normocephalic and atraumatic.  Eyes: EOM are normal.  Neck: Normal range of motion. Neck supple. No tracheal deviation present. No thyromegaly present.  Cardiovascular: Normal rate.  Pulmonary/Chest: Effort normal.  Abdominal: There is no tenderness. There is no guarding.  Musculoskeletal: Normal range of motion. She exhibits no edema.  Neurological:  She is alert and oriented to person, place, and time. She has normal reflexes. No cranial nerve deficit. Coordination normal.  Skin: Skin is warm and dry. No rash noted. No erythema. No pallor.  Psychiatric: She has a normal mood and affect. Judgment normal.    CMP     Component Value Date/Time   NA 143 09/18/2017 1441   K 3.9 09/18/2017 1441   CL 105 09/18/2017 1441   CO2 31 09/18/2017 1441   GLUCOSE 161 (H) 09/18/2017 1441   BUN 14 09/18/2017 1441   CREATININE 0.90 09/18/2017 1441   CALCIUM 9.9 09/18/2017 1441   PROT 6.1 09/18/2017 1441   ALBUMIN 4.1 12/12/2016 0837   AST 22 09/18/2017 1441   ALT 29 09/18/2017 1441   ALKPHOS 71 12/12/2016 0837   BILITOT 0.5 09/18/2017 1441   GFRNONAA 63 09/18/2017 1441   GFRAA 74 09/18/2017 1441    Diabetic Labs (most recent): Lab Results  Component Value Date   HGBA1C 7.5 (H) 09/18/2017   HGBA1C 7.4 (H) 06/20/2017    HGBA1C 7.0 (H) 03/13/2017   Lipid Panel     Component Value Date/Time   CHOL 126 05/12/2016 0917   TRIG 109 05/12/2016 0917   HDL 46 (L) 05/12/2016 0917   CHOLHDL 2.7 05/12/2016 0917   VLDL 22 05/12/2016 0917   LDLCALC 58 05/12/2016 0917    Assessment & Plan:   1. Uncontrolled type 2 diabetes mellitus with complication, with long-term current use of insulin ([place])  - She remains at a high risk for more acute and chronic complications of diabetes which include CAD, CVA, CKD, retinopathy, and neuropathy. These are all discussed in detail with the patient.  Patient came with target fasting glucose profile, and stable  recent A1c of 7.5 %.   Glucose logs and insulin administration records pertaining to this visit,  to be scanned into patient's records.  Recent labs reviewed.   - I have re-counseled the patient on diet management and weight loss  by adopting a carbohydrate restricted / protein rich  Diet.  -  Suggestion is made for her to avoid simple carbohydrates  from her diet including Cakes, Sweet Desserts / Pastries, Ice Cream, Soda (diet and regular), Sweet Tea, Candies, Chips, Cookies, Store Bought Juices, Alcohol in Excess of  1-2 drinks a day, Artificial Sweeteners, and "Sugar-free" Products. This will help patient to have stable blood glucose profile and potentially avoid unintended weight gain.  - Patient is advised to stick to a routine mealtimes to eat 3 meals  a day and avoid unnecessary snacks ( to snack only to correct hypoglycemia).  - I have approached patient with the following individualized plan to manage diabetes and patient agrees.  -She kept changing the dose of her basal insulin from 20-30 units nightly.  -I advised her to keep her basal insulin consistent 30 units nightly,  associated with strict monitoring of blood glucose 2 times a day- daily before breakfast and at bedtime. -Patient is encouraged to call clinic for blood glucose levels less than 70 or above  200 mg /dl. -Due to her vague abdominal pain associated with nausea, I advised her to discontinue metformin to observe .  - She has tolerated  Trulicity, I will continue 1.5 mg weekly. Side effects and precautions discussed with her.   - Patient specific target  for A1c; LDL, HDL, Triglycerides, and  Waist Circumference were discussed in detail.  2) BP/HTN: .  Her blood pressure is controlled to target. I  advised her to continue current medications including ACEI/ARB. 3) Lipids/HPL: [place], LDL 58, I advised her to continue her Crestor 10 mg p.o. Nightly.  4)  Weight/Diet: CDE consult in progress, exercise, and carbohydrates information provided.  5) Chronic Care/Health Maintenance:  -Patient is on ACEI/ARB and Statin medications and encouraged to continue to follow up with Ophthalmology, Podiatrist at least yearly or according to recommendations, and advised to  stay away from smoking. I have recommended yearly flu vaccine and pneumonia vaccination at least every 5 years; moderate intensity exercise for up to 150 minutes weekly; and  sleep for at least 7 hours a day.  - I advised patient to maintain close follow up with Sinda Du, MD for primary care needs.  - Time spent with the patient: 25 min, of which >50% was spent in reviewing her blood glucose logs , discussing her hypo- and hyper-glycemic episodes, reviewing her current and  previous labs and insulin doses and developing a plan to avoid hypo- and hyper-glycemia. Please refer to Patient Instructions for Blood Glucose Monitoring and Insulin/Medications Dosing Guide"  in media tab for additional information. Amanda Hopkins participated in the discussions, expressed understanding, and voiced agreement with the above plans.  All questions were answered to her satisfaction. she is encouraged to contact clinic should she have any questions or concerns prior to her return visit.  Follow up plan: -Return in about 3 months (around  12/26/2017) for meter, and logs.  Glade Lloyd, MD Phone: [phone]  Fax: [phone]  -  This note was partially dictated with voice recognition software. Similar sounding words can be transcribed inadequately or may not  be corrected upon review.  09/26/2017, 5:15 PM

## 2017-11-22 ENCOUNTER — Other Ambulatory Visit (HOSPITAL_COMMUNITY): Payer: Self-pay | Admitting: Pulmonary Disease

## 2017-11-22 ENCOUNTER — Ambulatory Visit (HOSPITAL_COMMUNITY)
Admission: RE | Admit: 2017-11-22 | Discharge: 2017-11-22 | Disposition: A | Discharge status: Home / Self Care | Payer: Medicare Other | Source: Ambulatory Visit | Attending: Pulmonary Disease | Admitting: Pulmonary Disease

## 2017-11-22 DIAGNOSIS — M25522 Pain in left elbow: Secondary | ICD-10-CM | POA: Insufficient documentation

## 2017-11-24 ENCOUNTER — Other Ambulatory Visit (HOSPITAL_COMMUNITY): Payer: Self-pay | Admitting: Pulmonary Disease

## 2017-11-24 DIAGNOSIS — Z780 Asymptomatic menopausal state: Secondary | ICD-10-CM

## 2017-12-06 ENCOUNTER — Ambulatory Visit (HOSPITAL_COMMUNITY)
Admission: RE | Admit: 2017-12-06 | Discharge: 2017-12-06 | Disposition: A | Discharge status: Home / Self Care | Payer: Medicare Other | Source: Ambulatory Visit | Attending: Pulmonary Disease | Admitting: Pulmonary Disease

## 2017-12-06 DIAGNOSIS — Z780 Asymptomatic menopausal state: Secondary | ICD-10-CM | POA: Insufficient documentation

## 2017-12-06 LAB — HM DEXA SCAN

## 2017-12-18 ENCOUNTER — Other Ambulatory Visit: Payer: Self-pay | Admitting: "Endocrinology

## 2018-01-16 LAB — BASIC METABOLIC PANEL
BUN: 15 (ref 4–21)
CREATININE: 0.7 (ref 0.5–1.1)

## 2018-01-16 LAB — LIPID PANEL
Cholesterol: 132 (ref 0–200)
HDL: 38 (ref 35–70)
LDL Cholesterol: 73
TRIGLYCERIDES: 133 (ref 40–160)

## 2018-01-16 LAB — HEMOGLOBIN A1C: Hgb A1c MFr Bld: 7.3 — AB (ref 4.0–6.0)

## 2018-01-16 LAB — TSH: TSH: 0.49 (ref 0.41–5.90)

## 2018-01-25 ENCOUNTER — Encounter: Payer: Self-pay | Admitting: "Endocrinology

## 2018-01-25 ENCOUNTER — Ambulatory Visit (INDEPENDENT_AMBULATORY_CARE_PROVIDER_SITE_OTHER): Payer: Medicare Other | Admitting: "Endocrinology

## 2018-01-25 VITALS — BP 120/85 | HR 74 | Ht 67.0 in | Wt 229.0 lb

## 2018-01-25 DIAGNOSIS — E118 Type 2 diabetes mellitus with unspecified complications: Secondary | ICD-10-CM | POA: Diagnosis not present

## 2018-01-25 DIAGNOSIS — E782 Mixed hyperlipidemia: Secondary | ICD-10-CM | POA: Diagnosis not present

## 2018-01-25 DIAGNOSIS — Z794 Long term (current) use of insulin: Secondary | ICD-10-CM

## 2018-01-25 DIAGNOSIS — I10 Essential (primary) hypertension: Secondary | ICD-10-CM

## 2018-01-25 DIAGNOSIS — Z6835 Body mass index (BMI) 35.0-35.9, adult: Secondary | ICD-10-CM

## 2018-01-25 DIAGNOSIS — IMO0002 Reserved for concepts with insufficient information to code with codable children: Secondary | ICD-10-CM

## 2018-01-25 DIAGNOSIS — E1165 Type 2 diabetes mellitus with hyperglycemia: Secondary | ICD-10-CM

## 2018-01-25 MED ORDER — INSULIN DEGLUDEC 100 UNIT/ML ~~LOC~~ SOPN: 34.0000 [IU] | PEN_INJECTOR | Freq: Every day | SUBCUTANEOUS | 2 refills | Status: DC

## 2018-01-25 NOTE — Progress Notes (Signed)
Endocrinology follow-up note  Subjective:    Patient ID: Amanda Hopkins, female    DOB: 1945-10-02, PCP Sinda Du, MD   Past Medical History:  Diagnosis Date  . Arthritis   . Diabetes mellitus without complication ([place])   . Hyperlipidemia   . Hypertension   . Obesity   . Palpitations   . PONV (postoperative nausea and vomiting)   . Reflux esophagitis    Past Surgical History:  Procedure Laterality Date  . ABDOMINAL HYSTERECTOMY    . APPENDECTOMY    . CATARACT EXTRACTION Left   . CATARACT EXTRACTION W/PHACO Right 11/18/2013   Procedure: CATARACT EXTRACTION PHACO AND INTRAOCULAR LENS PLACEMENT (IOC);  Surgeon: Tonny Branch, MD;  Location: [facility];  Service: Ophthalmology;  Laterality: Right;  CDE:  7.76  . CESAREAN SECTION     x2  . CHEST WALL BIOPSY Right    removal of fatty tumor  . COLONOSCOPY  11/08/2004   [id number] rectosigmoid polyp/left side diverticula  . COLONOSCOPY  12/2009   RMR: left-sided diverticula, long tortuous colon  . COLONOSCOPY  2003   RMR: carcinoma in situ  . COLONOSCOPY N/A 04/07/2015   Procedure: COLONOSCOPY;  Surgeon: Daneil Dolin, MD;  Location: [facility];  Service: Endoscopy;  Laterality: N/A;  1100 - moved to 11/8 @ 11:30 - office to notify   Social History   Socioeconomic History  . Marital status: Married    Spouse name: Not on file  . Number of children: 2  . Years of education: Not on file  . Highest education level: Not on file  Occupational History  . Not on file  Social Needs  . Financial resource strain: Not on file  . Food insecurity:    Worry: Not on file    Inability: Not on file  . Transportation needs:    Medical: Not on file    Non-medical: Not on file  Tobacco Use  . Smoking status: Never Smoker  . Smokeless tobacco: Never Used  Substance and Sexual Activity  . Alcohol use: No  . Drug use: No  . Sexual activity: Yes    Birth control/protection: Surgical  Lifestyle  . Physical activity:    Days per  week: Not on file    Minutes per session: Not on file  . Stress: Not on file  Relationships  . Social connections:    Talks on phone: Not on file    Gets together: Not on file    Attends religious service: Not on file    Active member of club or organization: Not on file    Attends meetings of clubs or organizations: Not on file    Relationship status: Not on file  Other Topics Concern  . Not on file  Social History Narrative  . Not on file   Outpatient Encounter Medications as of 01/25/2018  Medication Sig  . Blood Glucose Monitoring Suppl (ONETOUCH VERIO) w/Device KIT 1 each by Does not apply route as needed.  Marland Kitchen glucose blood (ONETOUCH VERIO) test strip Use as instructed 2 x daily. E11.65  . ibuprofen (ADVIL,MOTRIN) 200 MG tablet Take 200 mg by mouth every 6 (six) hours as needed.  . insulin degludec (TRESIBA FLEXTOUCH) 100 UNIT/ML SOPN FlexTouch Pen Inject 0.34 mLs (34 Units total) into the skin at bedtime.  Marland Kitchen loratadine (CLARITIN) 10 MG tablet Take 10 mg by mouth daily as needed for allergies.  Marland Kitchen losartan (COZAAR) 25 MG tablet Take 25 mg by mouth at bedtime.   Marland Kitchen  rosuvastatin (CRESTOR) 10 MG tablet Take 10 mg by mouth at bedtime.   . TRULICITY 1.5 FX/5.8IT SOPN INJECT 1.5 MG INTO SKIN ONCE A WEEK  . [DISCONTINUED] insulin degludec (TRESIBA FLEXTOUCH) 100 UNIT/ML SOPN FlexTouch Pen Inject 0.3 mLs (30 Units total) into the skin daily at 10 pm.   No facility-administered encounter medications on file as of 01/25/2018.    ALLERGIES: Allergies  Allergen Reactions  . Erythromycin    VACCINATION STATUS:  There is no immunization history on file for this patient.  Diabetes  She presents for her follow-up diabetic visit. She has type 2 diabetes mellitus. Onset time: She was diagnosed at approximate age of 23 years. Her disease course has been stable. There are no hypoglycemic associated symptoms. Pertinent negatives for hypoglycemia include no confusion, headaches, pallor or seizures.  There are no diabetic associated symptoms. Pertinent negatives for diabetes include no chest pain, no polydipsia, no polyphagia and no polyuria. There are no hypoglycemic complications. Symptoms are stable. There are no diabetic complications. Risk factors for coronary artery disease include diabetes mellitus, dyslipidemia, hypertension, obesity and sedentary lifestyle. She is compliant with treatment most of the time. Her weight is stable. She is following a diabetic diet. When asked about meal planning, she reported none. She has had a previous visit with a dietitian. She rarely participates in exercise. Her breakfast blood glucose range is generally 140-180 mg/dl. Her overall blood glucose range is 140-180 mg/dl. An ACE inhibitor/angiotensin II receptor blocker is being taken.  Hyperlipidemia  This is a chronic problem. The current episode started more than 1 year ago. The problem is controlled. Exacerbating diseases include diabetes and obesity. Pertinent negatives include no chest pain, myalgias or shortness of breath. Current antihyperlipidemic treatment includes statins. Risk factors for coronary artery disease include dyslipidemia, diabetes mellitus, hypertension, obesity, a sedentary lifestyle and post-menopausal.  Hypertension  This is a chronic problem. The current episode started more than 1 year ago. The problem is controlled. Pertinent negatives include no chest pain, headaches, palpitations or shortness of breath. Risk factors for coronary artery disease include dyslipidemia and diabetes mellitus.    Review of Systems  Constitutional: Negative for chills, fever and unexpected weight change.  HENT: Negative for trouble swallowing and voice change.   Eyes: Negative for visual disturbance.  Respiratory: Negative for cough, shortness of breath and wheezing.   Cardiovascular: Negative for chest pain, palpitations and leg swelling.  Gastrointestinal: Positive for abdominal pain and nausea.  Negative for diarrhea and vomiting.  Endocrine: Negative for cold intolerance, heat intolerance, polydipsia, polyphagia and polyuria.  Musculoskeletal: Negative for arthralgias and myalgias.  Skin: Negative for color change, pallor, rash and wound.  Neurological: Negative for seizures and headaches.  Psychiatric/Behavioral: Negative for confusion and suicidal ideas.    Objective:    BP 120/85   Pulse 74   Ht 5' 7"  (1.702 m)   Wt 229 lb (103.9 kg)   BMI 35.87 kg/m   Wt Readings from Last 3 Encounters:  01/25/18 229 lb (103.9 kg)  09/26/17 228 lb (103.4 kg)  06/27/17 227 lb (103 kg)    Physical Exam  Constitutional: She is oriented to person, place, and time. She appears well-developed.  HENT:  Head: Normocephalic and atraumatic.  Eyes: EOM are normal.  Neck: Normal range of motion. Neck supple. No tracheal deviation present. No thyromegaly present.  Cardiovascular: Normal rate.  Pulmonary/Chest: Effort normal.  Abdominal: There is no tenderness. There is no guarding.  Musculoskeletal: Normal range of motion. She  exhibits no edema.  Neurological: She is alert and oriented to person, place, and time. She has normal reflexes. No cranial nerve deficit. Coordination normal.  Skin: Skin is warm and dry. No rash noted. No erythema. No pallor.  Psychiatric: She has a normal mood and affect. Judgment normal.    CMP     Component Value Date/Time   NA 143 09/18/2017 1441   K 3.9 09/18/2017 1441   CL 105 09/18/2017 1441   CO2 31 09/18/2017 1441   GLUCOSE 161 (H) 09/18/2017 1441   BUN 15 01/16/2018   CREATININE 0.7 01/16/2018   CREATININE 0.90 09/18/2017 1441   CALCIUM 9.9 09/18/2017 1441   PROT 6.1 09/18/2017 1441   ALBUMIN 4.1 12/12/2016 0837   AST 22 09/18/2017 1441   ALT 29 09/18/2017 1441   ALKPHOS 71 12/12/2016 0837   BILITOT 0.5 09/18/2017 1441   GFRNONAA 63 09/18/2017 1441   GFRAA 74 09/18/2017 1441    Diabetic Labs (most recent): Lab Results  Component Value  Date   HGBA1C 7.3 (A) 01/16/2018   HGBA1C 7.5 (H) 09/18/2017   HGBA1C 7.4 (H) 06/20/2017   Lipid Panel     Component Value Date/Time   CHOL 132 01/16/2018   TRIG 133 01/16/2018   HDL 38 01/16/2018   CHOLHDL 2.7 05/12/2016 0917   VLDL 22 05/12/2016 0917   LDLCALC 73 01/16/2018    Assessment & Plan:   1. Uncontrolled type 2 diabetes mellitus with complication, with long-term current use of insulin ([place])  - She remains at a high risk for more acute and chronic complications of diabetes which include CAD, CVA, CKD, retinopathy, and neuropathy. These are all discussed in detail with the patient.  -She returns with slightly above target fasting blood glucose profile, A1c 7.3% unchanged from her last visit A1c was 7.5%.      Glucose logs and insulin administration records pertaining to this visit,  to be scanned into patient's records.  Recent labs reviewed.   - I have re-counseled the patient on diet management and weight loss  by adopting a carbohydrate restricted / protein rich  Diet.  -  Suggestion is made for her to avoid simple carbohydrates  from her diet including Cakes, Sweet Desserts / Pastries, Ice Cream, Soda (diet and regular), Sweet Tea, Candies, Chips, Cookies, Store Bought Juices, Alcohol in Excess of  1-2 drinks a day, Artificial Sweeteners, and "Sugar-free" Products. This will help patient to have stable blood glucose profile and potentially avoid unintended weight gain.  - Patient is advised to stick to a routine mealtimes to eat 3 meals  a day and avoid unnecessary snacks ( to snack only to correct hypoglycemia).  - I have approached patient with the following individualized plan to manage diabetes and patient agrees.   -I advised her to increase her Tresiba to 34  units nightly,  associated with strict monitoring of blood glucose 2 times a day- daily before breakfast and at bedtime. -Patient is encouraged to call clinic for blood glucose levels less than 70 or above  200 mg /dl. -Due to her vague abdominal pain associated with nausea, I advised her to discontinue metformin to observe .  - She has tolerated  Trulicity, I will continue 1.5 mg weekly. Side effects and precautions discussed with her.   - Patient specific target  for A1c; LDL, HDL, Triglycerides, and  Waist Circumference were discussed in detail.  2) BP/HTN:   Her blood pressure is controlled to target.  I  advised her to continue her current blood pressure medications including losartan 25 mg p.o. daily.   3) Lipids/HPL: Her recent lipid panel showed controlled LDL at 73.  She is advised to continue Crestor 10 mg p.o. daily at bedtime.     4)  Weight/Diet: CDE consult in progress, exercise, and carbohydrates information provided.  5) Chronic Care/Health Maintenance:  -Patient is on ACEI/ARB and Statin medications and encouraged to continue to follow up with Ophthalmology, Podiatrist at least yearly or according to recommendations, and advised to  stay away from smoking. I have recommended yearly flu vaccine and pneumonia vaccination at least every 5 years; moderate intensity exercise for up to 150 minutes weekly; and  sleep for at least 7 hours a day.  - I advised patient to maintain close follow up with Sinda Du, MD for primary care needs.  - Time spent with the patient: 25 min, of which >50% was spent in reviewing her blood glucose logs , discussing her hypo- and hyper-glycemic episodes, reviewing her current and  previous labs and insulin doses and developing a plan to avoid hypo- and hyper-glycemia. Please refer to Patient Instructions for Blood Glucose Monitoring and Insulin/Medications Dosing Guide"  in media tab for additional information. Ruthe Mannan participated in the discussions, expressed understanding, and voiced agreement with the above plans.  All questions were answered to her satisfaction. she is encouraged to contact clinic should she have any questions or concerns  prior to her return visit.  Follow up plan: -Return in about 4 months (around 05/27/2018) for Follow up with Pre-visit Labs, Meter, and Logs.  Glade Lloyd, MD Phone: [phone]  Fax: [phone]  -  This note was partially dictated with voice recognition software. Similar sounding words can be transcribed inadequately or may not  be corrected upon review.  01/25/2018, 4:47 PM

## 2018-01-25 NOTE — Patient Instructions (Signed)

## 2018-05-22 LAB — COMPLETE METABOLIC PANEL WITH GFR
AG Ratio: 2.1 (calc) (ref 1.0–2.5)
ALKALINE PHOSPHATASE (APISO): 77 U/L (ref 33–130)
ALT: 25 U/L (ref 6–29)
AST: 21 U/L (ref 10–35)
Albumin: 4.1 g/dL (ref 3.6–5.1)
BILIRUBIN TOTAL: 0.5 mg/dL (ref 0.2–1.2)
BUN: 12 mg/dL (ref 7–25)
CHLORIDE: 110 mmol/L (ref 98–110)
CO2: 30 mmol/L (ref 20–32)
CREATININE: 0.71 mg/dL (ref 0.60–0.93)
Calcium: 9.9 mg/dL (ref 8.6–10.4)
GFR, Est African American: 97 mL/min/{1.73_m2} (ref >=60)
GFR, Est Non African American: 84 mL/min/{1.73_m2} (ref >=60)
Globulin: 2 g/dL (calc) (ref 1.9–3.7)
Glucose, Bld: 144 mg/dL — ABNORMAL HIGH (ref 65–139)
Potassium: 4.3 mmol/L (ref 3.5–5.3)
SODIUM: 144 mmol/L (ref 135–146)
Total Protein: 6.1 g/dL (ref 6.1–8.1)

## 2018-05-22 LAB — HEMOGLOBIN A1C
Hgb A1c MFr Bld: 8.1 % of total Hgb — ABNORMAL HIGH (ref <5.7)
Mean Plasma Glucose: 186 (calc)
eAG (mmol/L): 10.3 (calc)

## 2018-05-28 ENCOUNTER — Ambulatory Visit: Payer: Medicare Other | Admitting: "Endocrinology

## 2018-05-28 ENCOUNTER — Encounter: Payer: Self-pay | Admitting: "Endocrinology

## 2018-05-28 VITALS — BP 124/77 | HR 91 | Ht 67.0 in | Wt 228.0 lb

## 2018-05-28 DIAGNOSIS — E1165 Type 2 diabetes mellitus with hyperglycemia: Secondary | ICD-10-CM

## 2018-05-28 DIAGNOSIS — E118 Type 2 diabetes mellitus with unspecified complications: Secondary | ICD-10-CM

## 2018-05-28 DIAGNOSIS — IMO0002 Reserved for concepts with insufficient information to code with codable children: Secondary | ICD-10-CM

## 2018-05-28 DIAGNOSIS — I10 Essential (primary) hypertension: Secondary | ICD-10-CM | POA: Diagnosis not present

## 2018-05-28 DIAGNOSIS — Z794 Long term (current) use of insulin: Secondary | ICD-10-CM

## 2018-05-28 DIAGNOSIS — E782 Mixed hyperlipidemia: Secondary | ICD-10-CM | POA: Diagnosis not present

## 2018-05-28 MED ORDER — INSULIN DEGLUDEC 100 UNIT/ML ~~LOC~~ SOPN: 40.0000 [IU] | PEN_INJECTOR | Freq: Every day | SUBCUTANEOUS | 2 refills | Status: DC

## 2018-05-28 NOTE — Progress Notes (Signed)
Endocrinology follow-up note  Subjective:    Patient ID: Amanda Hopkins, female    DOB: 09/10/1945, PCP Sinda Du, MD   Past Medical History:  Diagnosis Date  . Arthritis   . Diabetes mellitus without complication ([place])   . Hyperlipidemia   . Hypertension   . Obesity   . Palpitations   . PONV (postoperative nausea and vomiting)   . Reflux esophagitis    Past Surgical History:  Procedure Laterality Date  . ABDOMINAL HYSTERECTOMY    . APPENDECTOMY    . CATARACT EXTRACTION Left   . CATARACT EXTRACTION W/PHACO Right 11/18/2013   Procedure: CATARACT EXTRACTION PHACO AND INTRAOCULAR LENS PLACEMENT (IOC);  Surgeon: Tonny Branch, MD;  Location: [facility];  Service: Ophthalmology;  Laterality: Right;  CDE:  7.76  . CESAREAN SECTION     x2  . CHEST WALL BIOPSY Right    removal of fatty tumor  . COLONOSCOPY  11/08/2004   CMK:[id number] rectosigmoid polyp/left side diverticula  . COLONOSCOPY  12/2009   RMR: left-sided diverticula, long tortuous colon  . COLONOSCOPY  2003   RMR: carcinoma in situ  . COLONOSCOPY N/A 04/07/2015   Procedure: COLONOSCOPY;  Surgeon: Daneil Dolin, MD;  Location: [facility];  Service: Endoscopy;  Laterality: N/A;  1100 - moved to 11/8 @ 11:30 - office to notify   Social History   Socioeconomic History  . Marital status: Married    Spouse name: Not on file  . Number of children: 2  . Years of education: Not on file  . Highest education level: Not on file  Occupational History  . Not on file  Social Needs  . Financial resource strain: Not on file  . Food insecurity:    Worry: Not on file    Inability: Not on file  . Transportation needs:    Medical: Not on file    Non-medical: Not on file  Tobacco Use  . Smoking status: Never Smoker  . Smokeless tobacco: Never Used  Substance and Sexual Activity  . Alcohol use: No  . Drug use: No  . Sexual activity: Yes    Birth control/protection: Surgical  Lifestyle  . Physical activity:    Days per  week: Not on file    Minutes per session: Not on file  . Stress: Not on file  Relationships  . Social connections:    Talks on phone: Not on file    Gets together: Not on file    Attends religious service: Not on file    Active member of club or organization: Not on file    Attends meetings of clubs or organizations: Not on file    Relationship status: Not on file  Other Topics Concern  . Not on file  Social History Narrative  . Not on file   Outpatient Encounter Medications as of 05/28/2018  Medication Sig  . Calcium Carb-Cholecalciferol (CALCIUM 1000 + D PO) Take by mouth.  . Blood Glucose Monitoring Suppl (ONETOUCH VERIO) w/Device KIT 1 each by Does not apply route as needed.  Marland Kitchen glucose blood (ONETOUCH VERIO) test strip Use as instructed 2 x daily. E11.65  . ibuprofen (ADVIL,MOTRIN) 200 MG tablet Take 200 mg by mouth every 6 (six) hours as needed.  . insulin degludec (TRESIBA FLEXTOUCH) 100 UNIT/ML SOPN FlexTouch Pen Inject 0.4 mLs (40 Units total) into the skin at bedtime.  Marland Kitchen loratadine (CLARITIN) 10 MG tablet Take 10 mg by mouth daily as needed for allergies.  Marland Kitchen losartan (  COZAAR) 25 MG tablet Take 25 mg by mouth at bedtime.   . rosuvastatin (CRESTOR) 10 MG tablet Take 10 mg by mouth at bedtime.   . TRULICITY 1.5 SP/2.3RA SOPN INJECT 1.5 MG INTO SKIN ONCE A WEEK  . [DISCONTINUED] insulin degludec (TRESIBA FLEXTOUCH) 100 UNIT/ML SOPN FlexTouch Pen Inject 0.34 mLs (34 Units total) into the skin at bedtime.   No facility-administered encounter medications on file as of 05/28/2018.    ALLERGIES: Allergies  Allergen Reactions  . Erythromycin    VACCINATION STATUS:  There is no immunization history on file for this patient.  Diabetes  She presents for her follow-up diabetic visit. She has type 2 diabetes mellitus. Onset time: She was diagnosed at approximate age of 23 years. Her disease course has been stable. There are no hypoglycemic associated symptoms. Pertinent negatives  for hypoglycemia include no confusion, headaches, pallor or seizures. There are no diabetic associated symptoms. Pertinent negatives for diabetes include no chest pain, no polydipsia, no polyphagia and no polyuria. There are no hypoglycemic complications. Symptoms are stable. There are no diabetic complications. Risk factors for coronary artery disease include diabetes mellitus, dyslipidemia, hypertension, obesity and sedentary lifestyle. She is compliant with treatment most of the time. Her weight is stable. She is following a diabetic diet. When asked about meal planning, she reported none. She has had a previous visit with a dietitian. She rarely participates in exercise. Her breakfast blood glucose range is generally 140-180 mg/dl. Her overall blood glucose range is 140-180 mg/dl. An ACE inhibitor/angiotensin II receptor blocker is being taken.  Hyperlipidemia  This is a chronic problem. The current episode started more than 1 year ago. The problem is controlled. Exacerbating diseases include diabetes and obesity. Pertinent negatives include no chest pain, myalgias or shortness of breath. Current antihyperlipidemic treatment includes statins. Risk factors for coronary artery disease include dyslipidemia, diabetes mellitus, hypertension, obesity, a sedentary lifestyle and post-menopausal.  Hypertension  This is a chronic problem. The current episode started more than 1 year ago. The problem is controlled. Pertinent negatives include no chest pain, headaches, palpitations or shortness of breath. Risk factors for coronary artery disease include dyslipidemia and diabetes mellitus.    Review of Systems  Constitutional: Negative for chills, fever and unexpected weight change.  HENT: Negative for trouble swallowing and voice change.   Eyes: Negative for visual disturbance.  Respiratory: Negative for cough, shortness of breath and wheezing.   Cardiovascular: Negative for chest pain, palpitations and leg  swelling.  Gastrointestinal: Positive for abdominal pain and nausea. Negative for diarrhea and vomiting.  Endocrine: Negative for cold intolerance, heat intolerance, polydipsia, polyphagia and polyuria.  Musculoskeletal: Negative for arthralgias and myalgias.  Skin: Negative for color change, pallor, rash and wound.  Neurological: Negative for seizures and headaches.  Psychiatric/Behavioral: Negative for confusion and suicidal ideas.    Objective:    BP 124/77   Pulse 91   Ht 5' 7"  (1.702 m)   Wt 228 lb (103.4 kg)   BMI 35.71 kg/m   Wt Readings from Last 3 Encounters:  05/28/18 228 lb (103.4 kg)  01/25/18 229 lb (103.9 kg)  09/26/17 228 lb (103.4 kg)    Physical Exam  Constitutional: She is oriented to person, place, and time. She appears well-developed.  HENT:  Head: Normocephalic and atraumatic.  Eyes: EOM are normal.  Neck: Normal range of motion. Neck supple. No tracheal deviation present. No thyromegaly present.  Cardiovascular: Normal rate.  Pulmonary/Chest: Effort normal.  Abdominal: There is no  abdominal tenderness. There is no guarding.  Musculoskeletal: Normal range of motion.        General: No edema.  Neurological: She is alert and oriented to person, place, and time. She has normal reflexes. No cranial nerve deficit. Coordination normal.  Skin: Skin is warm and dry. No rash noted. No erythema. No pallor.  Psychiatric: She has a normal mood and affect. Judgment normal.    CMP     Component Value Date/Time   NA 144 05/21/2018 1047   K 4.3 05/21/2018 1047   CL 110 05/21/2018 1047   CO2 30 05/21/2018 1047   GLUCOSE 144 (H) 05/21/2018 1047   BUN 12 05/21/2018 1047   BUN 15 01/16/2018   CREATININE 0.71 05/21/2018 1047   CALCIUM 9.9 05/21/2018 1047   PROT 6.1 05/21/2018 1047   ALBUMIN 4.1 12/12/2016 0837   AST 21 05/21/2018 1047   ALT 25 05/21/2018 1047   ALKPHOS 71 12/12/2016 0837   BILITOT 0.5 05/21/2018 1047   GFRNONAA 84 05/21/2018 1047   GFRAA 97  05/21/2018 1047    Diabetic Labs (most recent): Lab Results  Component Value Date   HGBA1C 8.1 (H) 05/21/2018   HGBA1C 7.3 (A) 01/16/2018   HGBA1C 7.5 (H) 09/18/2017   Lipid Panel     Component Value Date/Time   CHOL 132 01/16/2018   TRIG 133 01/16/2018   HDL 38 01/16/2018   CHOLHDL 2.7 05/12/2016 0917   VLDL 22 05/12/2016 0917   LDLCALC 73 01/16/2018    Assessment & Plan:   1. Uncontrolled type 2 diabetes mellitus with complication, with long-term current use of insulin ([place])  - She remains at a high risk for more acute and chronic complications of diabetes which include CAD, CVA, CKD, retinopathy, and neuropathy. These are all discussed in detail with the patient.  -She returns with higher glycemic profile than target and A1c of 8.1%, increasing from 7.3%.     Glucose logs and insulin administration records pertaining to this visit,  to be scanned into patient's records.  Recent labs reviewed.   - I have re-counseled the patient on diet management and weight loss  by adopting a carbohydrate restricted / protein rich  Diet.  -  Suggestion is made for her to avoid simple carbohydrates  from her diet including Cakes, Sweet Desserts / Pastries, Ice Cream, Soda (diet and regular), Sweet Tea, Candies, Chips, Cookies, Store Bought Juices, Alcohol in Excess of  1-2 drinks a day, Artificial Sweeteners, and "Sugar-free" Products. This will help patient to have stable blood glucose profile and potentially avoid unintended weight gain.   - Patient is advised to stick to a routine mealtimes to eat 3 meals  a day and avoid unnecessary snacks ( to snack only to correct hypoglycemia).  - I have approached patient with the following individualized plan to manage diabetes and patient agrees.   -She did not tolerate metformin and stayed off of it.  She is advised to increase her Tresiba to 40 units nightly, associated with strict monitoring of blood glucose 2 times a day- daily before  breakfast and at bedtime. -Patient is encouraged to call clinic for blood glucose levels less than 70 or above 150 mg /dl.  - She has tolerated  Trulicity, I will continue 1.5 mg weekly. Side effects and precautions discussed with her.   - Patient specific target  for A1c; LDL, HDL, Triglycerides, and  Waist Circumference were discussed in detail.  2) BP/HTN: Her blood pressure is controlled  to target.  She is advised to continue her current blood pressure medications including losartan 25 mg p.o. daily.   3) Lipids/HPL: Her recent lipid panel showed controlled LDL at 73.  She is advised to continue Crestor 10 mg p.o. daily at bedtime.     4)  Weight/Diet: CDE consult in progress, exercise, and carbohydrates information provided.  5) Chronic Care/Health Maintenance:  -Patient is on ACEI/ARB and Statin medications and encouraged to continue to follow up with Ophthalmology, Podiatrist at least yearly or according to recommendations, and advised to  stay away from smoking. I have recommended yearly flu vaccine and pneumonia vaccination at least every 5 years; moderate intensity exercise for up to 150 minutes weekly; and  sleep for at least 7 hours a day.  - I advised patient to maintain close follow up with Sinda Du, MD for primary care needs.  - Time spent with the patient: 25 min, of which >50% was spent in reviewing her blood glucose logs , discussing her hypo- and hyper-glycemic episodes, reviewing her current and  previous labs and insulin doses and developing a plan to avoid hypo- and hyper-glycemia. Please refer to Patient Instructions for Blood Glucose Monitoring and Insulin/Medications Dosing Guide"  in media tab for additional information. Ruthe Mannan participated in the discussions, expressed understanding, and voiced agreement with the above plans.  All questions were answered to her satisfaction. she is encouraged to contact clinic should she have any questions or concerns  prior to her return visit.   Follow up plan: -Return in about 3 months (around 08/27/2018) for Follow up with Pre-visit Labs, Meter, and Logs.  Glade Lloyd, MD Phone: [phone]  Fax: [phone]  -  This note was partially dictated with voice recognition software. Similar sounding words can be transcribed inadequately or may not  be corrected upon review.  05/28/2018, 3:04 PM

## 2018-05-28 NOTE — Patient Instructions (Signed)

## 2018-07-03 LAB — HM MAMMOGRAPHY

## 2018-08-02 ENCOUNTER — Other Ambulatory Visit: Payer: Self-pay | Admitting: "Endocrinology

## 2018-08-27 ENCOUNTER — Ambulatory Visit: Payer: Medicare Other | Admitting: "Endocrinology

## 2018-09-26 LAB — COMPLETE METABOLIC PANEL WITH GFR
AG Ratio: 2.2 (calc) (ref 1.0–2.5)
ALBUMIN MSPROF: 4.1 g/dL (ref 3.6–5.1)
ALT: 27 U/L (ref 6–29)
AST: 22 U/L (ref 10–35)
Alkaline phosphatase (APISO): 69 U/L (ref 37–153)
BILIRUBIN TOTAL: 0.5 mg/dL (ref 0.2–1.2)
BUN: 11 mg/dL (ref 7–25)
CHLORIDE: 107 mmol/L (ref 98–110)
CO2: 29 mmol/L (ref 20–32)
Calcium: 9.9 mg/dL (ref 8.6–10.4)
Creat: 0.73 mg/dL (ref 0.60–0.93)
GFR, EST AFRICAN AMERICAN: 94 mL/min/{1.73_m2} (ref >=60)
GFR, Est Non African American: 81 mL/min/{1.73_m2} (ref >=60)
GLUCOSE: 138 mg/dL (ref 65–139)
Globulin: 1.9 g/dL (calc) (ref 1.9–3.7)
Potassium: 3.8 mmol/L (ref 3.5–5.3)
Sodium: 143 mmol/L (ref 135–146)
Total Protein: 6 g/dL — ABNORMAL LOW (ref 6.1–8.1)

## 2018-09-26 LAB — HEMOGLOBIN A1C
Hgb A1c MFr Bld: 6.5 % of total Hgb — ABNORMAL HIGH (ref <5.7)
Mean Plasma Glucose: 140 (calc)
eAG (mmol/L): 7.7 (calc)

## 2018-09-27 ENCOUNTER — Other Ambulatory Visit: Payer: Self-pay | Admitting: "Endocrinology

## 2018-10-02 ENCOUNTER — Ambulatory Visit (INDEPENDENT_AMBULATORY_CARE_PROVIDER_SITE_OTHER): Payer: Medicare Other | Admitting: "Endocrinology

## 2018-10-02 ENCOUNTER — Other Ambulatory Visit: Payer: Self-pay

## 2018-10-02 ENCOUNTER — Encounter: Payer: Self-pay | Admitting: "Endocrinology

## 2018-10-02 VITALS — BP 132/82 | HR 80 | Ht 67.0 in | Wt 230.0 lb

## 2018-10-02 DIAGNOSIS — E1165 Type 2 diabetes mellitus with hyperglycemia: Secondary | ICD-10-CM

## 2018-10-02 DIAGNOSIS — E782 Mixed hyperlipidemia: Secondary | ICD-10-CM

## 2018-10-02 DIAGNOSIS — I10 Essential (primary) hypertension: Secondary | ICD-10-CM

## 2018-10-02 DIAGNOSIS — Z794 Long term (current) use of insulin: Secondary | ICD-10-CM

## 2018-10-02 DIAGNOSIS — E118 Type 2 diabetes mellitus with unspecified complications: Secondary | ICD-10-CM | POA: Diagnosis not present

## 2018-10-02 DIAGNOSIS — IMO0002 Reserved for concepts with insufficient information to code with codable children: Secondary | ICD-10-CM

## 2018-10-02 NOTE — Patient Instructions (Signed)

## 2018-10-02 NOTE — Progress Notes (Signed)
Endocrinology follow-up note  Subjective:    Patient ID: Amanda Hopkins, female    DOB: 1945-09-16, PCP Sinda Du, MD   Past Medical History:  Diagnosis Date  . Arthritis   . Diabetes mellitus without complication ([place])   . Hyperlipidemia   . Hypertension   . Obesity   . Palpitations   . PONV (postoperative nausea and vomiting)   . Reflux esophagitis    Past Surgical History:  Procedure Laterality Date  . ABDOMINAL HYSTERECTOMY    . APPENDECTOMY    . CATARACT EXTRACTION Left   . CATARACT EXTRACTION W/PHACO Right 11/18/2013   Procedure: CATARACT EXTRACTION PHACO AND INTRAOCULAR LENS PLACEMENT (IOC);  Surgeon: Tonny Branch, MD;  Location: [facility];  Service: Ophthalmology;  Laterality: Right;  CDE:  7.76  . CESAREAN SECTION     x2  . CHEST WALL BIOPSY Right    removal of fatty tumor  . COLONOSCOPY  11/08/2004   CHE:NIDPOEUMPN rectosigmoid polyp/left side diverticula  . COLONOSCOPY  12/2009   RMR: left-sided diverticula, long tortuous colon  . COLONOSCOPY  2003   RMR: carcinoma in situ  . COLONOSCOPY N/A 04/07/2015   Procedure: COLONOSCOPY;  Surgeon: Daneil Dolin, MD;  Location: [facility];  Service: Endoscopy;  Laterality: N/A;  1100 - moved to 11/8 @ 11:30 - office to notify   Social History   Socioeconomic History  . Marital status: Married    Spouse name: Not on file  . Number of children: 2  . Years of education: Not on file  . Highest education level: Not on file  Occupational History  . Not on file  Social Needs  . Financial resource strain: Not on file  . Food insecurity:    Worry: Not on file    Inability: Not on file  . Transportation needs:    Medical: Not on file    Non-medical: Not on file  Tobacco Use  . Smoking status: Never Smoker  . Smokeless tobacco: Never Used  Substance and Sexual Activity  . Alcohol use: No  . Drug use: No  . Sexual activity: Yes    Birth control/protection: Surgical  Lifestyle  . Physical activity:    Days per  week: Not on file    Minutes per session: Not on file  . Stress: Not on file  Relationships  . Social connections:    Talks on phone: Not on file    Gets together: Not on file    Attends religious service: Not on file    Active member of club or organization: Not on file    Attends meetings of clubs or organizations: Not on file    Relationship status: Not on file  Other Topics Concern  . Not on file  Social History Narrative  . Not on file   Outpatient Encounter Medications as of 10/02/2018  Medication Sig  . Blood Glucose Monitoring Suppl (ONETOUCH VERIO) w/Device KIT 1 each by Does not apply route as needed.  . Calcium Carb-Cholecalciferol (CALCIUM 1000 + D PO) Take by mouth.  Marland Kitchen ibuprofen (ADVIL,MOTRIN) 200 MG tablet Take 200 mg by mouth every 6 (six) hours as needed.  . insulin degludec (TRESIBA FLEXTOUCH) 100 UNIT/ML SOPN FlexTouch Pen Inject 0.4 mLs (40 Units total) into the skin at bedtime.  Marland Kitchen loratadine (CLARITIN) 10 MG tablet Take 10 mg by mouth daily as needed for allergies.  Marland Kitchen losartan (COZAAR) 25 MG tablet Take 25 mg by mouth at bedtime.   Glory Rosebush  VERIO test strip USE 1 STRIP TO CHECK GLUCOSE TWICE DAILY  . rosuvastatin (CRESTOR) 10 MG tablet Take 10 mg by mouth at bedtime.   . TRULICITY 1.5 UG/8.9VQ SOPN INJECT 1.5 MG INTO SKIN ONCE A WEEK   No facility-administered encounter medications on file as of 10/02/2018.    ALLERGIES: Allergies  Allergen Reactions  . Erythromycin    VACCINATION STATUS:  There is no immunization history on file for this patient.  Diabetes  She presents for her follow-up diabetic visit. She has type 2 diabetes mellitus. Onset time: She was diagnosed at approximate age of 31 years. Her disease course has been improving. There are no hypoglycemic associated symptoms. Pertinent negatives for hypoglycemia include no confusion, headaches, pallor or seizures. There are no diabetic associated symptoms. Pertinent negatives for diabetes include no  chest pain, no polydipsia, no polyphagia and no polyuria. There are no hypoglycemic complications. Symptoms are improving. There are no diabetic complications. Risk factors for coronary artery disease include diabetes mellitus, dyslipidemia, hypertension, obesity and sedentary lifestyle. She is compliant with treatment most of the time. Her weight is decreasing steadily. She is following a diabetic diet. When asked about meal planning, she reported none. She has had a previous visit with a dietitian. She rarely participates in exercise. Her breakfast blood glucose range is generally 130-140 mg/dl. Her bedtime blood glucose range is generally 140-180 mg/dl. Her overall blood glucose range is 130-140 mg/dl. An ACE inhibitor/angiotensin II receptor blocker is being taken.  Hyperlipidemia  This is a chronic problem. The current episode started more than 1 year ago. The problem is controlled. Exacerbating diseases include diabetes and obesity. Pertinent negatives include no chest pain, myalgias or shortness of breath. Current antihyperlipidemic treatment includes statins. Risk factors for coronary artery disease include dyslipidemia, diabetes mellitus, hypertension, obesity, a sedentary lifestyle and post-menopausal.  Hypertension  This is a chronic problem. The current episode started more than 1 year ago. The problem is controlled. Pertinent negatives include no chest pain, headaches, palpitations or shortness of breath. Risk factors for coronary artery disease include dyslipidemia and diabetes mellitus.    Review of Systems  Constitutional: Negative for chills, fever and unexpected weight change.  HENT: Negative for trouble swallowing and voice change.   Eyes: Negative for visual disturbance.  Respiratory: Negative for cough, shortness of breath and wheezing.   Cardiovascular: Negative for chest pain, palpitations and leg swelling.  Gastrointestinal: Positive for abdominal pain and nausea. Negative for  diarrhea and vomiting.  Endocrine: Negative for cold intolerance, heat intolerance, polydipsia, polyphagia and polyuria.  Musculoskeletal: Negative for arthralgias and myalgias.  Skin: Negative for color change, pallor, rash and wound.  Neurological: Negative for seizures and headaches.  Psychiatric/Behavioral: Negative for confusion and suicidal ideas.    Objective:    BP 132/82   Pulse 80   Ht 5' 7"  (1.702 m)   Wt 230 lb (104.3 kg)   BMI 36.02 kg/m   Wt Readings from Last 3 Encounters:  10/02/18 230 lb (104.3 kg)  05/28/18 228 lb (103.4 kg)  01/25/18 229 lb (103.9 kg)    Physical Exam  Constitutional: She is oriented to person, place, and time. She appears well-developed.  HENT:  Head: Normocephalic and atraumatic.  Eyes: EOM are normal.  Neck: Normal range of motion. Neck supple. No tracheal deviation present. No thyromegaly present.  Cardiovascular: Normal rate.  Pulmonary/Chest: Effort normal.  Abdominal: There is no abdominal tenderness. There is no guarding.  Musculoskeletal: Normal range of motion.  General: No edema.  Neurological: She is alert and oriented to person, place, and time. She has normal reflexes. No cranial nerve deficit. Coordination normal.  Skin: Skin is warm and dry. No rash noted. No erythema. No pallor.  Psychiatric: She has a normal mood and affect. Judgment normal.    CMP     Component Value Date/Time   NA 143 09/25/2018 1010   K 3.8 09/25/2018 1010   CL 107 09/25/2018 1010   CO2 29 09/25/2018 1010   GLUCOSE 138 09/25/2018 1010   BUN 11 09/25/2018 1010   BUN 15 01/16/2018   CREATININE 0.73 09/25/2018 1010   CALCIUM 9.9 09/25/2018 1010   PROT 6.0 (L) 09/25/2018 1010   ALBUMIN 4.1 12/12/2016 0837   AST 22 09/25/2018 1010   ALT 27 09/25/2018 1010   ALKPHOS 71 12/12/2016 0837   BILITOT 0.5 09/25/2018 1010   GFRNONAA 81 09/25/2018 1010   GFRAA 94 09/25/2018 1010    Diabetic Labs (most recent): Lab Results  Component Value  Date   HGBA1C 6.5 (H) 09/25/2018   HGBA1C 8.1 (H) 05/21/2018   HGBA1C 7.3 (A) 01/16/2018   Lipid Panel     Component Value Date/Time   CHOL 132 01/16/2018   TRIG 133 01/16/2018   HDL 38 01/16/2018   CHOLHDL 2.7 05/12/2016 0917   VLDL 22 05/12/2016 0917   LDLCALC 73 01/16/2018    Assessment & Plan:   1. Uncontrolled type 2 diabetes mellitus with complication, with long-term current use of insulin ([place])  - She remains at a high risk for more acute and chronic complications of diabetes which include CAD, CVA, CKD, retinopathy, and neuropathy. These are all discussed in detail with the patient.  -She returns with improving glycemic profile and A1c of 6.5% improving from 8.1% during her last visit.      Glucose logs and insulin administration records pertaining to this visit,  to be scanned into patient's records.  Recent labs reviewed.   - I have re-counseled the patient on diet management and weight loss  by adopting a carbohydrate restricted / protein rich  Diet.  - Patient admits there is a room for improvement in her diet and drink choices. -  Suggestion is made for her to avoid simple carbohydrates  from her diet including Cakes, Sweet Desserts / Pastries, Ice Cream, Soda (diet and regular), Sweet Tea, Candies, Chips, Cookies, Store Bought Juices, Alcohol in Excess of  1-2 drinks a day, Artificial Sweeteners, and "Sugar-free" Products. This will help patient to have stable blood glucose profile and potentially avoid unintended weight gain.   - Patient is advised to stick to a routine mealtimes to eat 3 meals  a day and avoid unnecessary snacks ( to snack only to correct hypoglycemia).  - I have approached patient with the following individualized plan to manage diabetes and patient agrees.   -She has history of intolerance to metformin.  She is advised to continue Tresiba 40 units nightly, associated with strict monitoring of blood glucose 2 times a day- daily before breakfast  and at bedtime. -Patient is encouraged to call clinic for blood glucose levels less than 70 or above 150 mg /dl.  - She has tolerated  Trulicity, I will continue 1.5 mg weekly. Side effects and precautions discussed with her.   - Patient specific target  for A1c; LDL, HDL, Triglycerides, and  Waist Circumference were discussed in detail.  2) BP/HTN: Her blood pressure is controlled to target.  She is advised  to continue her current blood pressure medications including losartan 25 mg p.o. daily.    3) Lipids/HPL: Her recent lipid panel showed controlled LDL at 73.  She is advised to continue Crestor 10 mg p.o. daily at bedtime.     4)  Weight/Diet: CDE consult in progress, exercise, and carbohydrates information provided.  5) Chronic Care/Health Maintenance:  -Patient is on ACEI/ARB and Statin medications and encouraged to continue to follow up with Ophthalmology, Podiatrist at least yearly or according to recommendations, and advised to  stay away from smoking. I have recommended yearly flu vaccine and pneumonia vaccination at least every 5 years; moderate intensity exercise for up to 150 minutes weekly; and  sleep for at least 7 hours a day.  - I advised patient to maintain close follow up with Sinda Du, MD for primary care needs.  - Time spent with the patient: 25 min, of which >50% was spent in reviewing her blood glucose logs , discussing her hypoglycemia and hyperglycemia episodes, reviewing her current and  previous labs / studies and medications  doses and developing a plan to avoid hypoglycemia and hyperglycemia. Please refer to Patient Instructions for Blood Glucose Monitoring and Insulin/Medications Dosing Guide"  in media tab for additional information. Please  also refer to " Patient Self Inventory" in the Media  tab for reviewed elements of pertinent patient history.  Ruthe Mannan participated in the discussions, expressed understanding, and voiced agreement with the  above plans.  All questions were answered to her satisfaction. she is encouraged to contact clinic should she have any questions or concerns prior to her return visit.   Follow up plan: -Return in about 6 months (around 04/04/2019) for Follow up with Pre-visit Labs, Meter, and Logs.  Glade Lloyd, MD Phone: [phone]  Fax: [phone]  -  This note was partially dictated with voice recognition software. Similar sounding words can be transcribed inadequately or may not  be corrected upon review.  10/02/2018, 1:42 PM

## 2018-10-19 ENCOUNTER — Other Ambulatory Visit: Payer: Self-pay | Admitting: "Endocrinology

## 2018-12-21 LAB — HM DIABETES EYE EXAM

## 2019-01-14 ENCOUNTER — Other Ambulatory Visit: Payer: Self-pay | Admitting: "Endocrinology

## 2019-02-13 IMAGING — DX DG ELBOW COMPLETE 3+V*L*
4 series · 4 of 4 positions shown · non-contrast
Comparison: None.

CLINICAL DATA: Lateral posterior pain x 6 weeks. Knot of lateral
side of elbow.

EXAM:
LEFT ELBOW - COMPLETE 3+ VIEW

[elbow ap]
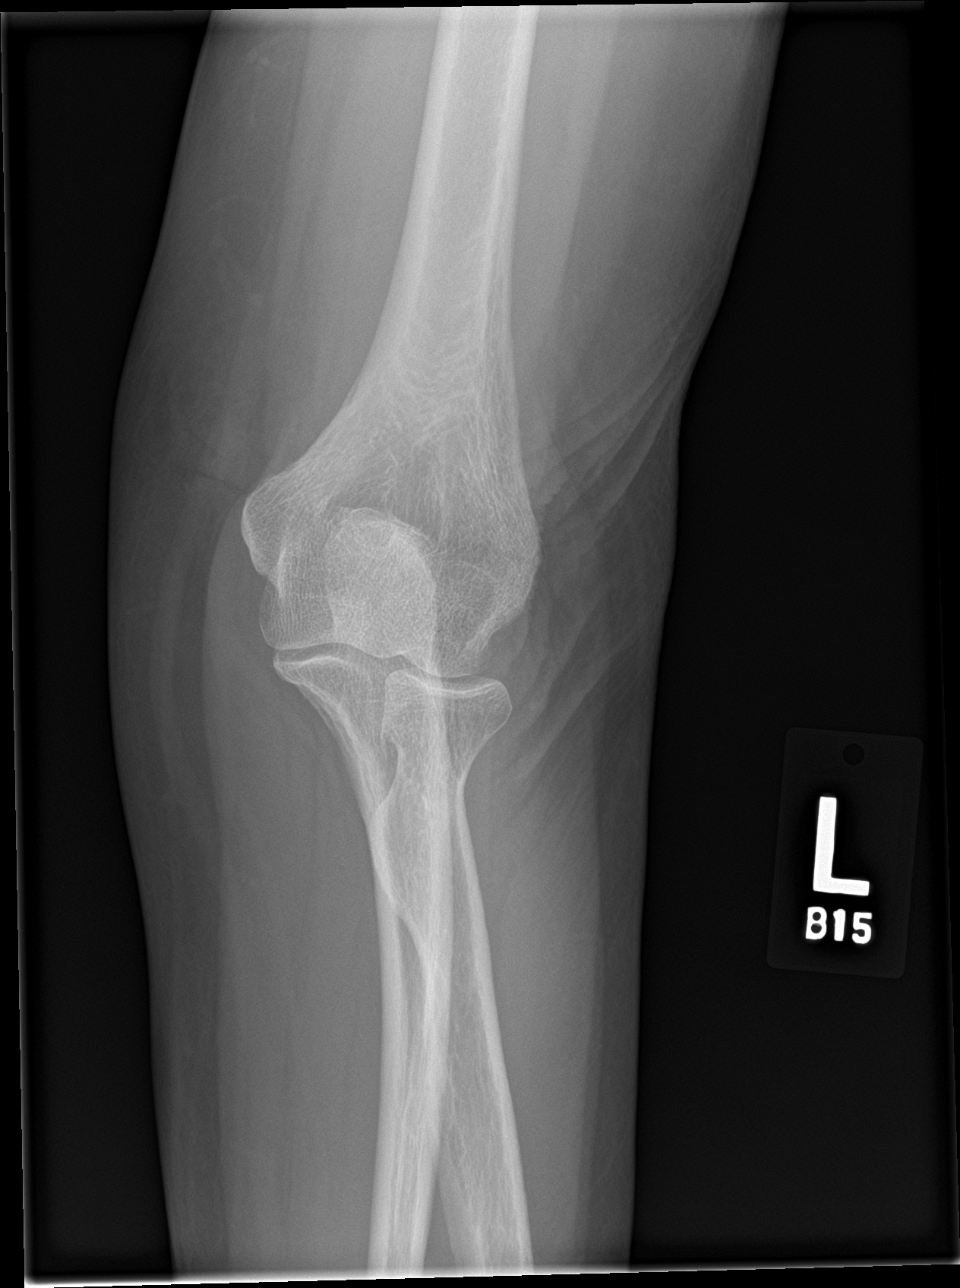

[elbow obl (1 of 2)]
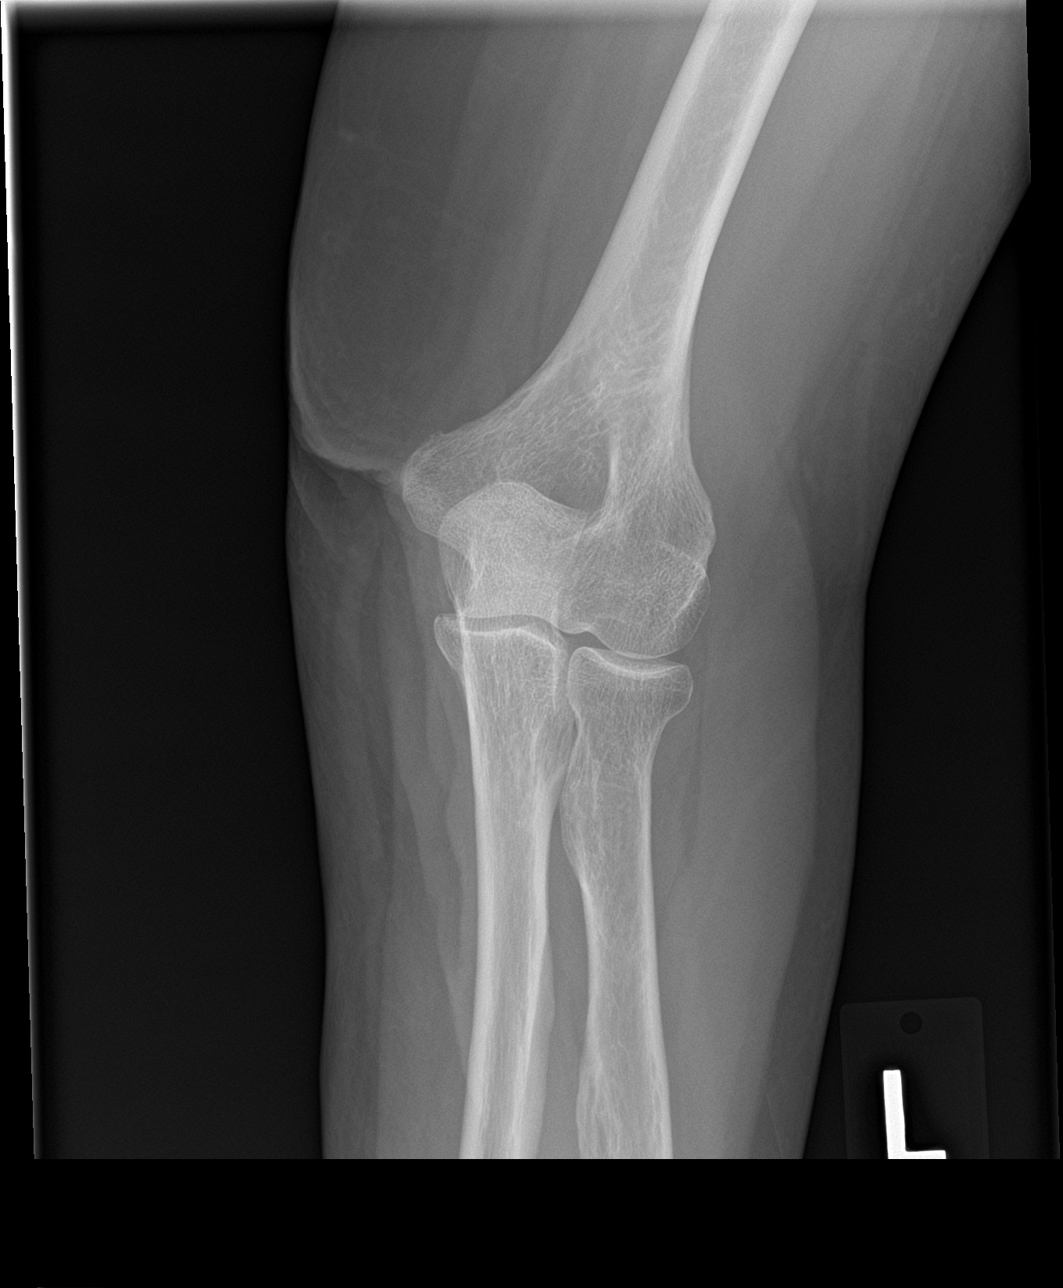

[elbow lat]
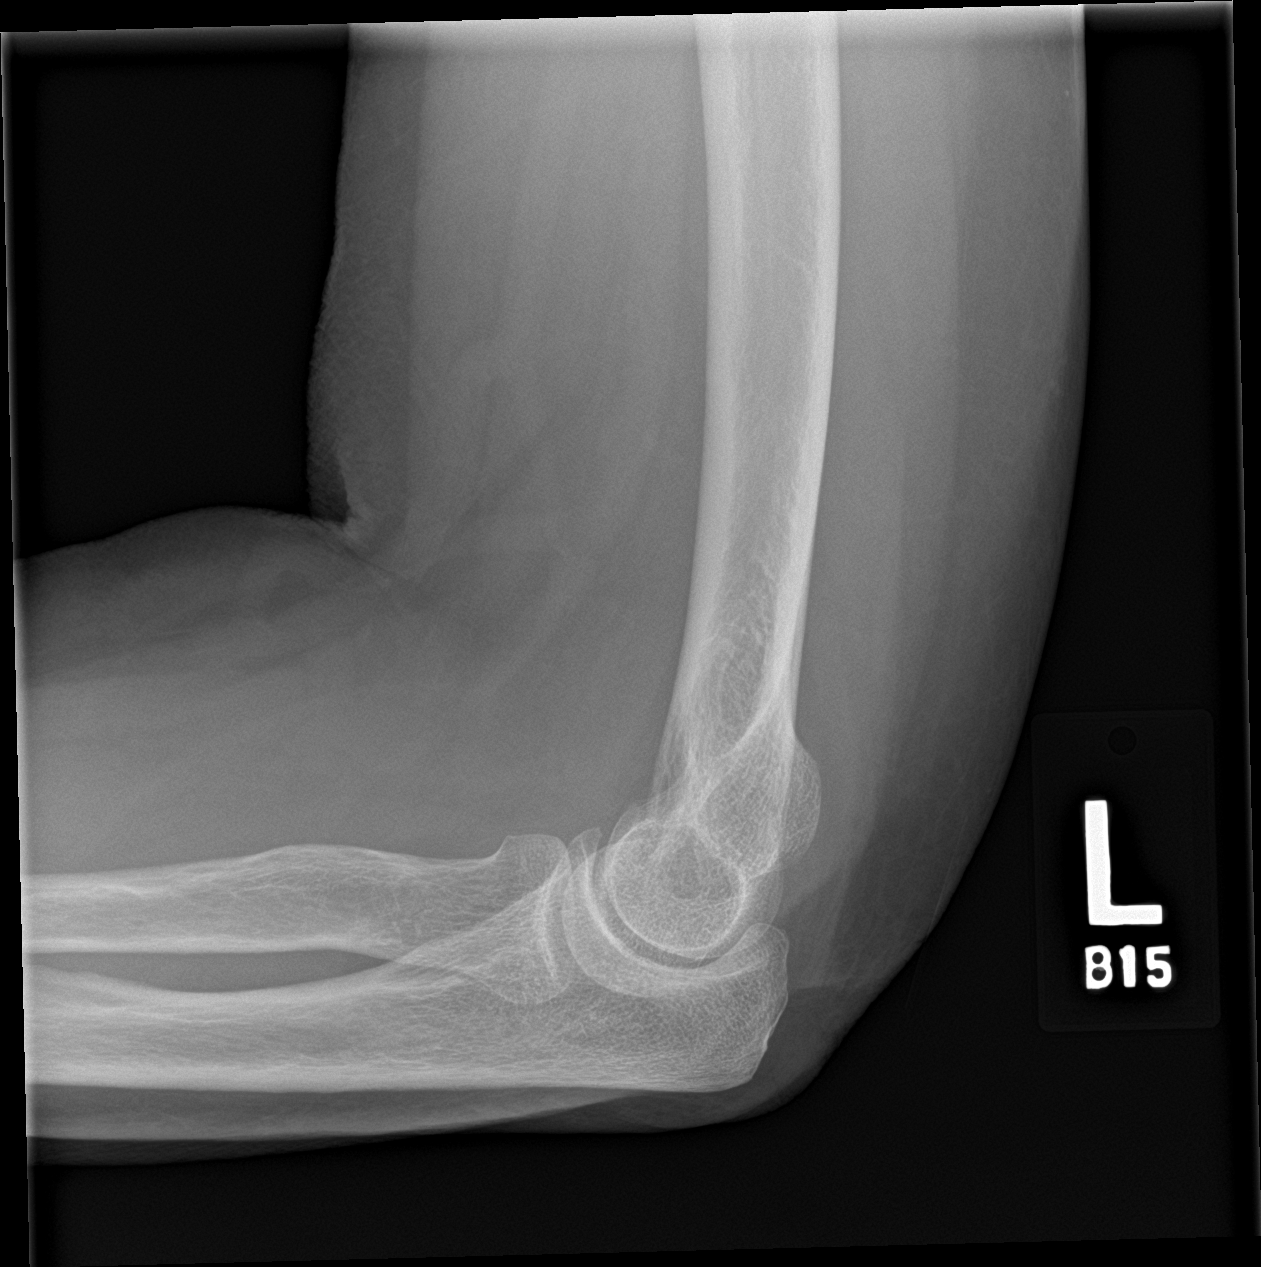

[elbow obl (2 of 2)]
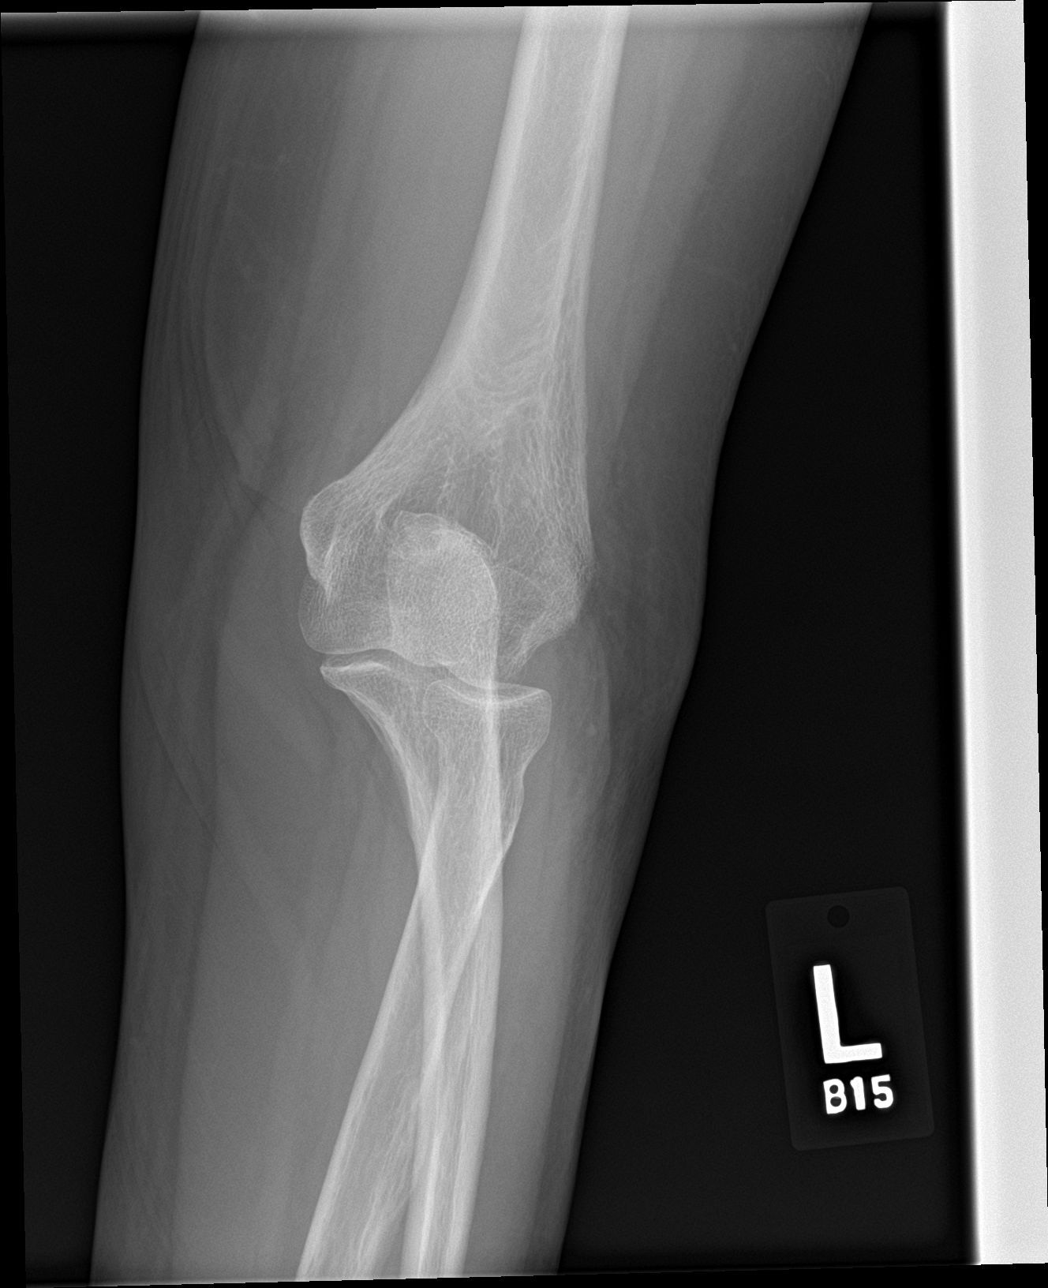

[4 of 4 positions shown; findings below may reference images not displayed]

FINDINGS: There is no evidence of fracture, dislocation, or joint effusion.
There is no evidence of arthropathy or other focal bone abnormality.
Soft tissues are unremarkable.
IMPRESSION: Negative.

## 2019-02-26 ENCOUNTER — Other Ambulatory Visit: Payer: Self-pay | Admitting: "Endocrinology

## 2019-03-10 NOTE — Progress Notes (Signed)
CARDIOLOGY CONSULT NOTE       Patient ID: Amanda Hopkins MRN: [id number] DOB/AGE: 1946/03/15 73 y.o.  Admit date: (Not on file) Referring Physician: Luan Pulling Primary Physician: Sinda Du, MD Primary Cardiologist: New Reason for Consultation: Chest Pain   Active Problems:   * No active hospital problems. *   HPI:  73 y.o. referred by Dr Luan Pulling for chest pain.  History of DM-2, HTN, HLD and reflux esophagitis A1c has recently improved to 6.5 had been over 8. LDL 73 on statin Mom passed a month ago and since then has had SSCP Non exertional No associated symptoms Activity limited by arthritis in multiple joints  She thinks stress has had something to do with symptoms. Mom died recently and husband had TAVR surgery with Dr Burt Knack this year as well as all the COVID issues.   She has two daughters living locally one a Pharmacist, hospital and one a Education officer, museum  She use to have Dalmations and would like to get another puppy   ROS All other systems reviewed and negative except as noted above  Past Medical History:  Diagnosis Date  . Arthritis   . Diabetes mellitus without complication ([place])   . Hyperlipidemia   . Hypertension   . Obesity   . Palpitations   . PONV (postoperative nausea and vomiting)   . Reflux esophagitis     Family History  Problem Relation Age of Onset  . Hypertension Mother   . Colon cancer Cousin        paternal   . Colon cancer Cousin        paternal  . Colon cancer Cousin        paternal  . Colon polyps Father   . Prostate cancer Father   . Breast cancer Maternal Aunt     Social History   Socioeconomic History  . Marital status: Married    Spouse name: Not on file  . Number of children: 2  . Years of education: Not on file  . Highest education level: Not on file  Occupational History  . Not on file  Social Needs  . Financial resource strain: Not on file  . Food insecurity    Worry: Not on file    Inability: Not on file  . Transportation  needs    Medical: Not on file    Non-medical: Not on file  Tobacco Use  . Smoking status: Never Smoker  . Smokeless tobacco: Never Used  Substance and Sexual Activity  . Alcohol use: No  . Drug use: No  . Sexual activity: Yes    Birth control/protection: Surgical  Lifestyle  . Physical activity    Days per week: Not on file    Minutes per session: Not on file  . Stress: Not on file  Relationships  . Social Herbalist on phone: Not on file    Gets together: Not on file    Attends religious service: Not on file    Active member of club or organization: Not on file    Attends meetings of clubs or organizations: Not on file    Relationship status: Not on file  . Intimate partner violence    Fear of current or ex partner: Not on file    Emotionally abused: Not on file    Physically abused: Not on file    Forced sexual activity: Not on file  Other Topics Concern  . Not on file  Social History Narrative  .  Not on file    Past Surgical History:  Procedure Laterality Date  . ABDOMINAL HYSTERECTOMY    . APPENDECTOMY    . CATARACT EXTRACTION Left   . CATARACT EXTRACTION W/PHACO Right 11/18/2013   Procedure: CATARACT EXTRACTION PHACO AND INTRAOCULAR LENS PLACEMENT (IOC);  Surgeon: Tonny Branch, MD;  Location: [facility];  Service: Ophthalmology;  Laterality: Right;  CDE:  7.76  . CESAREAN SECTION     x2  . CHEST WALL BIOPSY Right    removal of fatty tumor  . COLONOSCOPY  11/08/2004   [id number] rectosigmoid polyp/left side diverticula  . COLONOSCOPY  12/2009   RMR: left-sided diverticula, long tortuous colon  . COLONOSCOPY  2003   RMR: carcinoma in situ  . COLONOSCOPY N/A 04/07/2015   Procedure: COLONOSCOPY;  Surgeon: Daneil Dolin, MD;  Location: [facility];  Service: Endoscopy;  Laterality: N/A;  1100 - moved to 11/8 @ 11:30 - office to notify        Physical Exam: Blood pressure 116/63, pulse 83, temperature 97.8 F (36.6 C), temperature source Temporal,  height 5\' 7"  (1.702 m), weight 231 lb (104.8 kg).   Affect appropriate Healthy:  appears stated age 64: normal Neck supple with no adenopathy JVP normal no bruits no thyromegaly Lungs clear with no wheezing and good diaphragmatic motion Heart:  S1/S2 no murmur, no rub, gallop or click PMI normal Abdomen: benighn, BS positve, no tenderness, no AAA no bruit.  No HSM or HJR Distal pulses intact with no bruits No edema Neuro non-focal Skin warm and dry No muscular weakness   Labs:   Lab Results  Component Value Date   HGB 14.9 11/12/2013   HCT 43.1 11/12/2013   No results for input(s): NA, K, CL, CO2, BUN, CREATININE, CALCIUM, PROT, BILITOT, ALKPHOS, ALT, AST, GLUCOSE in the last 168 hours.  Invalid input(s): LABALBU No results found for: CKTOTAL, CKMB, CKMBINDEX, TROPONINI  Lab Results  Component Value Date   CHOL 132 01/16/2018   CHOL 126 05/12/2016   Lab Results  Component Value Date   HDL 38 01/16/2018   HDL 46 (L) 05/12/2016   Lab Results  Component Value Date   LDLCALC 73 01/16/2018   LDLCALC 58 05/12/2016   Lab Results  Component Value Date   TRIG 133 01/16/2018   TRIG 109 05/12/2016   Lab Results  Component Value Date   CHOLHDL 2.7 05/12/2016   No results found for: LDLDIRECT    Radiology: No results found.  EKG: 2015 SR PAC rate 86 low voltage    ASSESSMENT AND PLAN:   1. DM:  Discussed low carb diet.  Target hemoglobin A1c is 6.5 or less.  Continue current medications. 2. HTN:  Well controlled.  Continue current medications and low sodium Dash type diet.   3. HLD:  Continue statin LDL at goal 4. Chest Pain :  Atypical multiple risk factors unable to do ETT due to arthritis f/u lexiscan myovue   Signed: Jenkins Rouge 03/12/2019, 1:18 PM

## 2019-03-12 ENCOUNTER — Ambulatory Visit: Payer: Medicare Other | Admitting: Cardiovascular Disease

## 2019-03-12 ENCOUNTER — Other Ambulatory Visit: Payer: Self-pay

## 2019-03-12 ENCOUNTER — Encounter: Payer: Self-pay | Admitting: Cardiovascular Disease

## 2019-03-12 DIAGNOSIS — R079 Chest pain, unspecified: Secondary | ICD-10-CM

## 2019-03-12 NOTE — Patient Instructions (Signed)

## 2019-03-19 ENCOUNTER — Telehealth: Payer: Self-pay | Admitting: Cardiovascular Disease

## 2019-03-19 ENCOUNTER — Telehealth: Payer: Self-pay | Admitting: "Endocrinology

## 2019-03-19 DIAGNOSIS — R079 Chest pain, unspecified: Secondary | ICD-10-CM

## 2019-03-19 MED ORDER — TRESIBA FLEXTOUCH 100 UNIT/ML ~~LOC~~ SOPN: 40.0000 [IU] | PEN_INJECTOR | Freq: Every day | SUBCUTANEOUS | 2 refills | Status: DC

## 2019-03-19 MED ORDER — TRESIBA FLEXTOUCH 100 UNIT/ML ~~LOC~~ SOPN: PEN_INJECTOR | SUBCUTANEOUS | 0 refills | Status: DC

## 2019-03-19 NOTE — Telephone Encounter (Signed)
GXT ordered. Left pt detailed message.

## 2019-03-19 NOTE — Telephone Encounter (Signed)
That's fine

## 2019-03-19 NOTE — Telephone Encounter (Signed)
Rx sent 

## 2019-03-19 NOTE — Telephone Encounter (Signed)
Called pt. She states that she had a procedure done in 2017 and had a bad reaction. She does not want o do a chemical stress test. She would rather walk on treadmill. Please advise.

## 2019-03-19 NOTE — Telephone Encounter (Signed)
Patient is scheduled for stress test and has questions concerning tests. Please call patient. / tg

## 2019-03-19 NOTE — Addendum Note (Signed)
Addended by: Lavell Luster A on: 03/19/2019 03:38 PM   Modules accepted: Orders

## 2019-03-19 NOTE — Telephone Encounter (Signed)
Patient needs a 90 day supply on her TRESIBA FLEXTOUCH 100 UNIT/ML SOPN FlexTouch Pen. [facility]

## 2019-03-22 ENCOUNTER — Other Ambulatory Visit (HOSPITAL_COMMUNITY)
Admission: RE | Admit: 2019-03-22 | Discharge: 2019-03-22 | Disposition: A | Discharge status: Home / Self Care | Payer: Medicare Other | Source: Ambulatory Visit | Attending: Cardiovascular Disease | Admitting: Cardiovascular Disease

## 2019-03-22 ENCOUNTER — Other Ambulatory Visit: Payer: Self-pay

## 2019-03-22 DIAGNOSIS — Z20828 Contact with and (suspected) exposure to other viral communicable diseases: Secondary | ICD-10-CM | POA: Diagnosis not present

## 2019-03-22 DIAGNOSIS — Z01812 Encounter for preprocedural laboratory examination: Secondary | ICD-10-CM | POA: Insufficient documentation

## 2019-03-22 LAB — SARS CORONAVIRUS 2 (TAT 6-24 HRS): SARS Coronavirus 2: NEGATIVE

## 2019-03-26 ENCOUNTER — Other Ambulatory Visit (HOSPITAL_COMMUNITY): Payer: Medicare Other

## 2019-03-26 ENCOUNTER — Other Ambulatory Visit: Payer: Self-pay

## 2019-03-26 ENCOUNTER — Encounter (HOSPITAL_COMMUNITY): Payer: Medicare Other

## 2019-03-26 ENCOUNTER — Ambulatory Visit (HOSPITAL_COMMUNITY)
Admission: RE | Admit: 2019-03-26 | Discharge: 2019-03-26 | Disposition: A | Discharge status: Home / Self Care | Payer: Medicare Other | Source: Ambulatory Visit | Attending: Cardiovascular Disease | Admitting: Cardiovascular Disease

## 2019-03-26 DIAGNOSIS — R079 Chest pain, unspecified: Secondary | ICD-10-CM | POA: Insufficient documentation

## 2019-03-26 LAB — EXERCISE TOLERANCE TEST
Estimated workload: 6.3 METS
Exercise duration (min): 4 min
Exercise duration (sec): 23 s
MPHR: 146 {beats}/min
Peak HR: 126 {beats}/min
Percent HR: 86 %
RPE: 13
Rest HR: 86 {beats}/min

## 2019-03-27 LAB — CMP 10231
ALBUMIN/GLOBULIN RATIO: 2.5
ALT: 26 (ref 3–30)
AST: 19
Albumin: 4.2
Alkaline Phosphatase: 62
BUN: 12 (ref 4–21)
Calcium: 9.6
Carbon Dioxide, Total: 29
Chloride: 108
Creat: 0.79
EGFR (African American): 85
EGFR (Non-African Amer.): 74
Globulin: 1.7
Glucose: 101
Potassium: 3.8
Sodium: 144
Total Bilirubin: 0.5
Total Protein: 5.9 — AB (ref 6.4–8.2)

## 2019-03-27 LAB — TSH: TSH: 0.88 (ref 0.41–5.90)

## 2019-03-27 LAB — LIPID PANEL
Cholesterol: 166 (ref 0–200)
HDL: 43 (ref 35–70)
LDL Cholesterol: 95
Triglycerides: 183 — AB (ref 40–160)

## 2019-03-27 LAB — BASIC METABOLIC PANEL
BUN: 12 (ref 4–21)
Creatinine: 0.8 (ref 0.5–1.1)

## 2019-03-27 LAB — HEMOGLOBIN A1C
Hemoglobin A1C: 6.8
Hgb A1c MFr Bld: 6.8 — AB (ref 4.0–6.0)

## 2019-03-28 LAB — CBC WITH DIFF/PLATELET
BASO(ABSOLUTE): 22
Basophils: 0.5
Eosinophils Absolute: 99
Eosinophils, %: 2.3
HCT: 46 — AB (ref 29–41)
Hemoglobin: 15.7
Lymphocytes: 34.9
Lymphs Abs: 1501
MCH: 29.2
MCHC: 33.9
MCV: 86.2 (ref 76–111)
MPV: 10.3 fL (ref 7.5–11.5)
Monocytes(Absolute): 314
Monocytes: 7.3
Neutro Abs: 2365
Neutrophils: 55
RBC: 5.37 — AB (ref 3.87–5.11)
RDW: 12.4
WBC: 4.3
platelet count: 185

## 2019-03-28 LAB — URINALYSIS, COMPLETE
Bilirubin: NEGATIVE
Glucose: NEGATIVE
Ketones: NEGATIVE
Nitrite: NEGATIVE
Occult Blood: NEGATIVE
Protein: NEGATIVE
Specific Gravity: 1.013
pH: 5

## 2019-03-28 LAB — LIPID PANEL
Cholesterol, Total: 166
HDL Cholesterol: 43 (ref 35–70)
LDL Cholesterol: 95
Non HDL Cholesterol: 123
Total CHOL/HDL Ratio: 3.9
Triglycerides: 183 — AB (ref 40–160)

## 2019-03-28 LAB — TSH: TSH: 0.88

## 2019-04-04 ENCOUNTER — Other Ambulatory Visit: Payer: Self-pay

## 2019-04-04 ENCOUNTER — Ambulatory Visit (INDEPENDENT_AMBULATORY_CARE_PROVIDER_SITE_OTHER): Payer: Medicare Other | Admitting: "Endocrinology

## 2019-04-04 ENCOUNTER — Encounter: Payer: Self-pay | Admitting: "Endocrinology

## 2019-04-04 DIAGNOSIS — E118 Type 2 diabetes mellitus with unspecified complications: Secondary | ICD-10-CM | POA: Diagnosis not present

## 2019-04-04 DIAGNOSIS — E782 Mixed hyperlipidemia: Secondary | ICD-10-CM | POA: Diagnosis not present

## 2019-04-04 DIAGNOSIS — E1165 Type 2 diabetes mellitus with hyperglycemia: Secondary | ICD-10-CM | POA: Diagnosis not present

## 2019-04-04 DIAGNOSIS — I10 Essential (primary) hypertension: Secondary | ICD-10-CM

## 2019-04-04 DIAGNOSIS — Z794 Long term (current) use of insulin: Secondary | ICD-10-CM

## 2019-04-04 DIAGNOSIS — IMO0002 Reserved for concepts with insufficient information to code with codable children: Secondary | ICD-10-CM

## 2019-04-04 NOTE — Progress Notes (Signed)
04/04/2019                                                    Endocrinology Telehealth Visit Follow up Note -During COVID -19 Pandemic  This visit type was conducted due to national recommendations for restrictions regarding the COVID-19 Pandemic  in an effort to limit this patient's exposure and mitigate transmission of the corona virus.  Due to her co-morbid illnesses, Amanda Hopkins is at  moderate to high risk for complications without adequate follow up.  This format is felt to be most appropriate for her at this time.  I connected with this patient on 04/04/2019   by telephone and verified that I am speaking with the correct person using two identifiers. Amanda Hopkins, [birth year]. she has verbally consented to this visit. All issues noted in this document were discussed and addressed. The format was not optimal for physical exam.    Subjective:    Patient ID: Amanda Hopkins, female    DOB: July 21, 1945, PCP Sinda Du, MD   Past Medical History:  Diagnosis Date  . Arthritis   . Diabetes mellitus without complication ([place])   . Hyperlipidemia   . Hypertension   . Obesity   . Palpitations   . PONV (postoperative nausea and vomiting)   . Reflux esophagitis    Past Surgical History:  Procedure Laterality Date  . ABDOMINAL HYSTERECTOMY    . APPENDECTOMY    . CATARACT EXTRACTION Left   . CATARACT EXTRACTION W/PHACO Right 11/18/2013   Procedure: CATARACT EXTRACTION PHACO AND INTRAOCULAR LENS PLACEMENT (IOC);  Surgeon: Tonny Branch, MD;  Location: [facility];  Service: Ophthalmology;  Laterality: Right;  CDE:  7.76  . CESAREAN SECTION     x2  . CHEST WALL BIOPSY Right    removal of fatty tumor  . COLONOSCOPY  11/08/2004   [id number] rectosigmoid polyp/left side diverticula  . COLONOSCOPY  12/2009   RMR: left-sided diverticula, long tortuous colon  . COLONOSCOPY  2003   RMR: carcinoma in situ  . COLONOSCOPY N/A 04/07/2015   Procedure: COLONOSCOPY;  Surgeon: Daneil Dolin, MD;   Location: [facility];  Service: Endoscopy;  Laterality: N/A;  1100 - moved to 11/8 @ 11:30 - office to notify   Social History   Socioeconomic History  . Marital status: Married    Spouse name: Not on file  . Number of children: 2  . Years of education: Not on file  . Highest education level: Not on file  Occupational History  . Not on file  Social Needs  . Financial resource strain: Not on file  . Food insecurity    Worry: Not on file    Inability: Not on file  . Transportation needs    Medical: Not on file    Non-medical: Not on file  Tobacco Use  . Smoking status: Never Smoker  . Smokeless tobacco: Never Used  Substance and Sexual Activity  . Alcohol use: No  . Drug use: No  . Sexual activity: Yes    Birth control/protection: Surgical  Lifestyle  . Physical activity    Days per week: Not on file    Minutes per session: Not on file  . Stress: Not on file  Relationships  . Social connections    Talks on phone: Not on file  Gets together: Not on file    Attends religious service: Not on file    Active member of club or organization: Not on file    Attends meetings of clubs or organizations: Not on file    Relationship status: Not on file  Other Topics Concern  . Not on file  Social History Narrative  . Not on file   Outpatient Encounter Medications as of 04/04/2019  Medication Sig  . Blood Glucose Monitoring Suppl (ONETOUCH VERIO) w/Device KIT 1 each by Does not apply route as needed.  Marland Kitchen ibuprofen (ADVIL,MOTRIN) 200 MG tablet Take 200 mg by mouth every 6 (six) hours as needed.  . insulin degludec (TRESIBA FLEXTOUCH) 100 UNIT/ML SOPN FlexTouch Pen Inject 0.4 mLs (40 Units total) into the skin at bedtime.  Marland Kitchen loratadine (CLARITIN) 10 MG tablet Take 10 mg by mouth daily as needed for allergies.  Marland Kitchen losartan (COZAAR) 25 MG tablet Take 25 mg by mouth at bedtime.   Glory Rosebush VERIO test strip USE 1 STRIP TO CHECK GLUCOSE TWICE DAILY  . rosuvastatin (CRESTOR) 10 MG  tablet Take 10 mg by mouth at bedtime.   . TRULICITY 1.5 DG/3.8VF SOPN INJECT 1.5 MG INTO THE SKIN ONCE WEEKLY  . [DISCONTINUED] insulin degludec (TRESIBA FLEXTOUCH) 100 UNIT/ML SOPN FlexTouch Pen INJECT 40 UNITS INTO THE SKIN AT BEDTIME   No facility-administered encounter medications on file as of 04/04/2019.    ALLERGIES: Allergies  Allergen Reactions  . Erythromycin    VACCINATION STATUS:  There is no immunization history on file for this patient.  Diabetes She presents for her follow-up diabetic visit. She has type 2 diabetes mellitus. Onset time: She was diagnosed at approximate age of 89 years. Her disease course has been stable. There are no hypoglycemic associated symptoms. Pertinent negatives for hypoglycemia include no confusion, headaches, pallor or seizures. There are no diabetic associated symptoms. Pertinent negatives for diabetes include no chest pain, no polydipsia, no polyphagia and no polyuria. There are no hypoglycemic complications. Symptoms are stable. There are no diabetic complications. Risk factors for coronary artery disease include diabetes mellitus, dyslipidemia, hypertension, obesity and sedentary lifestyle. She is compliant with treatment most of the time. Her weight is decreasing steadily. She is following a diabetic diet. When asked about meal planning, she reported none. She has had a previous visit with a dietitian. She rarely participates in exercise. Her breakfast blood glucose range is generally 110-130 mg/dl. Her overall blood glucose range is 130-140 mg/dl. An ACE inhibitor/angiotensin II receptor blocker is being taken.  Hyperlipidemia This is a chronic problem. The current episode started more than 1 year ago. The problem is controlled. Exacerbating diseases include diabetes and obesity. Pertinent negatives include no chest pain, myalgias or shortness of breath. Current antihyperlipidemic treatment includes statins. Risk factors for coronary artery disease  include dyslipidemia, diabetes mellitus, hypertension, obesity, a sedentary lifestyle and post-menopausal.  Hypertension This is a chronic problem. The current episode started more than 1 year ago. The problem is controlled. Pertinent negatives include no chest pain, headaches, palpitations or shortness of breath. Risk factors for coronary artery disease include dyslipidemia and diabetes mellitus.   Review of systems: Limited as above.  Objective:    There were no vitals taken for this visit.  Wt Readings from Last 3 Encounters:  03/12/19 231 lb (104.8 kg)  10/02/18 230 lb (104.3 kg)  05/28/18 228 lb (103.4 kg)     CMP     Component Value Date/Time   NA 143 09/25/2018  1010   K 3.8 09/25/2018 1010   CL 107 09/25/2018 1010   CO2 29 09/25/2018 1010   GLUCOSE 138 09/25/2018 1010   BUN 12 03/27/2019   CREATININE 0.8 03/27/2019   CREATININE 0.73 09/25/2018 1010   CALCIUM 9.9 09/25/2018 1010   PROT 6.0 (L) 09/25/2018 1010   ALBUMIN 4.1 12/12/2016 0837   AST 22 09/25/2018 1010   ALT 27 09/25/2018 1010   ALKPHOS 71 12/12/2016 0837   BILITOT 0.5 09/25/2018 1010   GFRNONAA 81 09/25/2018 1010   GFRAA 94 09/25/2018 1010    Diabetic Labs (most recent): Lab Results  Component Value Date   HGBA1C 6.8 03/27/2019   HGBA1C 6.5 (H) 09/25/2018   HGBA1C 8.1 (H) 05/21/2018   Lipid Panel     Component Value Date/Time   CHOL 166 03/27/2019   TRIG 183 (A) 03/27/2019   HDL 43 03/27/2019   CHOLHDL 2.7 05/12/2016 0917   VLDL 22 05/12/2016 0917   LDLCALC 95 03/27/2019    Assessment & Plan:   1. Uncontrolled type 2 diabetes mellitus with complication, with long-term current use of insulin ([place])  - She remains at a high risk for more acute and chronic complications of diabetes which include CAD, CVA, CKD, retinopathy, and neuropathy. These are all discussed in detail with the patient.  -She reports controlled glycemic profile: Fasting ranging between 87-149, previsit A1c 6.8%.       Glucose logs and insulin administration records pertaining to this visit,  to be scanned into patient's records.  Recent labs reviewed.   - I have re-counseled the patient on diet management and weight loss  by adopting a carbohydrate restricted / protein rich  Diet.  - she  admits there is a room for improvement in her diet and drink choices. -  Suggestion is made for her to avoid simple carbohydrates  from her diet including Cakes, Sweet Desserts / Pastries, Ice Cream, Soda (diet and regular), Sweet Tea, Candies, Chips, Cookies, Sweet Pastries,  Store Bought Juices, Alcohol in Excess of  1-2 drinks a day, Artificial Sweeteners, Coffee Creamer, and "Sugar-free" Products. This will help patient to have stable blood glucose profile and potentially avoid unintended weight gain.  - Patient is advised to stick to a routine mealtimes to eat 3 meals  a day and avoid unnecessary snacks ( to snack only to correct hypoglycemia).  - I have approached patient with the following individualized plan to manage diabetes and patient agrees.   -She has history of intolerance to metformin.  She is advised to continue Tresiba 40 units nightly, associated with strict monitoring of blood glucose 2 times a day- daily before breakfast and at bedtime. -Patient is encouraged to call clinic for blood glucose levels less than 70 or above 150 mg /dl.  - She has tolerated and benefited from Trulicity, she is advised to continue Trulicity 1.5 mg subcutaneously weekly.  -Side effects and precautions discussed with her.   - Patient specific target  for A1c; LDL, HDL, Triglycerides, and  Waist Circumference were discussed in detail.  2) BP/HTN: Her blood pressure is controlled to target.  She is advised to continue her current blood pressure medications including losartan 25 mg p.o. daily.    3) Lipids/HPL: Her recent lipid panel showed controlled LDL at 73.  She is advised to continue Crestor 10 mg p.o. daily at bedtime.        4)  Weight/Diet: CDE consult in progress, exercise, and carbohydrates information provided.  5)  Chronic Care/Health Maintenance:  -Patient is on ACEI/ARB and Statin medications and encouraged to continue to follow up with Ophthalmology, Podiatrist at least yearly or according to recommendations, and advised to  stay away from smoking. I have recommended yearly flu vaccine and pneumonia vaccination at least every 5 years; moderate intensity exercise for up to 150 minutes weekly; and  sleep for at least 7 hours a day.  - I advised patient to maintain close follow up with Sinda Du, MD for primary care needs.  - Patient Care Time Today:  25 min, of which >50% was spent in  counseling and the rest reviewing her  current and  previous labs/studies, previous treatments, her blood glucose readings, and medications' doses and developing a plan for long-term care based on the latest recommendations for standards of care.   Amanda Hopkins participated in the discussions, expressed understanding, and voiced agreement with the above plans.  All questions were answered to her satisfaction. she is encouraged to contact clinic should she have any questions or concerns prior to her return visit.   Follow up plan: -Return in about 4 months (around 08/02/2019) for Bring Meter and Logs- A1c in Office, Include 8 log sheets.  Glade Lloyd, MD Phone: [phone]  Fax: [phone]  -  This note was partially dictated with voice recognition software. Similar sounding words can be transcribed inadequately or may not  be corrected upon review.  04/04/2019, 9:14 AM

## 2019-07-03 ENCOUNTER — Other Ambulatory Visit: Payer: Self-pay

## 2019-07-03 MED ORDER — TRESIBA FLEXTOUCH 100 UNIT/ML ~~LOC~~ SOPN: 40.0000 [IU] | PEN_INJECTOR | Freq: Every day | SUBCUTANEOUS | 0 refills | Status: DC

## 2019-07-04 ENCOUNTER — Other Ambulatory Visit: Payer: Self-pay

## 2019-07-04 MED ORDER — TRESIBA FLEXTOUCH 100 UNIT/ML ~~LOC~~ SOPN: 40.0000 [IU] | PEN_INJECTOR | Freq: Every day | SUBCUTANEOUS | 0 refills | Status: DC

## 2019-07-25 ENCOUNTER — Other Ambulatory Visit: Payer: Self-pay

## 2019-07-25 MED ORDER — ACCU-CHEK SOFTCLIX LANCETS MISC: 1.0000 | Freq: Two times a day (BID) | 1 refills | Status: DC

## 2019-07-25 MED ORDER — ACCU-CHEK GUIDE W/DEVICE KIT: 1.0000 | PACK | Freq: Two times a day (BID) | 0 refills | Status: AC

## 2019-07-25 MED ORDER — GLUCOSE BLOOD VI STRP: 1.0000 | ORAL_STRIP | 0 refills | Status: DC | PRN

## 2019-08-05 ENCOUNTER — Ambulatory Visit: Payer: Medicare PPO | Admitting: "Endocrinology

## 2019-08-05 ENCOUNTER — Encounter: Payer: Self-pay | Admitting: "Endocrinology

## 2019-08-05 ENCOUNTER — Other Ambulatory Visit: Payer: Self-pay

## 2019-08-05 VITALS — BP 121/76 | HR 85 | Ht 67.0 in | Wt 232.4 lb

## 2019-08-05 DIAGNOSIS — E118 Type 2 diabetes mellitus with unspecified complications: Secondary | ICD-10-CM

## 2019-08-05 DIAGNOSIS — IMO0002 Reserved for concepts with insufficient information to code with codable children: Secondary | ICD-10-CM

## 2019-08-05 DIAGNOSIS — E1165 Type 2 diabetes mellitus with hyperglycemia: Secondary | ICD-10-CM

## 2019-08-05 DIAGNOSIS — Z794 Long term (current) use of insulin: Secondary | ICD-10-CM | POA: Diagnosis not present

## 2019-08-05 DIAGNOSIS — E782 Mixed hyperlipidemia: Secondary | ICD-10-CM

## 2019-08-05 DIAGNOSIS — I10 Essential (primary) hypertension: Secondary | ICD-10-CM | POA: Diagnosis not present

## 2019-08-05 LAB — POCT GLYCOSYLATED HEMOGLOBIN (HGB A1C): Hemoglobin A1C: 7.4 % — AB (ref 4.0–5.6)

## 2019-08-05 MED ORDER — GLIPIZIDE ER 2.5 MG PO TB24: 2.5000 mg | ORAL_TABLET | Freq: Every day | ORAL | 1 refills | Status: DC

## 2019-08-05 NOTE — Progress Notes (Signed)
08/05/2019            Endocrinology follow-up note    Subjective:    Patient ID: Amanda Hopkins, female    DOB: 31-Jan-1946, PCP Sinda Du, MD   Past Medical History:  Diagnosis Date  . Arthritis   . Diabetes mellitus without complication ([place])   . Hyperlipidemia   . Hypertension   . Obesity   . Palpitations   . PONV (postoperative nausea and vomiting)   . Reflux esophagitis    Past Surgical History:  Procedure Laterality Date  . ABDOMINAL HYSTERECTOMY    . APPENDECTOMY    . CATARACT EXTRACTION Left   . CATARACT EXTRACTION W/PHACO Right 11/18/2013   Procedure: CATARACT EXTRACTION PHACO AND INTRAOCULAR LENS PLACEMENT (IOC);  Surgeon: Tonny Branch, MD;  Location: [facility];  Service: Ophthalmology;  Laterality: Right;  CDE:  7.76  . CESAREAN SECTION     x2  . CHEST WALL BIOPSY Right    removal of fatty tumor  . COLONOSCOPY  11/08/2004   XBD:[id number] rectosigmoid polyp/left side diverticula  . COLONOSCOPY  12/2009   RMR: left-sided diverticula, long tortuous colon  . COLONOSCOPY  2003   RMR: carcinoma in situ  . COLONOSCOPY N/A 04/07/2015   Procedure: COLONOSCOPY;  Surgeon: Daneil Dolin, MD;  Location: [facility];  Service: Endoscopy;  Laterality: N/A;  1100 - moved to 11/8 @ 11:30 - office to notify   Social History   Socioeconomic History  . Marital status: Married    Spouse name: Not on file  . Number of children: 2  . Years of education: Not on file  . Highest education level: Not on file  Occupational History  . Not on file  Tobacco Use  . Smoking status: Never Smoker  . Smokeless tobacco: Never Used  Substance and Sexual Activity  . Alcohol use: No  . Drug use: No  . Sexual activity: Yes    Birth control/protection: Surgical  Other Topics Concern  . Not on file  Social History Narrative  . Not on file   Social Determinants of Health   Financial Resource Strain:   . Difficulty of Paying Living Expenses: Not on file  Food Insecurity:   .  Worried About Charity fundraiser in the Last Year: Not on file  . Ran Out of Food in the Last Year: Not on file  Transportation Needs:   . Lack of Transportation (Medical): Not on file  . Lack of Transportation (Non-Medical): Not on file  Physical Activity:   . Days of Exercise per Week: Not on file  . Minutes of Exercise per Session: Not on file  Stress:   . Feeling of Stress : Not on file  Social Connections:   . Frequency of Communication with Friends and Family: Not on file  . Frequency of Social Gatherings with Friends and Family: Not on file  . Attends Religious Services: Not on file  . Active Member of Clubs or Organizations: Not on file  . Attends Archivist Meetings: Not on file  . Marital Status: Not on file   Outpatient Encounter Medications as of 08/05/2019  Medication Sig  . Accu-Chek Softclix Lancets lancets 1 each by Other route 2 (two) times daily. Use as instructed to test blood glucose two times daily.  . Blood Glucose Monitoring Suppl (ACCU-CHEK GUIDE) w/Device KIT 1 each by Does not apply route 2 (two) times daily. Use as directed to check blood glucose two times daily.  Marland Kitchen  glipiZIDE (GLUCOTROL XL) 2.5 MG 24 hr tablet Take 1 tablet (2.5 mg total) by mouth daily with breakfast.  . glucose blood test strip 1 each by Other route as needed. Use as instructed to test blood glucose two times daily  . ibuprofen (ADVIL,MOTRIN) 200 MG tablet Take 200 mg by mouth every 6 (six) hours as needed.  . insulin degludec (TRESIBA FLEXTOUCH) 100 UNIT/ML SOPN FlexTouch Pen Inject 0.4 mLs (40 Units total) into the skin at bedtime.  Marland Kitchen loratadine (CLARITIN) 10 MG tablet Take 10 mg by mouth daily as needed for allergies.  Marland Kitchen losartan (COZAAR) 25 MG tablet Take 25 mg by mouth at bedtime.   . rosuvastatin (CRESTOR) 10 MG tablet Take 10 mg by mouth at bedtime.   . TRULICITY 1.5 BT/5.1VO SOPN INJECT 1.5 MG INTO THE SKIN ONCE WEEKLY   No facility-administered encounter medications on  file as of 08/05/2019.   ALLERGIES: Allergies  Allergen Reactions  . Erythromycin    VACCINATION STATUS:  There is no immunization history on file for this patient.  Diabetes She presents for her follow-up diabetic visit. She has type 2 diabetes mellitus. Onset time: She was diagnosed at approximate age of 49 years. Her disease course has been worsening. There are no hypoglycemic associated symptoms. Pertinent negatives for hypoglycemia include no confusion, headaches, pallor or seizures. There are no diabetic associated symptoms. Pertinent negatives for diabetes include no chest pain, no polydipsia, no polyphagia and no polyuria. There are no hypoglycemic complications. Symptoms are stable. There are no diabetic complications. Risk factors for coronary artery disease include diabetes mellitus, dyslipidemia, hypertension, obesity and sedentary lifestyle. She is compliant with treatment most of the time. Her weight is decreasing steadily. She is following a diabetic diet. When asked about meal planning, she reported none. She has had a previous visit with a dietitian. She rarely participates in exercise. Her breakfast blood glucose range is generally 130-140 mg/dl. Her overall blood glucose range is 130-140 mg/dl. An ACE inhibitor/angiotensin II receptor blocker is being taken.  Hyperlipidemia This is a chronic problem. The current episode started more than 1 year ago. The problem is controlled. Exacerbating diseases include diabetes and obesity. Pertinent negatives include no chest pain, myalgias or shortness of breath. Current antihyperlipidemic treatment includes statins. Risk factors for coronary artery disease include dyslipidemia, diabetes mellitus, hypertension, obesity, a sedentary lifestyle and post-menopausal.  Hypertension This is a chronic problem. The current episode started more than 1 year ago. The problem is controlled. Pertinent negatives include no chest pain, headaches, palpitations  or shortness of breath. Risk factors for coronary artery disease include dyslipidemia and diabetes mellitus.    Review of systems  Constitutional: + Minimally fluctuating body weight,  current  Body mass index is 36.4 kg/m. , no fatigue, no subjective hyperthermia, no subjective hypothermia Eyes: no blurry vision, no xerophthalmia ENT: no sore throat, no nodules palpated in throat, no dysphagia/odynophagia, no hoarseness Cardiovascular: no Chest Pain, no Shortness of Breath, no palpitations, no leg swelling Respiratory: no cough, no shortness of breath Gastrointestinal: no Nausea/Vomiting/Diarhhea Musculoskeletal: no muscle/joint aches Skin: no rashes, no hyperemia Neurological: no tremors, no numbness, no tingling, no dizziness Psychiatric: no depression, no anxiety  Objective:    BP 121/76   Pulse 85   Ht 5' 7"  (1.702 m)   Wt 232 lb 6.4 oz (105.4 kg)   BMI 36.40 kg/m   Wt Readings from Last 3 Encounters:  08/05/19 232 lb 6.4 oz (105.4 kg)  03/12/19 231 lb (104.8 kg)  10/02/18 230 lb (104.3 kg)    Physical Exam- Limited  Constitutional:  Body mass index is 36.4 kg/m. , not in acute distress, normal state of mind Eyes:  EOMI, no exophthalmos Neck: Supple Thyroid: No gross goiter Respiratory: Adequate breathing efforts Musculoskeletal: no gross deformities, strength intact in all four extremities, no gross restriction of joint movements Skin:  no rashes, no hyperemia Neurological: no tremor with outstretched hands,    CMP     Component Value Date/Time   NA 144 03/27/2019 0933   K 3.8 03/27/2019 0933   CL 108 03/27/2019 0933   CO2 29 03/27/2019 0933   GLUCOSE 138 09/25/2018 1010   BUN 12 03/27/2019 0933   CREATININE 0.79 03/27/2019 0933   CALCIUM 9.6 03/27/2019 0933   PROT 5.9 (A) 03/27/2019 0933   ALBUMIN 4.2 03/27/2019 0933   AST 19 03/27/2019 0933   ALT 26 03/27/2019 0933   ALKPHOS 62 03/27/2019 0933   BILITOT 0.5 03/27/2019 0933   GFRNONAA 74 03/27/2019  0933   GFRNONAA 81 09/25/2018 1010   GFRAA 85 03/27/2019 0933   GFRAA 94 09/25/2018 1010    Diabetic Labs (most recent): Lab Results  Component Value Date   HGBA1C 7.4 (A) 08/05/2019   HGBA1C 6.8 (A) 03/27/2019   HGBA1C 6.8 03/27/2019   Lipid Panel     Component Value Date/Time   CHOL 166 03/27/2019 0933   TRIG 183 (A) 03/27/2019 0933   HDL 43 03/27/2019 0933   CHOLHDL 3.9 03/27/2019 0933   VLDL 22 05/12/2016 0917   LDLCALC 95 03/27/2019 0933    Assessment & Plan:   1. Uncontrolled type 2 diabetes mellitus with complication, with long-term current use of insulin ([place])  - She remains at a high risk for more acute and chronic complications of diabetes which include CAD, CVA, CKD, retinopathy, and neuropathy. These are all discussed in detail with the patient.  -She presents with controlled fasting glycemic profile averaging 154, point-of-care A1c 7.4% increasing from 6.8%.      Glucose logs and insulin administration records pertaining to this visit,  to be scanned into patient's records.  Recent labs reviewed.   - I have re-counseled the patient on diet management and weight loss  by adopting a carbohydrate restricted / protein rich  Diet.   - she  admits there is a room for improvement in her diet and drink choices. -  Suggestion is made for her to avoid simple carbohydrates  from her diet including Cakes, Sweet Desserts / Pastries, Ice Cream, Soda (diet and regular), Sweet Tea, Candies, Chips, Cookies, Sweet Pastries,  Store Bought Juices, Alcohol in Excess of  1-2 drinks a day, Artificial Sweeteners, Coffee Creamer, and "Sugar-free" Products. This will help patient to have stable blood glucose profile and potentially avoid unintended weight gain.   - Patient is advised to stick to a routine mealtimes to eat 3 meals  a day and avoid unnecessary snacks ( to snack only to correct hypoglycemia).  - I have approached patient with the following individualized plan to manage  diabetes and patient agrees.   -She has history of intolerance to metformin.  Based on tight fasting glycemic profile, she will not tolerate any higher dose of basal insulin.  She is advised to continue Tresiba 40 units nightly, continue to monitor blood glucose twice daily-daily before breakfast and at bedtime.    -Patient is encouraged to call clinic for blood glucose levels less than 70 or above 150 mg /dl.  -  She has tolerated and benefited from Trulicity, she is advised to continue Trulicity 1.5 mg subcutaneously weekly.  -She would benefit from a low-dose glipizide.  I discussed and added glipizide 2.5 mg XL p.o. daily at breakfast. -Side effects and precautions discussed with her.   - Patient specific target  for A1c; LDL, HDL, Triglycerides, and  Waist Circumference were discussed in detail.  2) BP/HTN: Her blood pressure is controlled to target.  She is advised to continue her current blood pressure medications including losartan 25 mg p.o. daily.    3) Lipids/HPL: Her recent lipid panel showed controlled LDL at 73.  She is advised to continue Crestor 10 mg p.o. daily at bedtime.  Side effects and precautions discussed with her.    4)  Weight/Diet: CDE consult in progress, exercise, and carbohydrates information provided.  5) Chronic Care/Health Maintenance:  -Patient is on ACEI/ARB and Statin medications and encouraged to continue to follow up with Ophthalmology, Podiatrist at least yearly or according to recommendations, and advised to  stay away from smoking. I have recommended yearly flu vaccine and pneumonia vaccination at least every 5 years; moderate intensity exercise for up to 150 minutes weekly; and  sleep for at least 7 hours a day.  - I advised patient to maintain close follow up with Sinda Du, MD for primary care needs.  - Time spent on this patient care encounter:  35 min, of which >50% was spent in  counseling and the rest reviewing her  current and  previous  labs/studies ( including abstraction from other facilities),  previous treatments, her blood glucose readings, and medications' doses and developing a plan for long-term care based on the latest recommendations for standards of care; and documenting her care.  Ruthe Mannan participated in the discussions, expressed understanding, and voiced agreement with the above plans.  All questions were answered to her satisfaction. she is encouraged to contact clinic should she have any questions or concerns prior to her return visit.    Follow up plan: -Return in about 3 months (around 11/05/2019) for Bring Meter and Logs- A1c in Office, Follow up with Pre-visit Labs.  Glade Lloyd, MD Phone: [phone]  Fax: [phone]  -  This note was partially dictated with voice recognition software. Similar sounding words can be transcribed inadequately or may not  be corrected upon review.  08/05/2019, 1:36 PM

## 2019-08-05 NOTE — Patient Instructions (Signed)

## 2019-08-27 ENCOUNTER — Encounter: Payer: Self-pay | Admitting: Family Medicine

## 2019-09-03 ENCOUNTER — Ambulatory Visit: Payer: Medicare Other | Admitting: Family Medicine

## 2019-09-10 ENCOUNTER — Other Ambulatory Visit: Payer: Self-pay

## 2019-09-10 MED ORDER — GLUCOSE BLOOD VI STRP: 1.0000 | ORAL_STRIP | Freq: Two times a day (BID) | 0 refills | Status: DC

## 2019-09-28 ENCOUNTER — Other Ambulatory Visit: Payer: Self-pay | Admitting: "Endocrinology

## 2019-11-01 DIAGNOSIS — E119 Type 2 diabetes mellitus without complications: Secondary | ICD-10-CM | POA: Diagnosis not present

## 2019-11-01 DIAGNOSIS — E782 Mixed hyperlipidemia: Secondary | ICD-10-CM | POA: Diagnosis not present

## 2019-11-01 DIAGNOSIS — I10 Essential (primary) hypertension: Secondary | ICD-10-CM | POA: Diagnosis not present

## 2019-11-01 DIAGNOSIS — Z0001 Encounter for general adult medical examination with abnormal findings: Secondary | ICD-10-CM | POA: Diagnosis not present

## 2019-11-01 DIAGNOSIS — M25562 Pain in left knee: Secondary | ICD-10-CM | POA: Diagnosis not present

## 2019-11-01 DIAGNOSIS — E039 Hypothyroidism, unspecified: Secondary | ICD-10-CM | POA: Diagnosis not present

## 2019-11-01 DIAGNOSIS — Z6836 Body mass index (BMI) 36.0-36.9, adult: Secondary | ICD-10-CM | POA: Diagnosis not present

## 2019-11-04 DIAGNOSIS — I10 Essential (primary) hypertension: Secondary | ICD-10-CM | POA: Diagnosis not present

## 2019-11-04 DIAGNOSIS — Z0000 Encounter for general adult medical examination without abnormal findings: Secondary | ICD-10-CM | POA: Diagnosis not present

## 2019-11-04 DIAGNOSIS — E119 Type 2 diabetes mellitus without complications: Secondary | ICD-10-CM | POA: Diagnosis not present

## 2019-11-04 DIAGNOSIS — Z6835 Body mass index (BMI) 35.0-35.9, adult: Secondary | ICD-10-CM | POA: Diagnosis not present

## 2019-11-04 LAB — COMPREHENSIVE METABOLIC PANEL
Albumin: 4.2 (ref 3.5–5.0)
Calcium: 9.8 (ref 8.7–10.7)
GFR calc Af Amer: 98
GFR calc non Af Amer: 85

## 2019-11-04 LAB — BASIC METABOLIC PANEL
BUN: 15 (ref 4–21)
CO2: 22 (ref 13–22)
Chloride: 107 (ref 99–108)
Creatinine: 0.7 (ref 0.5–1.1)
Potassium: 3.8 (ref 3.4–5.3)

## 2019-11-04 LAB — HEMOGLOBIN A1C: Hemoglobin A1C: 6.9

## 2019-11-04 LAB — TSH: TSH: 0.98 (ref 0.41–5.90)

## 2019-11-06 ENCOUNTER — Ambulatory Visit: Payer: Medicare PPO | Admitting: "Endocrinology

## 2019-11-14 DIAGNOSIS — H109 Unspecified conjunctivitis: Secondary | ICD-10-CM | POA: Diagnosis not present

## 2019-11-27 DIAGNOSIS — S83242A Other tear of medial meniscus, current injury, left knee, initial encounter: Secondary | ICD-10-CM | POA: Diagnosis not present

## 2019-11-27 DIAGNOSIS — M25562 Pain in left knee: Secondary | ICD-10-CM | POA: Diagnosis not present

## 2019-11-27 DIAGNOSIS — M1712 Unilateral primary osteoarthritis, left knee: Secondary | ICD-10-CM | POA: Diagnosis not present

## 2019-12-03 DIAGNOSIS — M25562 Pain in left knee: Secondary | ICD-10-CM | POA: Diagnosis not present

## 2019-12-16 ENCOUNTER — Other Ambulatory Visit: Payer: Self-pay | Admitting: "Endocrinology

## 2019-12-17 ENCOUNTER — Other Ambulatory Visit: Payer: Self-pay

## 2019-12-17 DIAGNOSIS — IMO0002 Reserved for concepts with insufficient information to code with codable children: Secondary | ICD-10-CM

## 2019-12-17 MED ORDER — GLUCOSE BLOOD VI STRP: 1.0000 | ORAL_STRIP | Freq: Two times a day (BID) | 0 refills | Status: AC

## 2019-12-31 ENCOUNTER — Other Ambulatory Visit: Payer: Self-pay

## 2019-12-31 ENCOUNTER — Encounter: Payer: Self-pay | Admitting: Nurse Practitioner

## 2019-12-31 ENCOUNTER — Ambulatory Visit: Payer: Medicare PPO | Admitting: Nurse Practitioner

## 2019-12-31 VITALS — BP 112/76 | HR 82 | Ht 67.0 in | Wt 232.4 lb

## 2019-12-31 DIAGNOSIS — Z794 Long term (current) use of insulin: Secondary | ICD-10-CM

## 2019-12-31 DIAGNOSIS — E782 Mixed hyperlipidemia: Secondary | ICD-10-CM

## 2019-12-31 DIAGNOSIS — E118 Type 2 diabetes mellitus with unspecified complications: Secondary | ICD-10-CM | POA: Diagnosis not present

## 2019-12-31 DIAGNOSIS — E559 Vitamin D deficiency, unspecified: Secondary | ICD-10-CM

## 2019-12-31 DIAGNOSIS — IMO0002 Reserved for concepts with insufficient information to code with codable children: Secondary | ICD-10-CM

## 2019-12-31 DIAGNOSIS — I10 Essential (primary) hypertension: Secondary | ICD-10-CM

## 2019-12-31 DIAGNOSIS — E1165 Type 2 diabetes mellitus with hyperglycemia: Secondary | ICD-10-CM | POA: Diagnosis not present

## 2019-12-31 NOTE — Patient Instructions (Signed)

## 2019-12-31 NOTE — Progress Notes (Signed)
12/31/2019            Endocrinology follow-up note    Subjective:    Patient ID: Amanda Hopkins, female    DOB: Jan 16, 1946, PCP Allwardt, Yetta Flock, PA   Past Medical History:  Diagnosis Date  . Arthritis   . Diabetes mellitus without complication ([place])   . Hyperlipidemia   . Hypertension   . Obesity   . Palpitations   . PONV (postoperative nausea and vomiting)   . Reflux esophagitis    Past Surgical History:  Procedure Laterality Date  . ABDOMINAL HYSTERECTOMY    . APPENDECTOMY    . CATARACT EXTRACTION Left   . CATARACT EXTRACTION W/PHACO Right 11/18/2013   Procedure: CATARACT EXTRACTION PHACO AND INTRAOCULAR LENS PLACEMENT (IOC);  Surgeon: Tonny Branch, MD;  Location: [facility];  Service: Ophthalmology;  Laterality: Right;  CDE:  7.76  . CESAREAN SECTION     x2  . CHEST WALL BIOPSY Right    removal of fatty tumor  . COLONOSCOPY  11/08/2004   VOH:[id number] rectosigmoid polyp/left side diverticula  . COLONOSCOPY  12/2009   RMR: left-sided diverticula, long tortuous colon  . COLONOSCOPY  2003   RMR: carcinoma in situ  . COLONOSCOPY N/A 04/07/2015   Procedure: COLONOSCOPY;  Surgeon: Daneil Dolin, MD;  Location: [facility];  Service: Endoscopy;  Laterality: N/A;  1100 - moved to 11/8 @ 11:30 - office to notify   Social History   Socioeconomic History  . Marital status: Married    Spouse name: Not on file  . Number of children: 2  . Years of education: Not on file  . Highest education level: Not on file  Occupational History  . Not on file  Tobacco Use  . Smoking status: Never Smoker  . Smokeless tobacco: Never Used  Vaping Use  . Vaping Use: Never used  Substance and Sexual Activity  . Alcohol use: No  . Drug use: No  . Sexual activity: Yes    Birth control/protection: Surgical  Other Topics Concern  . Not on file  Social History Narrative  . Not on file   Social Determinants of Health   Financial Resource Strain:   . Difficulty of Paying Living  Expenses:   Food Insecurity:   . Worried About Charity fundraiser in the Last Year:   . Arboriculturist in the Last Year:   Transportation Needs:   . Film/video editor (Medical):   Marland Kitchen Lack of Transportation (Non-Medical):   Physical Activity:   . Days of Exercise per Week:   . Minutes of Exercise per Session:   Stress:   . Feeling of Stress :   Social Connections:   . Frequency of Communication with Friends and Family:   . Frequency of Social Gatherings with Friends and Family:   . Attends Religious Services:   . Active Member of Clubs or Organizations:   . Attends Archivist Meetings:   Marland Kitchen Marital Status:    Outpatient Encounter Medications as of 12/31/2019  Medication Sig  . Accu-Chek Softclix Lancets lancets USE 1  TO CHECK GLUCOSE TWICE DAILY  . Blood Glucose Monitoring Suppl (ACCU-CHEK GUIDE) w/Device KIT 1 each by Does not apply route 2 (two) times daily. Use as directed to check blood glucose two times daily.  Marland Kitchen glipiZIDE (GLUCOTROL XL) 2.5 MG 24 hr tablet Take 1 tablet (2.5 mg total) by mouth daily with breakfast.  . glucose blood test strip 1 each by  Other route 2 (two) times daily. Use as instructed to test blood glucose two times daily ACCU-CHEK GUIDE TEST STRIPS  . ibuprofen (ADVIL,MOTRIN) 200 MG tablet Take 200 mg by mouth every 6 (six) hours as needed.  . insulin degludec (TRESIBA FLEXTOUCH) 100 UNIT/ML SOPN FlexTouch Pen Inject 0.4 mLs (40 Units total) into the skin at bedtime.  Marland Kitchen loratadine (CLARITIN) 10 MG tablet Take 10 mg by mouth daily as needed for allergies.  Marland Kitchen losartan (COZAAR) 25 MG tablet Take 25 mg by mouth at bedtime.   . rosuvastatin (CRESTOR) 10 MG tablet Take 10 mg by mouth at bedtime.   . TRULICITY 1.5 PJ/0.9TO SOPN INJECT 1.5 MG INTO THE SKIN ONCE WEEKLY  . B-D UF III MINI PEN NEEDLES 31G X 5 MM MISC    No facility-administered encounter medications on file as of 12/31/2019.   ALLERGIES: Allergies  Allergen Reactions  . Erythromycin    . Ivp Dye [Iodinated Diagnostic Agents]     Red and itchy   VACCINATION STATUS:  There is no immunization history on file for this patient.  Diabetes She presents for her follow-up diabetic visit. She has type 2 diabetes mellitus. Onset time: She was diagnosed at approximate age of 32 years. Her disease course has been improving. There are no hypoglycemic associated symptoms. Pertinent negatives for hypoglycemia include no confusion, headaches, pallor or seizures. There are no diabetic associated symptoms. Pertinent negatives for diabetes include no chest pain, no polydipsia, no polyphagia, no polyuria and no weight loss. There are no hypoglycemic complications. Symptoms are stable. There are no diabetic complications. Risk factors for coronary artery disease include diabetes mellitus, dyslipidemia, hypertension, obesity and sedentary lifestyle. Current diabetic treatment includes insulin injections and oral agent (monotherapy). She is compliant with treatment most of the time. Her weight is fluctuating minimally. She is following a diabetic and generally healthy diet. When asked about meal planning, she reported none. She has had a previous visit with a dietitian. She rarely participates in exercise. Her home blood glucose trend is decreasing steadily. Her breakfast blood glucose range is generally 70-90 mg/dl. Her overall blood glucose range is 90-110 mg/dl. (Patient presents today with logs showing tight fasting glycemic profile.  Her A1c on previsit labs was 6.9%.  Based off of her glucose monitor 30-90-day averages, her A1c should be between 5.5 and 6.  There are some mild, random episodes of hypoglycemia documented.) An ACE inhibitor/angiotensin II receptor blocker is being taken. She does not see a podiatrist.Eye exam is current.  Hyperlipidemia This is a chronic problem. The current episode started more than 1 year ago. The problem is controlled. Exacerbating diseases include diabetes and  obesity. Pertinent negatives include no chest pain, myalgias or shortness of breath. Current antihyperlipidemic treatment includes statins. Risk factors for coronary artery disease include dyslipidemia, diabetes mellitus, hypertension, obesity, a sedentary lifestyle and post-menopausal.  Hypertension This is a chronic problem. The current episode started more than 1 year ago. The problem is controlled. Pertinent negatives include no chest pain, headaches, palpitations or shortness of breath. Risk factors for coronary artery disease include dyslipidemia and diabetes mellitus.    Review of systems  Constitutional: + Minimally fluctuating body weight,  current  Body mass index is 36.4 kg/m. , no fatigue, no subjective hyperthermia, no subjective hypothermia Eyes: no blurry vision, no xerophthalmia ENT: no sore throat, no nodules palpated in throat, no dysphagia/odynophagia, no hoarseness Cardiovascular: no Chest Pain, no Shortness of Breath, no palpitations, no leg swelling Respiratory: no cough,  no shortness of breath Gastrointestinal: no Nausea/Vomiting/Diarhhea Musculoskeletal: no muscle/joint aches Skin: no rashes, no hyperemia Neurological: no tremors, no numbness, no tingling, no dizziness Psychiatric: no depression, no anxiety  Objective:    BP 112/76 (BP Location: Right Arm, Patient Position: Sitting)   Pulse 82   Ht _0  (1.702 m)   Wt 232 lb 6.4 oz (105.4 kg)   BMI 36.40 kg/m   Wt Readings from Last 3 Encounters:  12/31/19 232 lb 6.4 oz (105.4 kg)  08/05/19 232 lb 6.4 oz (105.4 kg)  03/12/19 231 lb (104.8 kg)     Physical Exam- Limited  Constitutional:  Body mass index is 36.4 kg/m. , not in acute distress, normal state of mind Eyes:  EOMI, no exophthalmos Neck: Supple Thyroid: No gross goiter Cardiovascular: RRR, no murmers, rubs, or gallops, no edema Respiratory: Adequate breathing efforts, no crackles, rales, rhonchi, or wheezing Musculoskeletal: no gross  deformities, strength intact in all four extremities, no gross restriction of joint movements Skin:  no rashes, no hyperemia Neurological: no tremor with outstretched hands    CMP     Component Value Date/Time   NA 144 03/27/2019 0933   K 3.8 11/04/2019 0000   K 3.8 03/27/2019 0933   CL 107 11/04/2019 0000   CL 108 03/27/2019 0933   CO2 22 11/04/2019 0000   CO2 29 03/27/2019 0933   GLUCOSE 138 09/25/2018 1010   BUN 15 11/04/2019 0000   CREATININE 0.7 11/04/2019 0000   CREATININE 0.79 03/27/2019 0933   CALCIUM 9.8 11/04/2019 0000   CALCIUM 9.6 03/27/2019 0933   PROT 5.9 (A) 03/27/2019 0933   ALBUMIN 4.2 11/04/2019 0000   ALBUMIN 4.2 03/27/2019 0933   AST 19 03/27/2019 0933   ALT 26 03/27/2019 0933   ALKPHOS 62 03/27/2019 0933   BILITOT 0.5 03/27/2019 0933   GFRNONAA 85 11/04/2019 0000   GFRNONAA 74 03/27/2019 0933   GFRNONAA 81 09/25/2018 1010   GFRAA 98 11/04/2019 0000   GFRAA 85 03/27/2019 0933   GFRAA 94 09/25/2018 1010    Diabetic Labs (most recent): Lab Results  Component Value Date   HGBA1C 6.9 11/04/2019   HGBA1C 7.4 (A) 08/05/2019   HGBA1C 6.8 (A) 03/27/2019   Lipid Panel     Component Value Date/Time   CHOL 166 03/27/2019 0933   TRIG 183 (A) 03/27/2019 0933   HDL 43 03/27/2019 0933   CHOLHDL 3.9 03/27/2019 0933   VLDL 22 05/12/2016 0917   [place] 95 03/27/2019 0933    Assessment & Plan:   1. Uncontrolled type 2 diabetes mellitus with complication, with long-term current use of insulin ([place])  - She remains at a high risk for more acute and chronic complications of diabetes which include CAD, CVA, CKD, retinopathy, and neuropathy. These are all discussed in detail with the patient.  Patient presents today with logs showing tight fasting glycemic profile.  Her A1c on previsit labs was 6.9%.  Based off of her glucose monitor 30-90-day averages, her A1c should be between 5.5 and 6.  There are some mild, random episodes of hypoglycemia documented.    Recent labs reviewed.  - I have re-counseled the patient on diet management and weight loss  by adopting a carbohydrate restricted / protein rich  Diet.  -Patient states she has modified her lifestyle, meal choices to help to lose weight, but she has not seen any weight loss yet. -  Suggestion is made for the patient to avoid simple carbohydrates from their diet  including Cakes, Sweet Desserts / Pastries, Ice Cream, Soda (diet and regular), Sweet Tea, Candies, Chips, Cookies, Sweet Pastries,  Store Bought Juices, Alcohol in Excess of  1-2 drinks a day, Artificial Sweeteners, Coffee Creamer, and "Sugar-free" Products. This will help patient to have stable blood glucose profile and potentially avoid unintended weight gain.   - I encouraged the patient to switch to  unprocessed or minimally processed complex starch and increased protein intake (animal or plant source), fruits, and vegetables.   - Patient is advised to stick to a routine mealtimes to eat 3 meals  a day and avoid unnecessary snacks ( to snack only to correct hypoglycemia).  - I have approached patient with the following individualized plan to manage diabetes and patient agrees.  Based on her tight fasting glycemic profile, I have advised her to stop the glipizide 2.5 mg XL for now.  She is to continue Tresiba 40 units SQ nightly, and Trulicity 1.5 SQ weekly. Encouraged patient to continue testing blood glucose twice daily, before breakfast, and before bed.  She states that she forgets to do the reading before bed frequently.  -She has history of intolerance to metformin.  -Patient is encouraged to call clinic for blood glucose levels less than 70 or above 200 mg /dl for 3 consecutive tests.  - Patient specific target  for A1c; LDL, HDL, Triglycerides, and  Waist Circumference were discussed in detail.  2) BP/HTN:  Her blood pressure is controlled to target she is encouraged to continue losartan 25 mg p.o. daily at bedtime.  3)  Lipids/HPL:  Her most recent lipid panel shows LDL controlled at 95 and elevated triglycerides at 183.  This was done on 03/27/2019.  Will recheck lipid profile prior to next visit in 4 months.  She is advised to continue Crestor 10 mg p.o. daily at bedtime.  Side effects and precautions discussed with her.  4)  Weight/Diet:  Patient does not feel like she would benefit from a CDE consult.  Exercise, and carbohydrates information provided.  5) Chronic Care/Health Maintenance:  -Patient is on ACEI/ARB and Statin medications and encouraged to continue to follow up with Ophthalmology, Podiatrist at least yearly or according to recommendations, and advised to  stay away from smoking. I have recommended yearly flu vaccine and pneumonia vaccination at least every 5 years; moderate intensity exercise for up to 150 minutes weekly; and  sleep for at least 7 hours a day.  - I advised patient to maintain close follow up with Allwardt, Alyssa, PA for primary care needs.  - Time spent on this patient care encounter:  35 min, of which > 50% was spent in  counseling and the rest reviewing her blood glucose logs , discussing her hypoglycemia and hyperglycemia episodes, reviewing her current and  previous labs / studies  ( including abstraction from other facilities) and medications  doses and developing a  long term treatment plan and documenting her care.   Please refer to Patient Instructions for Blood Glucose Monitoring and Insulin/Medications Dosing Guide"  in media tab for additional information. Please  also refer to " Patient Self Inventory" in the Media  tab for reviewed elements of pertinent patient history.  Amanda Hopkins participated in the discussions, expressed understanding, and voiced agreement with the above plans.  All questions were answered to her satisfaction. she is encouraged to contact clinic should she have any questions or concerns prior to her return visit.    Follow up plan: -Return  in  about 4 months (around 05/01/2020) for Diabetes follow up- A1c and urine micro in office, Previsit labs, Bring glucometer and logs.  Rayetta Pigg, FNP-BC [facility] Phone: [phone] Fax: [phone]   12/31/2019, 10:49 AM

## 2020-01-13 ENCOUNTER — Other Ambulatory Visit: Payer: Self-pay | Admitting: "Endocrinology

## 2020-01-15 ENCOUNTER — Telehealth: Payer: Self-pay | Admitting: "Endocrinology

## 2020-01-15 ENCOUNTER — Other Ambulatory Visit: Payer: Self-pay

## 2020-01-15 DIAGNOSIS — IMO0002 Reserved for concepts with insufficient information to code with codable children: Secondary | ICD-10-CM

## 2020-01-15 DIAGNOSIS — E1165 Type 2 diabetes mellitus with hyperglycemia: Secondary | ICD-10-CM

## 2020-01-15 MED ORDER — TRESIBA FLEXTOUCH 100 UNIT/ML ~~LOC~~ SOPN: 40.0000 [IU] | PEN_INJECTOR | Freq: Every day | SUBCUTANEOUS | 0 refills | Status: DC

## 2020-01-15 MED ORDER — TRESIBA FLEXTOUCH 100 UNIT/ML ~~LOC~~ SOPN: 40.0000 [IU] | PEN_INJECTOR | Freq: Every day | SUBCUTANEOUS | 1 refills | Status: DC

## 2020-01-15 NOTE — Telephone Encounter (Signed)
Estill Bamberg is calling from [facility] and is needing a new rx for Tyler Aas for the patient and states if 2 boxes could be sent in as it is a lot cheaper for the patient

## 2020-01-15 NOTE — Telephone Encounter (Signed)
SENT IN

## 2020-01-15 NOTE — Telephone Encounter (Signed)
Rx sent 

## 2020-02-28 DIAGNOSIS — E119 Type 2 diabetes mellitus without complications: Secondary | ICD-10-CM | POA: Diagnosis not present

## 2020-02-28 DIAGNOSIS — E039 Hypothyroidism, unspecified: Secondary | ICD-10-CM | POA: Diagnosis not present

## 2020-02-28 DIAGNOSIS — I10 Essential (primary) hypertension: Secondary | ICD-10-CM | POA: Diagnosis not present

## 2020-02-28 DIAGNOSIS — E782 Mixed hyperlipidemia: Secondary | ICD-10-CM | POA: Diagnosis not present

## 2020-03-04 DIAGNOSIS — M25562 Pain in left knee: Secondary | ICD-10-CM | POA: Diagnosis not present

## 2020-03-04 DIAGNOSIS — I10 Essential (primary) hypertension: Secondary | ICD-10-CM | POA: Diagnosis not present

## 2020-03-04 DIAGNOSIS — Z6836 Body mass index (BMI) 36.0-36.9, adult: Secondary | ICD-10-CM | POA: Diagnosis not present

## 2020-03-04 DIAGNOSIS — E119 Type 2 diabetes mellitus without complications: Secondary | ICD-10-CM | POA: Diagnosis not present

## 2020-03-27 DIAGNOSIS — M25562 Pain in left knee: Secondary | ICD-10-CM | POA: Diagnosis not present

## 2020-03-30 ENCOUNTER — Other Ambulatory Visit: Payer: Self-pay

## 2020-03-31 ENCOUNTER — Other Ambulatory Visit: Payer: Self-pay

## 2020-03-31 MED ORDER — BD PEN NEEDLE MINI U/F 31G X 5 MM MISC
1.0000 | Freq: Every day | 2 refills | Status: DC
Start: 2020-03-31 — End: 2021-01-26

## 2020-03-31 MED ORDER — TRULICITY 1.5 MG/0.5ML ~~LOC~~ SOAJ: 1.5000 mg | SUBCUTANEOUS | 0 refills | Status: DC

## 2020-04-08 DIAGNOSIS — S83232A Complex tear of medial meniscus, current injury, left knee, initial encounter: Secondary | ICD-10-CM | POA: Diagnosis not present

## 2020-04-08 DIAGNOSIS — M1712 Unilateral primary osteoarthritis, left knee: Secondary | ICD-10-CM | POA: Diagnosis not present

## 2020-04-08 DIAGNOSIS — M94262 Chondromalacia, left knee: Secondary | ICD-10-CM | POA: Diagnosis not present

## 2020-04-08 DIAGNOSIS — G8918 Other acute postprocedural pain: Secondary | ICD-10-CM | POA: Diagnosis not present

## 2020-04-08 DIAGNOSIS — M2242 Chondromalacia patellae, left knee: Secondary | ICD-10-CM | POA: Diagnosis not present

## 2020-04-08 DIAGNOSIS — S83242A Other tear of medial meniscus, current injury, left knee, initial encounter: Secondary | ICD-10-CM | POA: Diagnosis not present

## 2020-04-10 ENCOUNTER — Encounter: Payer: Self-pay | Admitting: Internal Medicine

## 2020-04-13 DIAGNOSIS — M25562 Pain in left knee: Secondary | ICD-10-CM | POA: Diagnosis not present

## 2020-04-21 DIAGNOSIS — M25562 Pain in left knee: Secondary | ICD-10-CM | POA: Diagnosis not present

## 2020-04-27 ENCOUNTER — Other Ambulatory Visit: Payer: Self-pay | Admitting: Nurse Practitioner

## 2020-04-27 ENCOUNTER — Other Ambulatory Visit: Payer: Self-pay | Admitting: "Endocrinology

## 2020-04-27 DIAGNOSIS — E118 Type 2 diabetes mellitus with unspecified complications: Secondary | ICD-10-CM | POA: Diagnosis not present

## 2020-04-27 DIAGNOSIS — Z794 Long term (current) use of insulin: Secondary | ICD-10-CM | POA: Diagnosis not present

## 2020-04-27 DIAGNOSIS — E559 Vitamin D deficiency, unspecified: Secondary | ICD-10-CM | POA: Diagnosis not present

## 2020-04-27 DIAGNOSIS — E1165 Type 2 diabetes mellitus with hyperglycemia: Secondary | ICD-10-CM | POA: Diagnosis not present

## 2020-04-28 DIAGNOSIS — M25562 Pain in left knee: Secondary | ICD-10-CM | POA: Diagnosis not present

## 2020-04-28 LAB — COMPLETE METABOLIC PANEL WITH GFR
AG Ratio: 2.1 (calc) (ref 1.0–2.5)
ALT: 24 U/L (ref 6–29)
AST: 19 U/L (ref 10–35)
Albumin: 4 g/dL (ref 3.6–5.1)
Alkaline phosphatase (APISO): 64 U/L (ref 37–153)
BUN: 12 mg/dL (ref 7–25)
CO2: 29 mmol/L (ref 20–32)
Calcium: 9.3 mg/dL (ref 8.6–10.4)
Chloride: 110 mmol/L (ref 98–110)
Creat: 0.74 mg/dL (ref 0.60–0.93)
GFR, Est African American: 92 mL/min/{1.73_m2} (ref >=60)
GFR, Est Non African American: 79 mL/min/{1.73_m2} (ref >=60)
Globulin: 1.9 g/dL (calc) (ref 1.9–3.7)
Glucose, Bld: 194 mg/dL — ABNORMAL HIGH (ref 65–139)
Potassium: 4.2 mmol/L (ref 3.5–5.3)
Sodium: 144 mmol/L (ref 135–146)
Total Bilirubin: 0.6 mg/dL (ref 0.2–1.2)
Total Protein: 5.9 g/dL — ABNORMAL LOW (ref 6.1–8.1)

## 2020-04-28 LAB — LIPID PANEL
Cholesterol: 157 mg/dL (ref <200)
HDL: 55 mg/dL (ref >=50)
LDL Cholesterol (Calc): 78 mg/dL (calc)
Non-HDL Cholesterol (Calc): 102 mg/dL (calc) (ref <130)
Total CHOL/HDL Ratio: 2.9 (calc) (ref <5.0)
Triglycerides: 144 mg/dL (ref <150)

## 2020-04-28 LAB — VITAMIN D 25 HYDROXY (VIT D DEFICIENCY, FRACTURES): Vit D, 25-Hydroxy: 26 ng/mL — ABNORMAL LOW (ref 30–100)

## 2020-05-01 ENCOUNTER — Encounter: Payer: Self-pay | Admitting: Nurse Practitioner

## 2020-05-01 ENCOUNTER — Ambulatory Visit (INDEPENDENT_AMBULATORY_CARE_PROVIDER_SITE_OTHER): Payer: Medicare PPO | Admitting: Nurse Practitioner

## 2020-05-01 ENCOUNTER — Other Ambulatory Visit: Payer: Self-pay

## 2020-05-01 VITALS — BP 115/63 | HR 81 | Ht 67.0 in | Wt 234.0 lb

## 2020-05-01 DIAGNOSIS — IMO0002 Reserved for concepts with insufficient information to code with codable children: Secondary | ICD-10-CM

## 2020-05-01 DIAGNOSIS — E559 Vitamin D deficiency, unspecified: Secondary | ICD-10-CM

## 2020-05-01 DIAGNOSIS — Z794 Long term (current) use of insulin: Secondary | ICD-10-CM | POA: Diagnosis not present

## 2020-05-01 DIAGNOSIS — E1165 Type 2 diabetes mellitus with hyperglycemia: Secondary | ICD-10-CM | POA: Diagnosis not present

## 2020-05-01 DIAGNOSIS — E118 Type 2 diabetes mellitus with unspecified complications: Secondary | ICD-10-CM

## 2020-05-01 DIAGNOSIS — E782 Mixed hyperlipidemia: Secondary | ICD-10-CM | POA: Diagnosis not present

## 2020-05-01 DIAGNOSIS — I10 Essential (primary) hypertension: Secondary | ICD-10-CM

## 2020-05-01 LAB — POCT UA - MICROALBUMIN: Microalbumin Ur, POC: 10 mg/L

## 2020-05-01 LAB — POCT GLYCOSYLATED HEMOGLOBIN (HGB A1C): HbA1c, POC (controlled diabetic range): 7.1 % — AB (ref 0.0–7.0)

## 2020-05-01 NOTE — Patient Instructions (Signed)

## 2020-05-01 NOTE — Progress Notes (Signed)
05/01/2020            Endocrinology follow-up note    Subjective:    Patient ID: Amanda Hopkins, female    DOB: [birth year], PCP Allwardt, Yetta Flock, PA   Past Medical History:  Diagnosis Date  . Arthritis   . Diabetes mellitus without complication ([place])   . Hyperlipidemia   . Hypertension   . Obesity   . Palpitations   . PONV (postoperative nausea and vomiting)   . Reflux esophagitis    Past Surgical History:  Procedure Laterality Date  . ABDOMINAL HYSTERECTOMY    . APPENDECTOMY    . CATARACT EXTRACTION Left   . CATARACT EXTRACTION W/PHACO Right 11/18/2013   Procedure: CATARACT EXTRACTION PHACO AND INTRAOCULAR LENS PLACEMENT (IOC);  Surgeon: Tonny Branch, MD;  Location: [facility];  Service: Ophthalmology;  Laterality: Right;  CDE:  7.76  . CESAREAN SECTION     x2  . CHEST WALL BIOPSY Right    removal of fatty tumor  . COLONOSCOPY  11/08/2004   [id number] rectosigmoid polyp/left side diverticula  . COLONOSCOPY  12/2009   RMR: left-sided diverticula, long tortuous colon  . COLONOSCOPY  2003   RMR: carcinoma in situ  . COLONOSCOPY N/A 04/07/2015   Procedure: COLONOSCOPY;  Surgeon: Daneil Dolin, MD;  Location: [facility];  Service: Endoscopy;  Laterality: N/A;  1100 - moved to 11/8 @ 11:30 - office to notify   Social History   Socioeconomic History  . Marital status: Married    Spouse name: Not on file  . Number of children: 2  . Years of education: Not on file  . Highest education level: Not on file  Occupational History  . Not on file  Tobacco Use  . Smoking status: Never Smoker  . Smokeless tobacco: Never Used  Vaping Use  . Vaping Use: Never used  Substance and Sexual Activity  . Alcohol use: No  . Drug use: No  . Sexual activity: Yes    Birth control/protection: Surgical  Other Topics Concern  . Not on file  Social History Narrative  . Not on file   Social Determinants of Health   Financial Resource Strain:   . Difficulty of Paying Living  Expenses: Not on file  Food Insecurity:   . Worried About Charity fundraiser in the Last Year: Not on file  . Ran Out of Food in the Last Year: Not on file  Transportation Needs:   . Lack of Transportation (Medical): Not on file  . Lack of Transportation (Non-Medical): Not on file  Physical Activity:   . Days of Exercise per Week: Not on file  . Minutes of Exercise per Session: Not on file  Stress:   . Feeling of Stress : Not on file  Social Connections:   . Frequency of Communication with Friends and Family: Not on file  . Frequency of Social Gatherings with Friends and Family: Not on file  . Attends Religious Services: Not on file  . Active Member of Clubs or Organizations: Not on file  . Attends Archivist Meetings: Not on file  . Marital Status: Not on file   Outpatient Encounter Medications as of 05/01/2020  Medication Sig  . Accu-Chek Softclix Lancets lancets USE 1  TO CHECK GLUCOSE TWICE DAILY  . B-D UF III MINI PEN NEEDLES 31G X 5 MM MISC 1 each by Other route daily.  . Blood Glucose Monitoring Suppl (ACCU-CHEK GUIDE) w/Device KIT 1 each by Does  not apply route 2 (two) times daily. Use as directed to check blood glucose two times daily.  . Dulaglutide (TRULICITY) 1.5 OV/2.9VB SOPN Inject 1.5 mg into the skin once a week.  Marland Kitchen glucose blood test strip 1 each by Other route 2 (two) times daily. Use as instructed to test blood glucose two times daily ACCU-CHEK GUIDE TEST STRIPS  . ibuprofen (ADVIL,MOTRIN) 200 MG tablet Take 200 mg by mouth every 6 (six) hours as needed.  . insulin degludec (TRESIBA FLEXTOUCH) 100 UNIT/ML FlexTouch Pen Inject 0.4 mLs (40 Units total) into the skin at bedtime.  Marland Kitchen loratadine (CLARITIN) 10 MG tablet Take 10 mg by mouth daily as needed for allergies.  Marland Kitchen losartan (COZAAR) 25 MG tablet Take 25 mg by mouth at bedtime.   . rosuvastatin (CRESTOR) 10 MG tablet Take 10 mg by mouth at bedtime.   Marland Kitchen glipiZIDE (GLUCOTROL XL) 2.5 MG 24 hr tablet Take 1  tablet (2.5 mg total) by mouth daily with breakfast. (Patient not taking: Reported on 05/01/2020)   No facility-administered encounter medications on file as of 05/01/2020.   ALLERGIES: Allergies  Allergen Reactions  . Erythromycin   . Ivp Dye [Iodinated Diagnostic Agents]     Red and itchy   VACCINATION STATUS:  There is no immunization history on file for this patient.  Diabetes She presents for her follow-up diabetic visit. She has type 2 diabetes mellitus. Onset time: She was diagnosed at approximate age of 72 years. Her disease course has been stable. There are no hypoglycemic associated symptoms. Pertinent negatives for hypoglycemia include no confusion, headaches, pallor or seizures. There are no diabetic associated symptoms. Pertinent negatives for diabetes include no chest pain, no polydipsia, no polyphagia, no polyuria and no weight loss. There are no hypoglycemic complications. Symptoms are stable. There are no diabetic complications. Risk factors for coronary artery disease include diabetes mellitus, dyslipidemia, hypertension, obesity and sedentary lifestyle. Current diabetic treatment includes insulin injections and oral agent (monotherapy). She is compliant with treatment most of the time. Her weight is stable. She is following a diabetic and generally healthy diet. When asked about meal planning, she reported none. She has had a previous visit with a dietitian. She rarely participates in exercise. Her home blood glucose trend is fluctuating minimally. Her breakfast blood glucose range is generally 90-110 mg/dl. (She presents today with her meter and logs showing at target fasting glycemic profile.  Her POCT A1c today is 7.1%, increasing slightly from last visit of 6.9% since we stopped her Glipizide for hypoglycemia.  She denies any significant hypoglycemia.) An ACE inhibitor/angiotensin II receptor blocker is being taken. She does not see a podiatrist.Eye exam is current.   Hyperlipidemia This is a chronic problem. The current episode started more than 1 year ago. The problem is controlled. Recent lipid tests were reviewed and are normal. Exacerbating diseases include chronic renal disease, diabetes and obesity. There are no known factors aggravating her hyperlipidemia. Pertinent negatives include no chest pain, myalgias or shortness of breath. Current antihyperlipidemic treatment includes statins. The current treatment provides moderate improvement of lipids. There are no compliance problems.  Risk factors for coronary artery disease include dyslipidemia, diabetes mellitus, hypertension, obesity, a sedentary lifestyle and post-menopausal.  Hypertension This is a chronic problem. The current episode started more than 1 year ago. The problem has been gradually improving since onset. The problem is controlled. Pertinent negatives include no chest pain, headaches, palpitations or shortness of breath. There are no associated agents to hypertension. Risk factors  for coronary artery disease include dyslipidemia, diabetes mellitus, post-menopausal state and sedentary lifestyle. Past treatments include angiotensin blockers. The current treatment provides moderate improvement. There are no compliance problems.  Identifiable causes of hypertension include chronic renal disease.    Review of systems  Constitutional: + Minimally fluctuating body weight,  current Body mass index is 36.65 kg/m. , no fatigue, no subjective hyperthermia, no subjective hypothermia Eyes: no blurry vision, no xerophthalmia ENT: no sore throat, no nodules palpated in throat, no dysphagia/odynophagia, no hoarseness Cardiovascular: no chest pain, no shortness of breath, no palpitations, + leg swelling Respiratory: no cough, no shortness of breath Gastrointestinal: no nausea/vomiting/diarrhea Musculoskeletal: no muscle/joint aches Skin: no rashes, no hyperemia Neurological: no tremors, no numbness, no  tingling, no dizziness Psychiatric: no depression, no anxiety  Objective:    BP 115/63 (BP Location: Right Arm)   Pulse 81   Ht _0  (1.702 m)   Wt 234 lb (106.1 kg)   BMI 36.65 kg/m   Wt Readings from Last 3 Encounters:  05/01/20 234 lb (106.1 kg)  12/31/19 232 lb 6.4 oz (105.4 kg)  08/05/19 232 lb 6.4 oz (105.4 kg)    BP Readings from Last 3 Encounters:  05/01/20 115/63  12/31/19 112/76  08/05/19 121/76    Physical Exam- Limited  Constitutional:  Body mass index is 36.65 kg/m. , not in acute distress, normal state of mind Eyes:  EOMI, no exophthalmos Neck: Supple Cardiovascular: RRR, no murmers, rubs, or gallops, 1+ pitting edema to BLE Respiratory: Adequate breathing efforts, no crackles, rales, rhonchi, or wheezing Musculoskeletal: no gross deformities, strength intact in all four extremities, no gross restriction of joint movements Skin:  no rashes, no hyperemia Neurological: no tremor with outstretched hands   POCT ABI Results 05/01/20   Right ABI:  1.34      Left ABI:  1.32  Right leg systolic / diastolic: 202/54 mmHg Left leg systolic / diastolic: 270/62 mmHg  Arm systolic / diastolic: 376/28 mmHG  Detailed report will be scanned into patient chart.   Foot exam:   No rashes, ulcers, cuts, calluses, onychodystrophy.   Good pulses bilat.  Good sensation to 10 g monofilament bilat.  Varicose veins noted to medial ankle and dorsal foot R>L     CMP     Component Value Date/Time   NA 144 04/27/2020 1006   NA 144 03/27/2019 0933   K 4.2 04/27/2020 1006   K 3.8 03/27/2019 0933   CL 110 04/27/2020 1006   CL 108 03/27/2019 0933   CO2 29 04/27/2020 1006   CO2 29 03/27/2019 0933   GLUCOSE 194 (H) 04/27/2020 1006   BUN 12 04/27/2020 1006   BUN 15 11/04/2019 0000   CREATININE 0.74 04/27/2020 1006   CALCIUM 9.3 04/27/2020 1006   CALCIUM 9.6 03/27/2019 0933   PROT 5.9 (L) 04/27/2020 1006   PROT 5.9 (A) 03/27/2019 0933   ALBUMIN 4.2 11/04/2019  0000   ALBUMIN 4.2 03/27/2019 0933   AST 19 04/27/2020 1006   AST 19 03/27/2019 0933   ALT 24 04/27/2020 1006   ALT 26 03/27/2019 0933   ALKPHOS 62 03/27/2019 0933   BILITOT 0.6 04/27/2020 1006   BILITOT 0.5 03/27/2019 0933   GFRNONAA 79 04/27/2020 1006   GFRAA 92 04/27/2020 1006    Diabetic Labs (most recent): Lab Results  Component Value Date   HGBA1C 7.1 (A) 05/01/2020   HGBA1C 6.9 11/04/2019   HGBA1C 7.4 (A) 08/05/2019   Lipid Panel  Component Value Date/Time   CHOL 157 04/27/2020 1006   CHOL 166 03/27/2019 0933   TRIG 144 04/27/2020 1006   TRIG 183 (A) 03/27/2019 0933   HDL 55 04/27/2020 1006   HDL 43 03/27/2019 0933   CHOLHDL 2.9 04/27/2020 1006   VLDL 22 05/12/2016 0917   LDLCALC 78 04/27/2020 1006    Assessment & Plan:   1. Uncontrolled type 2 diabetes mellitus with complication, with long-term current use of insulin ([place])  - She remains at a high risk for more acute and chronic complications of diabetes which include CAD, CVA, CKD, retinopathy, and neuropathy. These are all discussed in detail with the patient.  She presents today with her meter and logs showing at target fasting glycemic profile.  Her POCT A1c today is 7.1%, increasing slightly from last visit of 6.9% since we stopped her Glipizide for hypoglycemia.  She denies any significant hypoglycemia.  Recent labs reviewed.  - Nutritional counseling repeated at each appointment due to patients tendency to fall back in to old habits.  - The patient admits there is a room for improvement in their diet and drink choices. -  Suggestion is made for the patient to avoid simple carbohydrates from their diet including Cakes, Sweet Desserts / Pastries, Ice Cream, Soda (diet and regular), Sweet Tea, Candies, Chips, Cookies, Sweet Pastries,  Store Bought Juices, Alcohol in Excess of  1-2 drinks a day, Artificial Sweeteners, Coffee Creamer, and "Sugar-free" Products. This will help patient to have stable blood  glucose profile and potentially avoid unintended weight gain.   - I encouraged the patient to switch to  unprocessed or minimally processed complex starch and increased protein intake (animal or plant source), fruits, and vegetables.   - Patient is advised to stick to a routine mealtimes to eat 3 meals  a day and avoid unnecessary snacks ( to snack only to correct hypoglycemia).  - I have approached patient with the following individualized plan to manage diabetes and patient agrees.  -Based on her tight fasting glycemic profile, I have advised her to stop the glipizide 2.5 mg XL for now.  She is to continue Tresiba 40 units SQ nightly, and Trulicity 1.5 SQ weekly.  -She is encouraged to continue monitoring blood glucose twice daily, before breakfast and before bed, and to call the clinic if she has readings less than 70 or greater than 200 for 3 tests in a row.  -She has history of intolerance to metformin.   - Patient specific target  for A1c; LDL, HDL, Triglycerides, and  Waist Circumference were discussed in detail.  2) BP/HTN:  Her blood pressure is controlled to target she is encouraged to continue Losartan 25 mg p.o. daily at bedtime.  3) Lipids/HPL:  Her most recent lipid panel from 04/27/20 shows controlled LDL at 78.  She is advised to continue Crestor 10 mg po daily at bedtime.  Side effects and precautions discussed with her.  4)  Weight/Diet:  Patient does not feel like she would benefit from a CDE consult.  Exercise, and carbohydrates information provided.  5) Vitamin D deficiency: Her most recent vitamin D level on 04/27/20 was 26.  She will benefit from restarting Vitamin D3 000 units daily during the colder months.  6) Chronic Care/Health Maintenance:  -Patient is on ACEI/ARB and Statin medications and encouraged to continue to follow up with Ophthalmology, Podiatrist at least yearly or according to recommendations, and advised to  stay away from smoking. I have  recommended yearly  flu vaccine and pneumonia vaccination at least every 5 years; moderate intensity exercise for up to 150 minutes weekly; and  sleep for at least 7 hours a day.  - I advised patient to maintain close follow up with Allwardt, Alyssa, PA for primary care needs.  - Time spent on this patient care encounter:  35 min, of which > 50% was spent in  counseling and the rest reviewing her blood glucose logs , discussing her hypoglycemia and hyperglycemia episodes, reviewing her current and  previous labs / studies  ( including abstraction from other facilities) and medications  doses and developing a  long term treatment plan and documenting her care.   Please refer to Patient Instructions for Blood Glucose Monitoring and Insulin/Medications Dosing Guide"  in media tab for additional information. Please  also refer to " Patient Self Inventory" in the Media  tab for reviewed elements of pertinent patient history.  Amanda Hopkins participated in the discussions, expressed understanding, and voiced agreement with the above plans.  All questions were answered to her satisfaction. she is encouraged to contact clinic should she have any questions or concerns prior to her return visit.    Follow up plan: -Return in about 4 months (around 08/30/2020) for Diabetes follow up with A1c in office, No previsit labs, Bring glucometer and logs.  Rayetta Pigg, [facility] [address] [place], [place] [address] Phone: [phone] Fax: [phone]   05/01/2020, 10:47 AM

## 2020-05-05 DIAGNOSIS — M25562 Pain in left knee: Secondary | ICD-10-CM | POA: Diagnosis not present

## 2020-05-12 DIAGNOSIS — M25562 Pain in left knee: Secondary | ICD-10-CM | POA: Diagnosis not present

## 2020-05-12 DIAGNOSIS — Z961 Presence of intraocular lens: Secondary | ICD-10-CM | POA: Diagnosis not present

## 2020-05-12 DIAGNOSIS — H5213 Myopia, bilateral: Secondary | ICD-10-CM | POA: Diagnosis not present

## 2020-05-14 NOTE — Progress Notes (Signed)
Primary Care Physician:  Allwardt, Alyssa, PA Primary Gastroenterologist:  Dr. Gala Romney  Chief Complaint  Patient presents with  . Colonoscopy    HPI:   Amanda Hopkins is a 74 y.o. female presenting today to discuss scheduling surveillance colonoscopy. 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 Last colonoscopy in November 2016 with one 6 mm polyp and one 4 mm polyp, scattered left-sided diverticula, redundant colon.  Pathology with benign colonic mucosa.  Recommended repeat colonoscopy in 5 years.  Family history significant for 3 paternal cousins with colon cancer and father with colon polyps.  She has been advised that her children should have screening colonoscopy starting at age 34 with repeat every 5 years.  Today:  No GI concerns. No abdominal pain. Occasional constipation. Tries to manage this with increasing fiber intake. BMs typically 4 times a week. Stools can be soft. No straining usually. No diarrhea. No blood in the stool or black stools.  No unintentional weight loss.   No nausea or vomiting. No GERD symptoms or dysphagia.   States after her last procedure, she felt poorly thereafter for quite some time.  Unable to explain how she felt but states, "I just did not feel right."  Query whether this was secondary to lingering sedation effects.  Past Medical History:  Diagnosis Date  . Arthritis   . Diabetes mellitus without complication ([place])   . Hyperlipidemia   . Hypertension    patient denies. States she is on losartan for kidney protection.   . Obesity   . Palpitations   . PONV (postoperative nausea and vomiting)   . Reflux esophagitis     Past Surgical History:  Procedure Laterality Date  . ABDOMINAL HYSTERECTOMY    . APPENDECTOMY    . CATARACT EXTRACTION Left   . CATARACT EXTRACTION W/PHACO Right 11/18/2013   Procedure: CATARACT EXTRACTION PHACO AND INTRAOCULAR LENS PLACEMENT (IOC);  Surgeon: Tonny Branch, MD;  Location: [facility];  Service: Ophthalmology;  Laterality: Right;  CDE:  7.76  . CESAREAN SECTION     x2  . CHEST WALL BIOPSY Right    removal of fatty tumor  . COLONOSCOPY  11/08/2004   [id number] rectosigmoid polyp/left side diverticula  . COLONOSCOPY  12/2009   RMR: left-sided diverticula, long tortuous colon  . COLONOSCOPY  2003   RMR: carcinoma in situ  . COLONOSCOPY N/A 04/07/2015   Procedure: COLONOSCOPY;  Surgeon: Daneil Dolin, MD;  Location: [facility];  Service: Endoscopy;  Laterality: N/A;  1100 - moved to 11/8 @ 11:30 - office to notify    Current Outpatient Medications  Medication Sig Dispense Refill  . Accu-Chek Softclix Lancets lancets USE 1  TO CHECK GLUCOSE TWICE DAILY 100 each 0  . B-D UF III MINI PEN NEEDLES 31G X 5 MM MISC 1 each by Other route daily. 100 each 2  . Blood Glucose Monitoring Suppl (ACCU-CHEK GUIDE) w/Device KIT 1 each by Does not apply route 2 (two) times daily. Use as directed to check blood glucose two times daily. 1 kit 0  . Dulaglutide (TRULICITY) 1.5 JM/4.2AS SOPN Inject 1.5 mg into the skin once a week. 6 mL 0  . glucose blood test strip 1 each by Other route 2 (two) times daily. Use as instructed to test blood glucose two times daily ACCU-CHEK GUIDE TEST STRIPS 200 each 0  . ibuprofen (ADVIL,MOTRIN) 200 MG tablet Take 200 mg by mouth as needed.    Marland Kitchen  insulin degludec (TRESIBA FLEXTOUCH) 100 UNIT/ML FlexTouch Pen Inject 0.4 mLs (40 Units total) into the skin at bedtime. 36 mL 0  . loratadine (CLARITIN) 10 MG tablet Take 10 mg by mouth daily as needed for allergies.    Marland Kitchen losartan (COZAAR) 25 MG tablet Take 25 mg by mouth at bedtime.     . rosuvastatin (CRESTOR) 10 MG tablet Take 10 mg by mouth at bedtime.      No current facility-administered medications for this visit.    Allergies as of 05/15/2020 - Review Complete 05/15/2020  Allergen Reaction Noted  . Erythromycin  04/29/2013  . Ivp dye [iodinated diagnostic agents]  12/31/2019    Family History   Problem Relation Age of Onset  . Hypertension Mother   . Colon cancer Cousin        paternal   . Colon cancer Cousin        paternal  . Colon cancer Cousin        paternal  . Colon polyps Father   . Prostate cancer Father   . Breast cancer Maternal Aunt   . Colon polyps Brother        multiple  . Colon polyps Brother     Social History   Socioeconomic History  . Marital status: Married    Spouse name: Not on file  . Number of children: 2  . Years of education: Not on file  . Highest education level: Not on file  Occupational History  . Not on file  Tobacco Use  . Smoking status: Never Smoker  . Smokeless tobacco: Never Used  Vaping Use  . Vaping Use: Never used  Substance and Sexual Activity  . Alcohol use: No  . Drug use: No  . Sexual activity: Yes    Birth control/protection: Surgical  Other Topics Concern  . Not on file  Social History Narrative  . Not on file   Social Determinants of Health   Financial Resource Strain: Not on file  Food Insecurity: Not on file  Transportation Needs: Not on file  Physical Activity: Not on file  Stress: Not on file  Social Connections: Not on file  Intimate Partner Violence: Not on file    Review of Systems: Gen: Denies any fever, chills, cold or flulike symptoms, lightheadedness, dizziness, presyncope, syncope. CV: Denies chest pain or heart palpitations Resp: Denies shortness of breath cough.  GI: See HPI MS: Reports left knee pain. Derm: Denies rash Heme: See HPI  Physical Exam: BP 130/74   Pulse 84   Temp (!) 96.9 F (36.1 C) (Temporal)   Ht 5' 7" (1.702 m)   Wt 234 lb 3.2 oz (106.2 kg)   BMI 36.68 kg/m  General:   Alert and oriented. Pleasant and cooperative. Well-nourished and well-developed.  Head:  Normocephalic and atraumatic. Eyes:  Without icterus, sclera clear and conjunctiva pink.  Ears:  Normal auditory acuity. Lungs:  Clear to auscultation bilaterally. No wheezes, rales, or rhonchi. No  distress.  Heart:  S1, S2 present without murmurs appreciated.  Abdomen:  +BS, soft, non-tender and non-distended. No HSM noted. No guarding or rebound. No masses appreciated.  Rectal:  Deferred  Msk:  Symmetrical without gross deformities. Normal posture. Extremities:  With 1+ bilateral LE pitting edema.  Neurologic:  Alert and  oriented x4;  grossly normal neurologically. Skin:  Intact without significant lesions or rashes. Psych: Normal mood and affect.

## 2020-05-15 ENCOUNTER — Ambulatory Visit (INDEPENDENT_AMBULATORY_CARE_PROVIDER_SITE_OTHER): Payer: Medicare PPO | Admitting: Gastroenterology

## 2020-05-15 ENCOUNTER — Other Ambulatory Visit: Payer: Self-pay

## 2020-05-15 ENCOUNTER — Encounter: Payer: Self-pay | Admitting: Gastroenterology

## 2020-05-15 VITALS — BP 130/74 | HR 84 | Temp 96.9°F | Ht 67.0 in | Wt 234.2 lb

## 2020-05-15 DIAGNOSIS — D010 Carcinoma in situ of colon: Secondary | ICD-10-CM | POA: Diagnosis not present

## 2020-05-15 NOTE — Progress Notes (Signed)
CC'ED TO PCP 

## 2020-05-15 NOTE — Patient Instructions (Addendum)
We will get you scheduled for a colonoscopy in the near future with Dr. Gala Romney. 1 day prior to your procedure: Take one half dose of Tresiba (20 units) at bedtime. Day of procedure: Do not take any diabetes medications the morning of your procedure.  Be sure to check your blood sugars frequently while you are on clear liquids.  Correct any low blood sugars with approved sugary clear liquids.  For occasional constipation, I recommend you start Benefiber 1 tablespoon daily x3 weeks then increase to 2 tablespoons daily thereafter.  We will plan to see you back as needed.  Do not hesitate to call if you have any new GI concerns.  It was nice meeting you today!  I hope you have a great Christmas!  Aliene Altes, PA-C [facility]

## 2020-05-15 NOTE — Assessment & Plan Note (Addendum)
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  Last colonoscopy in November 2016 with one 6 mm polyp and one 4 mm polyp, scattered left-sided diverticula, redundant colon.  Pathology with benign colonic mucosa.  Recommended repeat colonoscopy in 5 years.  She has no significant upper or lower GI symptoms.  Mild constipation that she manages with diet.  No alarm symptoms.  Family history significant for 3 paternal cousins with colon cancer, father and 2 brothers with colon polyps.  I have offered her repeat colonoscopy.  She does report after her last colonoscopy, she felt poorly for quite some time.  She is not able to explain how she felt, but states," I just did not feel right".  Query whether this was secondary to lingering sedation effects.  Will opt to use propofol for sedation moving forward.  Plan: Proceed with colonoscopy with propofol with Dr. Gala Romney in the near future. The risks, benefits, and alternatives have been discussed with the patient in detail. The patient states understanding and desires to proceed.  ASA II See separate instructions for diabetes medication adjustments. Start Benefiber for mild constipation. Follow-up as needed.

## 2020-05-19 DIAGNOSIS — M25562 Pain in left knee: Secondary | ICD-10-CM | POA: Diagnosis not present

## 2020-06-24 ENCOUNTER — Other Ambulatory Visit: Payer: Self-pay | Admitting: "Endocrinology

## 2020-06-24 ENCOUNTER — Telehealth: Payer: Self-pay | Admitting: *Deleted

## 2020-06-24 DIAGNOSIS — E1165 Type 2 diabetes mellitus with hyperglycemia: Secondary | ICD-10-CM

## 2020-06-24 DIAGNOSIS — IMO0002 Reserved for concepts with insufficient information to code with codable children: Secondary | ICD-10-CM

## 2020-06-24 NOTE — Telephone Encounter (Signed)
Called pt. She has been scheduled for TCS w/ propofol, Dr. Gala Romney, ASA 2 3/3 at 7:30am. Patient aware will need covid test prior and advised will mail this to her. She will receive call from hospital with time of arrival day for procedure. Confirmed mailing address.

## 2020-07-03 DIAGNOSIS — M25562 Pain in left knee: Secondary | ICD-10-CM | POA: Diagnosis not present

## 2020-07-03 DIAGNOSIS — G4700 Insomnia, unspecified: Secondary | ICD-10-CM | POA: Diagnosis not present

## 2020-07-03 DIAGNOSIS — E119 Type 2 diabetes mellitus without complications: Secondary | ICD-10-CM | POA: Diagnosis not present

## 2020-07-03 DIAGNOSIS — I10 Essential (primary) hypertension: Secondary | ICD-10-CM | POA: Diagnosis not present

## 2020-07-03 DIAGNOSIS — Z6836 Body mass index (BMI) 36.0-36.9, adult: Secondary | ICD-10-CM | POA: Diagnosis not present

## 2020-07-03 DIAGNOSIS — R0981 Nasal congestion: Secondary | ICD-10-CM | POA: Diagnosis not present

## 2020-07-14 DIAGNOSIS — L570 Actinic keratosis: Secondary | ICD-10-CM | POA: Diagnosis not present

## 2020-07-14 DIAGNOSIS — D485 Neoplasm of uncertain behavior of skin: Secondary | ICD-10-CM | POA: Diagnosis not present

## 2020-07-14 DIAGNOSIS — L720 Epidermal cyst: Secondary | ICD-10-CM | POA: Diagnosis not present

## 2020-07-27 ENCOUNTER — Telehealth: Payer: Self-pay

## 2020-07-27 ENCOUNTER — Other Ambulatory Visit: Payer: Self-pay

## 2020-07-27 NOTE — Telephone Encounter (Signed)
Pt called and wants to r/s her apt due to being exposed to Covid. Pt doesn't want to complete her prep and test positive. Please call pt HM- [phone], Cell [phone]

## 2020-07-27 NOTE — Telephone Encounter (Signed)
Called pt, TCS rescheduled to 09/03/20--AM (diabetic). COVID test rescheduled to 09/01/20 at 11:00am. Endo scheduler informed. New appt letter and procedure instructions mailed.  PA for TCS submitted via [employer] website. [employer]# [id number], valid 09/03/20-10/03/20.

## 2020-07-28 ENCOUNTER — Other Ambulatory Visit (HOSPITAL_COMMUNITY): Admission: RE | Admit: 2020-07-28 | Payer: Medicare PPO | Source: Ambulatory Visit

## 2020-08-01 ENCOUNTER — Other Ambulatory Visit: Payer: Self-pay | Admitting: Nurse Practitioner

## 2020-08-31 ENCOUNTER — Ambulatory Visit: Payer: Medicare PPO | Admitting: Nurse Practitioner

## 2020-09-01 ENCOUNTER — Other Ambulatory Visit: Payer: Self-pay

## 2020-09-01 ENCOUNTER — Other Ambulatory Visit (HOSPITAL_COMMUNITY)
Admission: RE | Admit: 2020-09-01 | Discharge: 2020-09-01 | Disposition: A | Discharge status: Home / Self Care | Payer: Medicare PPO | Source: Ambulatory Visit | Attending: Internal Medicine | Admitting: Internal Medicine

## 2020-09-01 ENCOUNTER — Other Ambulatory Visit (HOSPITAL_COMMUNITY): Payer: Medicare PPO

## 2020-09-01 DIAGNOSIS — Z20822 Contact with and (suspected) exposure to covid-19: Secondary | ICD-10-CM | POA: Insufficient documentation

## 2020-09-01 DIAGNOSIS — Z01812 Encounter for preprocedural laboratory examination: Secondary | ICD-10-CM | POA: Diagnosis not present

## 2020-09-01 LAB — BASIC METABOLIC PANEL
Anion gap: 10 (ref 5–15)
BUN: 13 mg/dL (ref 8–23)
CO2: 25 mmol/L (ref 22–32)
Calcium: 9.2 mg/dL (ref 8.9–10.3)
Chloride: 108 mmol/L (ref 98–111)
Creatinine, Ser: 0.84 mg/dL (ref 0.44–1.00)
GFR, Estimated: >60 mL/min (ref >60)
Glucose, Bld: 190 mg/dL — ABNORMAL HIGH (ref 70–99)
Potassium: 4.1 mmol/L (ref 3.5–5.1)
Sodium: 143 mmol/L (ref 135–145)

## 2020-09-02 LAB — SARS CORONAVIRUS 2 (TAT 6-24 HRS): SARS Coronavirus 2: NEGATIVE

## 2020-09-03 ENCOUNTER — Telehealth: Payer: Self-pay | Admitting: *Deleted

## 2020-09-03 ENCOUNTER — Ambulatory Visit (HOSPITAL_COMMUNITY): Admission: RE | Admit: 2020-09-03 | Payer: Medicare PPO | Source: Home / Self Care | Admitting: Internal Medicine

## 2020-09-03 ENCOUNTER — Encounter (HOSPITAL_COMMUNITY): Admission: RE | Payer: Self-pay | Source: Home / Self Care

## 2020-09-03 SURGERY — COLONOSCOPY WITH PROPOFOL
Anesthesia: Monitor Anesthesia Care

## 2020-09-03 NOTE — Telephone Encounter (Signed)
Received a call from endo. Patient was still passing solid stool this morning and it had blood mixed in it. Patient was scheduled for TCS today with Dr. Gala Romney. Per Dr. Gala Romney patient needs OV to be evaluated.   Called pt and appt scheduled with AB 4/14 at 8:30am

## 2020-09-03 NOTE — OR Nursing (Signed)
Patient called and stated after taking miralax prep, she is still passing solid stool. Dr. Gala Romney cancelled procedure for today and said have patient be seen in office to get rescheduled. Called patient back and informed her of the above. Patient stated, "I think this is blood." Informed patient I would have office call her ASAP. Patient stated she is not having colonoscopy due to rectal bleeding. Dr.Rourk notified of the above. Will notify office when they open.

## 2020-09-10 ENCOUNTER — Encounter: Payer: Self-pay | Admitting: Gastroenterology

## 2020-09-10 ENCOUNTER — Other Ambulatory Visit: Payer: Self-pay

## 2020-09-10 ENCOUNTER — Ambulatory Visit: Payer: Medicare PPO | Admitting: Gastroenterology

## 2020-09-10 VITALS — BP 105/57 | HR 81 | Temp 97.5°F | Ht 67.0 in | Wt 232.0 lb

## 2020-09-10 DIAGNOSIS — D010 Carcinoma in situ of colon: Secondary | ICD-10-CM | POA: Diagnosis not present

## 2020-09-10 DIAGNOSIS — K5900 Constipation, unspecified: Secondary | ICD-10-CM

## 2020-09-10 NOTE — Progress Notes (Signed)
Referring Provider: Allwardt, Randa Evens, PA-C Primary Care Physician:  Wanita Chamberlain, PA-C Primary GI: Dr. Gala Romney   Chief Complaint  Patient presents with  . Colonoscopy    Didn't get cleaned out last time. Possible blood in stool     HPI:   Amanda Hopkins is a 75 y.o. 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 Last colonoscopy in November 2016 with one 6 mm polyp and one 4 mm polyp, scattered left-sided diverticula, redundant colon.  Pathology with benign colonic mucosa.  Recommended repeat colonoscopy in 5 years. Family history significant for 3 paternal cousins with colon cancer and father with colon polyps. Colonoscopy planned earlier this month but did not prep appropriately, arriving to endo with passing solid stool. Miralax prep was attempted and not successful.    BMs not as frequent as she feels they should be. Going every other day. Hasn't been taking fiber. No abdominal pain. Rare straining. Had started meloxicam in December and feels bowels more sluggish since then. No obvious GI bleeding and feels maybe it was something she ate. No unexplained weight loss or lack of appetite. No dysphagia.   Past Medical History:  Diagnosis Date  . Arthritis   . Diabetes mellitus without complication ([place])   . Hyperlipidemia   . Hypertension    patient denies. States she is on losartan for kidney protection.   . Obesity   . Palpitations   . PONV (postoperative nausea and vomiting)   . Reflux esophagitis     Past Surgical History:  Procedure Laterality Date  . ABDOMINAL HYSTERECTOMY    . APPENDECTOMY    . CATARACT EXTRACTION Left   . CATARACT EXTRACTION W/PHACO Right 11/18/2013   Procedure: CATARACT EXTRACTION PHACO AND INTRAOCULAR LENS PLACEMENT (IOC);  Surgeon: Tonny Branch, MD;  Location: [facility];  Service: Ophthalmology;  Laterality: Right;  CDE:  7.76  . CESAREAN SECTION     x2  . CHEST WALL BIOPSY Right    removal  of fatty tumor  . COLONOSCOPY  11/08/2004   [id number] rectosigmoid polyp/left side diverticula  . COLONOSCOPY  12/2009   RMR: left-sided diverticula, long tortuous colon  . COLONOSCOPY  2003   RMR: carcinoma in situ  . COLONOSCOPY N/A 04/07/2015    one 6 mm polyp and one 4 mm polyp, scattered left-sided diverticula, redundant colon.  Pathology with benign colonic mucosa.  Recommended repeat colonoscopy in 5 years    Current Outpatient Medications  Medication Sig Dispense Refill  . acetaminophen (TYLENOL) 325 MG tablet Take 650 mg by mouth every 6 (six) hours as needed for moderate pain.    . Cholecalciferol (DIALYVITE VITAMIN D 5000) 125 MCG (5000 UT) capsule Take 5,000 Units by mouth daily.    . Dulaglutide (TRULICITY) 1.5 QV/9.5GL SOPN Inject 1.5 mg into the skin once a week. (Patient taking differently: Inject 1.5 mg into the skin every Monday.) 6 mL 0  . ipratropium (ATROVENT) 0.03 % nasal spray Place 2 sprays into both nostrils 2 (two) times daily as needed for allergies.    Marland Kitchen loratadine (CLARITIN) 10 MG tablet Take 10 mg by mouth daily as needed for allergies.    Marland Kitchen losartan (COZAAR) 25 MG tablet Take 25 mg by mouth at bedtime.     . meloxicam (MOBIC) 15 MG tablet Take 15 mg by mouth daily.    . rosuvastatin (CRESTOR) 10 MG tablet Take 10 mg by mouth at bedtime.     Marland Kitchen  TRESIBA FLEXTOUCH 100 UNIT/ML FlexTouch Pen INJECT 40 UNITS INTO THE SKIN AT BEDTIME (Patient taking differently: Inject 40 Units into the skin at bedtime.) 36 mL 0  . Accu-Chek Softclix Lancets lancets USE 1 TO CHECK GLUCOSE TWICE DAILY 100 each 0  . B-D UF III MINI PEN NEEDLES 31G X 5 MM MISC 1 each by Other route daily. 100 each 2  . Blood Glucose Monitoring Suppl (ACCU-CHEK GUIDE) w/Device KIT 1 each by Does not apply route 2 (two) times daily. Use as directed to check blood glucose two times daily. 1 kit 0  . glucose blood test strip 1 each by Other route 2 (two) times daily. Use as instructed to test blood glucose  two times daily ACCU-CHEK GUIDE TEST STRIPS 200 each 0   No current facility-administered medications for this visit.    Allergies as of 09/10/2020 - Review Complete 09/10/2020  Allergen Reaction Noted  . Ivp dye [iodinated diagnostic agents]  12/31/2019  . Erythromycin Palpitations 04/29/2013    Family History  Problem Relation Age of Onset  . Hypertension Mother   . Colon cancer Cousin        paternal   . Colon cancer Cousin        paternal  . Colon cancer Cousin        paternal  . Colon polyps Father   . Prostate cancer Father   . Breast cancer Maternal Aunt   . Colon polyps Brother        multiple  . Colon polyps Brother     Social History   Socioeconomic History  . Marital status: Married    Spouse name: Not on file  . Number of children: 2  . Years of education: Not on file  . Highest education level: Not on file  Occupational History  . Not on file  Tobacco Use  . Smoking status: Never Smoker  . Smokeless tobacco: Never Used  Vaping Use  . Vaping Use: Never used  Substance and Sexual Activity  . Alcohol use: No  . Drug use: No  . Sexual activity: Yes    Birth control/protection: Surgical  Other Topics Concern  . Not on file  Social History Narrative  . Not on file   Social Determinants of Health   Financial Resource Strain: Not on file  Food Insecurity: Not on file  Transportation Needs: Not on file  Physical Activity: Not on file  Stress: Not on file  Social Connections: Not on file    Review of Systems: Gen: Denies fever, chills, anorexia. Denies fatigue, weakness, weight loss.  CV: Denies chest pain, palpitations, syncope, peripheral edema, and claudication. Resp: Denies dyspnea at rest, cough, wheezing, coughing up blood, and pleurisy. GI: see HPI Derm: Denies rash, itching, dry skin Psych: Denies depression, anxiety, memory loss, confusion. No homicidal or suicidal ideation.  Heme: Denies bruising, bleeding, and enlarged lymph  nodes.  Physical Exam: BP (!) 105/57   Pulse 81   Temp (!) 97.5 F (36.4 C) (Temporal)   Ht 5' 7"  (1.702 m)   Wt 232 lb (105.2 kg)   BMI 36.34 kg/m  General:   Alert and oriented. No distress noted. Pleasant and cooperative.  Head:  Normocephalic and atraumatic. Eyes:  Conjuctiva clear without scleral icterus. Mouth:  Mask in place Cardiac: S1 S2 present without murmurs Lungs: clear bilaterally Abdomen:  +BS, soft, non-tender and non-distended. No rebound or guarding. No HSM or masses noted. Msk:  Symmetrical without gross deformities. Normal posture.  Extremities:  Without edema. Neurologic:  Alert and  oriented x4 Psych:  Alert and cooperative. Normal mood and affect.  ASSESSMENT: Amanda Hopkins is a 75 y.o. 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 Last colonoscopy in November 2016 with one 6 mm polyp and one 4 mm polyp, scattered left-sided diverticula, redundant colon.  Pathology with benign colonic mucosa, due for high risk surveillance now.   Unfortunately, she was given a Miralax prep prior to last attempt in early April and did not have good results, passing solid stool up to day of prep. She does note mild constipation and had not been taking fiber as directed.   I feel with just addition of fiber, she will have more productive bowel movements. We will add Benefiber 2 teaspoons daily to TID, and I have also given samples of Linzess 145 mcg to take starting daily 5 days before colonoscopy. She will call if no improvement with Benefiber and may need to start low dose Linzess daily regardless. I have specifically asked that we not use Miralax prep for this upcoming colonoscopy.   PLAN:  Proceed with colonoscopy by Dr. Gala Romney in near future with Propofol: the risks, benefits, and alternatives have been discussed with the patient in detail. The patient states understanding and desires to proceed.  Benefiber 2 teaspoons  daily to TID  Linzess 145 mcg daily starting 5 days prior to colonoscopy  Call if no improvement with Benefiber  Further recommendations to follow  Annitta Needs, PhD, ANP-BC [facility]

## 2020-09-10 NOTE — Patient Instructions (Signed)
I recommend starting Benefiber or equivalent 2 teaspoons each morning in your beverage of choice, increasing to 3 times per day if needed.  Starting 5 days before colonoscopy, take 1 capsule of Linzess 30 minutes before eating breakfast. This may cause some looser/runny stools.  Please call if Benefiber is not helpful in more frequent and productive bowel movements, as we want to ensure your prep is good this time.  Further recommendations to follow!  It was a pleasure to see you today. I want to create trusting relationships with patients to provide genuine, compassionate, and quality care. I value your feedback. If you receive a survey regarding your visit,  I greatly appreciate you taking time to fill this out.   Annitta Needs, PhD, ANP-BC [facility]

## 2020-09-14 ENCOUNTER — Telehealth: Payer: Self-pay | Admitting: *Deleted

## 2020-09-14 MED ORDER — PEG 3350-KCL-NA BICARB-NACL 420 G PO SOLR: ORAL | 0 refills | Status: DC

## 2020-09-14 NOTE — Telephone Encounter (Signed)
Patient returned call. She has been scheduled for 6/23 at 10:00am. Aware will mail prep instructoins with covid test appt. Confirmed pharmacy/address. Patient states she was given sample of linzess at OV for when she did her prep

## 2020-09-14 NOTE — Telephone Encounter (Signed)
PA approved via [employer]. Auth# [id number] DOS 11/19/2020-12/19/2020

## 2020-09-14 NOTE — Telephone Encounter (Signed)
LMOVM to call back to schedule TCS with propofol, ASA 2 Dr. Gala Romney

## 2020-09-21 ENCOUNTER — Encounter: Payer: Self-pay | Admitting: Nurse Practitioner

## 2020-09-21 ENCOUNTER — Ambulatory Visit: Payer: Medicare PPO | Admitting: Nurse Practitioner

## 2020-09-21 ENCOUNTER — Other Ambulatory Visit: Payer: Self-pay

## 2020-09-21 VITALS — BP 116/71 | HR 84 | Ht 67.0 in | Wt 233.8 lb

## 2020-09-21 DIAGNOSIS — E1165 Type 2 diabetes mellitus with hyperglycemia: Secondary | ICD-10-CM

## 2020-09-21 DIAGNOSIS — I10 Essential (primary) hypertension: Secondary | ICD-10-CM | POA: Diagnosis not present

## 2020-09-21 DIAGNOSIS — E118 Type 2 diabetes mellitus with unspecified complications: Secondary | ICD-10-CM

## 2020-09-21 DIAGNOSIS — E559 Vitamin D deficiency, unspecified: Secondary | ICD-10-CM | POA: Diagnosis not present

## 2020-09-21 DIAGNOSIS — Z794 Long term (current) use of insulin: Secondary | ICD-10-CM

## 2020-09-21 DIAGNOSIS — E782 Mixed hyperlipidemia: Secondary | ICD-10-CM

## 2020-09-21 DIAGNOSIS — IMO0002 Reserved for concepts with insufficient information to code with codable children: Secondary | ICD-10-CM

## 2020-09-21 LAB — POCT GLYCOSYLATED HEMOGLOBIN (HGB A1C): HbA1c, POC (controlled diabetic range): 6.9 % (ref 0.0–7.0)

## 2020-09-21 MED ORDER — TRULICITY 1.5 MG/0.5ML ~~LOC~~ SOAJ: 1.5000 mg | SUBCUTANEOUS | 3 refills | Status: DC

## 2020-09-21 MED ORDER — TRESIBA FLEXTOUCH 100 UNIT/ML ~~LOC~~ SOPN: 32.0000 [IU] | PEN_INJECTOR | Freq: Every day | SUBCUTANEOUS | 2 refills | Status: DC

## 2020-09-21 NOTE — Progress Notes (Signed)
09/21/2020            Endocrinology follow-up note    Subjective:    Patient ID: Amanda Hopkins, female    DOB: 1945-09-20, PCP No primary care provider on file.   Past Medical History:  Diagnosis Date  . Arthritis   . Diabetes mellitus without complication ([place])   . Hyperlipidemia   . Hypertension    patient denies. States she is on losartan for kidney protection.   . Obesity   . Palpitations   . PONV (postoperative nausea and vomiting)   . Reflux esophagitis    Past Surgical History:  Procedure Laterality Date  . ABDOMINAL HYSTERECTOMY    . APPENDECTOMY    . CATARACT EXTRACTION Left   . CATARACT EXTRACTION W/PHACO Right 11/18/2013   Procedure: CATARACT EXTRACTION PHACO AND INTRAOCULAR LENS PLACEMENT (IOC);  Surgeon: Tonny Branch, MD;  Location: [facility];  Service: Ophthalmology;  Laterality: Right;  CDE:  7.76  . CESAREAN SECTION     x2  . CHEST WALL BIOPSY Right    removal of fatty tumor  . COLONOSCOPY  11/08/2004   IHK:[id number] rectosigmoid polyp/left side diverticula  . COLONOSCOPY  12/2009   RMR: left-sided diverticula, long tortuous colon  . COLONOSCOPY  2003   RMR: carcinoma in situ  . COLONOSCOPY N/A 04/07/2015    one 6 mm polyp and one 4 mm polyp, scattered left-sided diverticula, redundant colon.  Pathology with benign colonic mucosa.  Recommended repeat colonoscopy in 5 years   Social History   Socioeconomic History  . Marital status: Married    Spouse name: Not on file  . Number of children: 2  . Years of education: Not on file  . Highest education level: Not on file  Occupational History  . Not on file  Tobacco Use  . Smoking status: Never Smoker  . Smokeless tobacco: Never Used  Vaping Use  . Vaping Use: Never used  Substance and Sexual Activity  . Alcohol use: No  . Drug use: No  . Sexual activity: Yes    Birth control/protection: Surgical  Other Topics Concern  . Not on file  Social History Narrative  . Not on file   Social  Determinants of Health   Financial Resource Strain: Not on file  Food Insecurity: Not on file  Transportation Needs: Not on file  Physical Activity: Not on file  Stress: Not on file  Social Connections: Not on file   Outpatient Encounter Medications as of 09/21/2020  Medication Sig  . Accu-Chek Softclix Lancets lancets USE 1 TO CHECK GLUCOSE TWICE DAILY  . acetaminophen (TYLENOL) 325 MG tablet Take 650 mg by mouth every 6 (six) hours as needed for moderate pain.  . B-D UF III MINI PEN NEEDLES 31G X 5 MM MISC 1 each by Other route daily.  . Blood Glucose Monitoring Suppl (ACCU-CHEK GUIDE) w/Device KIT 1 each by Does not apply route 2 (two) times daily. Use as directed to check blood glucose two times daily.  . Cholecalciferol (DIALYVITE VITAMIN D 5000) 125 MCG (5000 UT) capsule Take 5,000 Units by mouth daily.  Marland Kitchen glucose blood test strip 1 each by Other route 2 (two) times daily. Use as instructed to test blood glucose two times daily ACCU-CHEK GUIDE TEST STRIPS  . ipratropium (ATROVENT) 0.03 % nasal spray Place 2 sprays into both nostrils 2 (two) times daily as needed for allergies.  Marland Kitchen loratadine (CLARITIN) 10 MG tablet Take 10 mg by mouth daily as needed  for allergies.  Marland Kitchen losartan (COZAAR) 25 MG tablet Take 25 mg by mouth at bedtime.   . meloxicam (MOBIC) 15 MG tablet Take 15 mg by mouth daily.  . polyethylene glycol-electrolytes (NULYTELY) 420 g solution As directed  . rosuvastatin (CRESTOR) 10 MG tablet Take 10 mg by mouth at bedtime.   . [DISCONTINUED] Dulaglutide (TRULICITY) 1.5 ZM/6.2HU SOPN Inject 1.5 mg into the skin once a week. (Patient taking differently: Inject 1.5 mg into the skin every Monday.)  . [DISCONTINUED] TRESIBA FLEXTOUCH 100 UNIT/ML FlexTouch Pen INJECT 40 UNITS INTO THE SKIN AT BEDTIME (Patient taking differently: Inject 40 Units into the skin at bedtime.)  . Dulaglutide (TRULICITY) 1.5 TM/5.4YT SOPN Inject 1.5 mg into the skin every Monday.  . insulin degludec  (TRESIBA FLEXTOUCH) 100 UNIT/ML FlexTouch Pen Inject 32 Units into the skin at bedtime.   No facility-administered encounter medications on file as of 09/21/2020.   ALLERGIES: Allergies  Allergen Reactions  . Ivp Dye [Iodinated Diagnostic Agents]     Red and itchy  . Erythromycin Palpitations   VACCINATION STATUS:  There is no immunization history on file for this patient.  Diabetes She presents for her follow-up diabetic visit. She has type 2 diabetes mellitus. Onset time: She was diagnosed at approximate age of 35 years. Her disease course has been stable. There are no hypoglycemic associated symptoms. Pertinent negatives for hypoglycemia include no confusion, headaches, pallor or seizures. There are no diabetic associated symptoms. Pertinent negatives for diabetes include no chest pain, no polydipsia, no polyphagia, no polyuria and no weight loss. There are no hypoglycemic complications. Symptoms are stable. There are no diabetic complications. Risk factors for coronary artery disease include diabetes mellitus, dyslipidemia, hypertension, obesity and sedentary lifestyle. Current diabetic treatment includes insulin injections. She is compliant with treatment most of the time. Her weight is stable. She is following a diabetic and generally healthy diet. When asked about meal planning, she reported none. She has had a previous visit with a dietitian. She rarely participates in exercise. Her home blood glucose trend is fluctuating minimally. Her breakfast blood glucose range is generally 70-90 mg/dl. Her overall blood glucose range is 70-90 mg/dl. (She presents today with her meter and logs showing tight fasting glucose overall.  Her POCT A1c today is 6.9%, improving from last visit of 7.1%.  She does have some mild fasting hypoglycemia noted.) An ACE inhibitor/angiotensin II receptor blocker is being taken. She does not see a podiatrist.Eye exam is current.  Hyperlipidemia This is a chronic problem.  The current episode started more than 1 year ago. The problem is controlled. Recent lipid tests were reviewed and are normal. Exacerbating diseases include diabetes and obesity. There are no known factors aggravating her hyperlipidemia. Pertinent negatives include no chest pain, myalgias or shortness of breath. Current antihyperlipidemic treatment includes statins. The current treatment provides moderate improvement of lipids. There are no compliance problems.  Risk factors for coronary artery disease include dyslipidemia, diabetes mellitus, hypertension, obesity, a sedentary lifestyle and post-menopausal.  Hypertension This is a chronic problem. The current episode started more than 1 year ago. The problem has been gradually improving since onset. The problem is controlled. Pertinent negatives include no chest pain, headaches, palpitations or shortness of breath. There are no associated agents to hypertension. Risk factors for coronary artery disease include dyslipidemia, diabetes mellitus, sedentary lifestyle and post-menopausal state. Past treatments include angiotensin blockers. The current treatment provides moderate improvement. There are no compliance problems.     Review of systems  Constitutional: + Minimally fluctuating body weight,  current Body mass index is 36.62 kg/m. , no fatigue, no subjective hyperthermia, no subjective hypothermia Eyes: no blurry vision, no xerophthalmia ENT: no sore throat, no nodules palpated in throat, no dysphagia/odynophagia, no hoarseness Cardiovascular: no chest pain, no shortness of breath, no palpitations, no leg swelling Respiratory: no cough, no shortness of breath Gastrointestinal: no nausea/vomiting/diarrhea Musculoskeletal: no muscle/joint aches Skin: no rashes, no hyperemia Neurological: no tremors, no numbness, no tingling, no dizziness Psychiatric: no depression, no anxiety    Objective:    BP 116/71 (BP Location: Left Arm, Patient Position:  Sitting)   Pulse 84   Ht 5' 7"  (1.702 m)   Wt 233 lb 12.8 oz (106.1 kg)   BMI 36.62 kg/m   Wt Readings from Last 3 Encounters:  09/21/20 233 lb 12.8 oz (106.1 kg)  09/10/20 232 lb (105.2 kg)  05/15/20 234 lb 3.2 oz (106.2 kg)    BP Readings from Last 3 Encounters:  09/21/20 116/71  09/10/20 (!) 105/57  05/15/20 130/74     Physical Exam- Limited  Constitutional:  Body mass index is 36.62 kg/m. , not in acute distress, normal state of mind Eyes:  EOMI, no exophthalmos Neck: Supple Cardiovascular: RRR, no murmers, rubs, or gallops, no edema Respiratory: Adequate breathing efforts, no crackles, rales, rhonchi, or wheezing Musculoskeletal: no gross deformities, strength intact in all four extremities, no gross restriction of joint movements Skin:  no rashes, no hyperemia Neurological: no tremor with outstretched hands    CMP     Component Value Date/Time   NA 143 09/01/2020 0802   NA 144 03/27/2019 0933   K 4.1 09/01/2020 0802   K 3.8 03/27/2019 0933   CL 108 09/01/2020 0802   CL 108 03/27/2019 0933   CO2 25 09/01/2020 0802   CO2 29 03/27/2019 0933   GLUCOSE 190 (H) 09/01/2020 0802   BUN 13 09/01/2020 0802   BUN 15 11/04/2019 0000   CREATININE 0.84 09/01/2020 0802   CREATININE 0.74 04/27/2020 1006   CALCIUM 9.2 09/01/2020 0802   CALCIUM 9.6 03/27/2019 0933   PROT 5.9 (L) 04/27/2020 1006   PROT 5.9 (A) 03/27/2019 0933   ALBUMIN 4.2 11/04/2019 0000   ALBUMIN 4.2 03/27/2019 0933   AST 19 04/27/2020 1006   AST 19 03/27/2019 0933   ALT 24 04/27/2020 1006   ALT 26 03/27/2019 0933   ALKPHOS 62 03/27/2019 0933   BILITOT 0.6 04/27/2020 1006   BILITOT 0.5 03/27/2019 0933   GFRNONAA >60 09/01/2020 0802   GFRNONAA 79 04/27/2020 1006   GFRAA 92 04/27/2020 1006    Diabetic Labs (most recent): Lab Results  Component Value Date   HGBA1C 6.9 09/21/2020   HGBA1C 7.1 (A) 05/01/2020   HGBA1C 6.9 11/04/2019   Lipid Panel     Component Value Date/Time   CHOL 157  04/27/2020 1006   CHOL 166 03/27/2019 0933   TRIG 144 04/27/2020 1006   TRIG 183 (A) 03/27/2019 0933   HDL 55 04/27/2020 1006   HDL 43 03/27/2019 0933   CHOLHDL 2.9 04/27/2020 1006   VLDL 22 05/12/2016 0917   LDLCALC 78 04/27/2020 1006    Assessment & Plan:   1) Uncontrolled type 2 diabetes mellitus with complication, with long-term current use of insulin ([facility]CC)  - She remains at a high risk for more acute and chronic complications of diabetes which include CAD, CVA, CKD, retinopathy, and neuropathy. These are all discussed in detail with the patient.  She  presents today with her meter and logs showing tight fasting glucose overall.  Her POCT A1c today is 6.9%, improving from last visit of 7.1%.  She does have some mild fasting hypoglycemia noted.  Analysis of her meter shows 7-day average of 83, 14-day average of 100, 30-day average of 110, and 90-day average of 116.  Recent labs reviewed.  - Nutritional counseling repeated at each appointment due to patients tendency to fall back in to old habits.  - The patient admits there is a room for improvement in their diet and drink choices. -  Suggestion is made for the patient to avoid simple carbohydrates from their diet including Cakes, Sweet Desserts / Pastries, Ice Cream, Soda (diet and regular), Sweet Tea, Candies, Chips, Cookies, Sweet Pastries,  Store Bought Juices, Alcohol in Excess of  1-2 drinks a day, Artificial Sweeteners, Coffee Creamer, and "Sugar-free" Products. This will help patient to have stable blood glucose profile and potentially avoid unintended weight gain.   - I encouraged the patient to switch to  unprocessed or minimally processed complex starch and increased protein intake (animal or plant source), fruits, and vegetables.   - Patient is advised to stick to a routine mealtimes to eat 3 meals  a day and avoid unnecessary snacks ( to snack only to correct hypoglycemia).  - I have approached patient with the following  individualized plan to manage diabetes and patient agrees.  -Given her tight fasting glycemic profile, she will benefit from slight decrease in her Tresiba to 32 units SQ nightly.  She can continue her Trulicity 1.5 SQ weekly.  -She is encouraged to continue monitoring blood glucose twice daily, before breakfast and before bed, and to call the clinic if she has readings less than 70 or greater than 200 for 3 tests in a row.  -She has history of intolerance to metformin.   - Patient specific target  for A1c; LDL, HDL, Triglycerides, and  Waist Circumference were discussed in detail.  2) BP/HTN:  Her blood pressure is controlled to target.  She is encouraged to continue Losartan 25 mg p.o. daily at bedtime.  3) Lipids/HPL:  Her most recent lipid panel from 04/27/20 shows controlled LDL at 78.  She is advised to continue Crestor 10 mg po daily at bedtime.  Side effects and precautions discussed with her.  4)  Weight/Diet:  Patient does not feel like she would benefit from a CDE consult.  Exercise, and carbohydrates information provided.  5) Vitamin D deficiency: Her most recent vitamin D level on 04/27/20 was 26.  She will benefit from restarting Vitamin D3 5000 units daily during the colder months.  Will recheck vitamin d level on subsequent visits.  6) Chronic Care/Health Maintenance: -Patient is on ACEI/ARB and Statin medications and encouraged to continue to follow up with Ophthalmology, Podiatrist at least yearly or according to recommendations, and advised to  stay away from smoking. I have recommended yearly flu vaccine and pneumonia vaccination at least every 5 years; moderate intensity exercise for up to 150 minutes weekly; and  sleep for at least 7 hours a day.  - I advised patient to maintain close follow up with No primary care provider on file. for primary care needs.    I spent 30 minutes in the care of the patient today including review of labs from [place], Lipids, Thyroid  Function, Hematology (current and previous including abstractions from other facilities); face-to-face time discussing  her blood glucose readings/logs, discussing hypoglycemia and hyperglycemia episodes and  symptoms, medications doses, her options of short and long term treatment based on the latest standards of care / guidelines;  discussion about incorporating lifestyle medicine;  and documenting the encounter.    Please refer to Patient Instructions for Blood Glucose Monitoring and Insulin/Medications Dosing Guide"  in media tab for additional information. Please  also refer to " Patient Self Inventory" in the Media  tab for reviewed elements of pertinent patient history.  Ruthe Mannan participated in the discussions, expressed understanding, and voiced agreement with the above plans.  All questions were answered to her satisfaction. she is encouraged to contact clinic should she have any questions or concerns prior to her return visit.    Follow up plan: -Return in about 4 months (around 01/21/2021) for Diabetes follow up with A1c in office, No previsit labs, Bring glucometer and logs.  Rayetta Pigg, [facility] [address] [place], [place] [address] Phone: [phone] Fax: [phone]   09/21/2020, 2:45 PM

## 2020-09-21 NOTE — Patient Instructions (Signed)

## 2020-09-24 DIAGNOSIS — E039 Hypothyroidism, unspecified: Secondary | ICD-10-CM | POA: Diagnosis not present

## 2020-09-24 DIAGNOSIS — E119 Type 2 diabetes mellitus without complications: Secondary | ICD-10-CM | POA: Diagnosis not present

## 2020-09-24 DIAGNOSIS — E7849 Other hyperlipidemia: Secondary | ICD-10-CM | POA: Diagnosis not present

## 2020-09-24 DIAGNOSIS — E782 Mixed hyperlipidemia: Secondary | ICD-10-CM | POA: Diagnosis not present

## 2020-09-24 DIAGNOSIS — I10 Essential (primary) hypertension: Secondary | ICD-10-CM | POA: Diagnosis not present

## 2020-09-29 DIAGNOSIS — Z6836 Body mass index (BMI) 36.0-36.9, adult: Secondary | ICD-10-CM | POA: Diagnosis not present

## 2020-09-29 DIAGNOSIS — G4700 Insomnia, unspecified: Secondary | ICD-10-CM | POA: Diagnosis not present

## 2020-09-29 DIAGNOSIS — E870 Hyperosmolality and hypernatremia: Secondary | ICD-10-CM | POA: Diagnosis not present

## 2020-09-29 DIAGNOSIS — E119 Type 2 diabetes mellitus without complications: Secondary | ICD-10-CM | POA: Diagnosis not present

## 2020-09-29 DIAGNOSIS — M25562 Pain in left knee: Secondary | ICD-10-CM | POA: Diagnosis not present

## 2020-09-29 DIAGNOSIS — I10 Essential (primary) hypertension: Secondary | ICD-10-CM | POA: Diagnosis not present

## 2020-10-15 DIAGNOSIS — G8929 Other chronic pain: Secondary | ICD-10-CM | POA: Diagnosis not present

## 2020-10-15 DIAGNOSIS — M25562 Pain in left knee: Secondary | ICD-10-CM | POA: Diagnosis not present

## 2020-10-15 DIAGNOSIS — M25561 Pain in right knee: Secondary | ICD-10-CM | POA: Diagnosis not present

## 2020-11-09 ENCOUNTER — Other Ambulatory Visit: Payer: Self-pay | Admitting: "Endocrinology

## 2020-11-09 DIAGNOSIS — IMO0002 Reserved for concepts with insufficient information to code with codable children: Secondary | ICD-10-CM

## 2020-11-09 MED ORDER — TRESIBA FLEXTOUCH 100 UNIT/ML ~~LOC~~ SOPN: PEN_INJECTOR | SUBCUTANEOUS | 0 refills | Status: DC

## 2020-11-11 ENCOUNTER — Other Ambulatory Visit: Payer: Self-pay | Admitting: Nurse Practitioner

## 2020-11-17 ENCOUNTER — Other Ambulatory Visit (HOSPITAL_COMMUNITY): Payer: Medicare PPO

## 2020-11-19 ENCOUNTER — Encounter (HOSPITAL_COMMUNITY)
Admission: RE | Disposition: A | Discharge status: Home / Self Care | Payer: Self-pay | Source: Home / Self Care | Attending: Internal Medicine

## 2020-11-19 ENCOUNTER — Ambulatory Visit (HOSPITAL_COMMUNITY): Payer: Medicare PPO | Admitting: Anesthesiology

## 2020-11-19 ENCOUNTER — Encounter (HOSPITAL_COMMUNITY): Payer: Self-pay | Admitting: Internal Medicine

## 2020-11-19 ENCOUNTER — Ambulatory Visit (HOSPITAL_COMMUNITY)
Admission: RE | Admit: 2020-11-19 | Discharge: 2020-11-19 | Disposition: A | Discharge status: Home / Self Care | Payer: Medicare PPO | Attending: Internal Medicine | Admitting: Internal Medicine

## 2020-11-19 ENCOUNTER — Other Ambulatory Visit: Payer: Self-pay

## 2020-11-19 DIAGNOSIS — K621 Rectal polyp: Secondary | ICD-10-CM

## 2020-11-19 DIAGNOSIS — E119 Type 2 diabetes mellitus without complications: Secondary | ICD-10-CM | POA: Diagnosis not present

## 2020-11-19 DIAGNOSIS — Z86004 Personal history of in-situ neoplasm of other and unspecified digestive organs: Secondary | ICD-10-CM | POA: Insufficient documentation

## 2020-11-19 DIAGNOSIS — K635 Polyp of colon: Secondary | ICD-10-CM | POA: Diagnosis not present

## 2020-11-19 DIAGNOSIS — D125 Benign neoplasm of sigmoid colon: Secondary | ICD-10-CM | POA: Diagnosis not present

## 2020-11-19 DIAGNOSIS — Z1211 Encounter for screening for malignant neoplasm of colon: Secondary | ICD-10-CM | POA: Diagnosis not present

## 2020-11-19 DIAGNOSIS — D124 Benign neoplasm of descending colon: Secondary | ICD-10-CM | POA: Diagnosis not present

## 2020-11-19 DIAGNOSIS — Z881 Allergy status to other antibiotic agents status: Secondary | ICD-10-CM | POA: Insufficient documentation

## 2020-11-19 DIAGNOSIS — D120 Benign neoplasm of cecum: Secondary | ICD-10-CM | POA: Diagnosis not present

## 2020-11-19 DIAGNOSIS — E669 Obesity, unspecified: Secondary | ICD-10-CM | POA: Insufficient documentation

## 2020-11-19 DIAGNOSIS — K5730 Diverticulosis of large intestine without perforation or abscess without bleeding: Secondary | ICD-10-CM | POA: Insufficient documentation

## 2020-11-19 DIAGNOSIS — Z86010 Personal history of colonic polyps: Secondary | ICD-10-CM | POA: Diagnosis not present

## 2020-11-19 DIAGNOSIS — Z6835 Body mass index (BMI) 35.0-35.9, adult: Secondary | ICD-10-CM | POA: Diagnosis not present

## 2020-11-19 DIAGNOSIS — Z79899 Other long term (current) drug therapy: Secondary | ICD-10-CM | POA: Insufficient documentation

## 2020-11-19 DIAGNOSIS — Z85038 Personal history of other malignant neoplasm of large intestine: Secondary | ICD-10-CM | POA: Diagnosis not present

## 2020-11-19 HISTORY — PX: BIOPSY: SHX5522

## 2020-11-19 HISTORY — PX: COLONOSCOPY WITH PROPOFOL: SHX5780

## 2020-11-19 HISTORY — PX: POLYPECTOMY: SHX5525

## 2020-11-19 LAB — GLUCOSE, CAPILLARY: Glucose-Capillary: 122 mg/dL — ABNORMAL HIGH (ref 70–99)

## 2020-11-19 SURGERY — COLONOSCOPY WITH PROPOFOL
Anesthesia: General

## 2020-11-19 MED ORDER — PROPOFOL 10 MG/ML IV BOLUS
INTRAVENOUS | Status: DC | PRN
  Administered 2020-11-19: 50 mg via INTRAVENOUS
  Administered 2020-11-19: 80 mg via INTRAVENOUS

## 2020-11-19 MED ORDER — PROPOFOL 500 MG/50ML IV EMUL
INTRAVENOUS | Status: DC | PRN
  Administered 2020-11-19: 150 ug/kg/min via INTRAVENOUS

## 2020-11-19 MED ORDER — LACTATED RINGERS IV SOLN: INTRAVENOUS | Status: DC

## 2020-11-19 NOTE — Op Note (Signed)
[facility] Patient Name: Amanda Hopkins Procedure Date: 11/19/2020 9:01 AM MRN: [id number] Date of Birth: 1945-11-21 Attending MD: Norvel Richards , MD CSN: [id number] Age: 75 Admit Type: Outpatient Procedure:                Colonoscopy Indications:              High risk colon cancer surveillance: Personal                            history of colonic polyps Providers:                Norvel Richards, MD, Otis Peak B. Sharon Seller, RN,                            Nelma Rothman, Technician Referring MD:              Medicines:                Propofol per Anesthesia Complications:            No immediate complications. Estimated Blood Loss:     Estimated blood loss was minimal. Procedure:                Pre-Anesthesia Assessment:                           - Prior to the procedure, a History and Physical                            was performed, and patient medications and                            allergies were reviewed. The patient's tolerance of                            previous anesthesia was also reviewed. The risks                            and benefits of the procedure and the sedation                            options and risks were discussed with the patient.                            All questions were answered, and informed consent                            was obtained. Prior Anticoagulants: The patient has                            taken no previous anticoagulant or antiplatelet                            agents. ASA Grade Assessment: III - A patient with  severe systemic disease. After reviewing the risks                            and benefits, the patient was deemed in                            satisfactory condition to undergo the procedure.                           After obtaining informed consent, the colonoscope                            was passed under direct vision. Throughout the                            procedure, the  patient's blood pressure, pulse, and                            oxygen saturations were monitored continuously. The                            CF-HQ190L ([id number]) scope was introduced through                            the anus and advanced to the the cecum, identified                            by appendiceal orifice and ileocecal valve. The                            colonoscopy was performed without difficulty. The                            patient tolerated the procedure well. The quality                            of the bowel preparation was adequate. Scope In: 9:22:28 AM Scope Out: 9:56:32 AM Scope Withdrawal Time: 0 hours 14 minutes 6 seconds  Total Procedure Duration: 0 hours 34 minutes 4 seconds  Findings:      The perianal and digital rectal examinations were normal.      Many large-mouthed diverticula were found in the sigmoid colon.      Three semi-pedunculated polyps were found in the rectum, descending       colon and cecum. The polyps were 4 to 6 mm in size. These polyps were       removed with a cold snare. Resection and retrieval were complete.       Estimated blood loss was minimal.      A 4 mm polyp was found in the sigmoid colon. The polyp was sessile. The       polyp was removed with a cold biopsy forceps. Resection and retrieval       were complete. Estimated blood loss was minimal.      A 8 mm polyp was found in the descending colon. The polyp was  pedunculated. The polyp was removed with a hot snare (not recovered at       the time of dictation). Resection and retrieval were complete. Estimated       blood loss: none.      The exam was otherwise without abnormality on direct and retroflexion       views. Impression:               - Diverticulosis in the sigmoid colon.                           - Three 4 to 6 mm polyps in the rectum, in the                            descending colon and in the cecum, removed with a                            cold snare.  Resected and retrieved.                           - One 4 mm polyp in the sigmoid colon, removed with                            a cold biopsy forceps. Resected and retrieved.                           - One 8 mm polyp in the descending colon, removed                            with a hot snare. recovery pending.                           - The examination was otherwise normal on direct                            and retroflexion views. Moderate Sedation:      Moderate (conscious) sedation was personally administered by an       anesthesia professional. The following parameters were monitored: oxygen       saturation, heart rate, blood pressure, respiratory rate, EKG, adequacy       of pulmonary ventilation, and response to care. Recommendation:           - Patient has a contact number available for                            emergencies. The signs and symptoms of potential                            delayed complications were discussed with the                            patient. Return to normal activities tomorrow.                            Written discharge instructions were provided to  the                            patient.                           - Resume previous diet.                           - Continue present medications.                           - Repeat colonoscopy date to be determined after                            pending pathology results are reviewed for                            surveillance.                           - Return to GI office (date not yet determined). Procedure Code(s):        --- Professional ---                           [phone], Colonoscopy, flexible; with removal of                            tumor(s), polyp(s), or other lesion(s) by snare                            technique                           45380, 50, Colonoscopy, flexible; with biopsy,                            single or multiple Diagnosis Code(s):        --- Professional ---                            Z86.010, Personal history of colonic polyps                           K62.1, Rectal polyp                           K63.5, Polyp of colon                           K57.30, Diverticulosis of large intestine without                            perforation or abscess without bleeding CPT copyright 2019 American Medical Association. All rights reserved. The codes documented in this report are preliminary and upon coder review may  be revised to meet current compliance requirements. Cristopher Estimable. Arkin Imran, MD Norvel Richards, MD 11/19/2020 10:07:00 AM This report has been  signed electronically. Number of Addenda: 0

## 2020-11-19 NOTE — Anesthesia Preprocedure Evaluation (Addendum)
Anesthesia Evaluation  Patient identified by MRN, date of birth, ID band Patient awake    Reviewed: Allergy & Precautions, NPO status , Patient's Chart, lab work & pertinent test results  History of Anesthesia Complications (+) PONV and history of anesthetic complications  Airway Mallampati: III  TM Distance: >3 FB Neck ROM: Full    Dental  (+) Dental Advisory Given, Missing   Pulmonary neg pulmonary ROS,    Pulmonary exam normal breath sounds clear to auscultation       Cardiovascular Exercise Tolerance: Good hypertension (on losartan for renal protection), Pt. on medications Normal cardiovascular exam Rhythm:Regular Rate:Normal     Neuro/Psych  Neuromuscular disease (right sided bell's palsy) negative psych ROS   GI/Hepatic negative GI ROS, Neg liver ROS,   Endo/Other  diabetes, Well Controlled, Type 2, Insulin Dependent  Renal/GU      Musculoskeletal  (+) Arthritis ,   Abdominal   Peds  Hematology   Anesthesia Other Findings Snoring?  Reproductive/Obstetrics                           Anesthesia Physical Anesthesia Plan  ASA: 2  Anesthesia Plan: General   Post-op Pain Management:    Induction: Intravenous  PONV Risk Score and Plan: Propofol infusion  Airway Management Planned: Nasal Cannula and Natural Airway  Additional Equipment:   Intra-op Plan:   Post-operative Plan:   Informed Consent: I have reviewed the patients History and Physical, chart, labs and discussed the procedure including the risks, benefits and alternatives for the proposed anesthesia with the patient or authorized representative who has indicated his/her understanding and acceptance.     Dental advisory given  Plan Discussed with: CRNA and Surgeon  Anesthesia Plan Comments:         Anesthesia Quick Evaluation

## 2020-11-19 NOTE — Transfer of Care (Signed)
Immediate Anesthesia Transfer of Care Note  Patient: Amanda Hopkins  Procedure(s) Performed: COLONOSCOPY WITH PROPOFOL BIOPSY POLYPECTOMY  Patient Location: Endoscopy Unit  Anesthesia Type:General  Level of Consciousness: awake and alert   Airway & Oxygen Therapy: Patient Spontanous Breathing  Post-op Assessment: Report given to RN and Post -op Vital signs reviewed and stable  Post vital signs: Reviewed and stable  Last Vitals:  Vitals Value Taken Time  BP    Temp    Pulse 77   Resp 16   SpO2 96%     Last Pain:  Vitals:   11/19/20 0851  TempSrc: Oral  PainSc:       Patients Stated Pain Goal: 7 (84/13/24 4010)  Complications: No notable events documented.

## 2020-11-19 NOTE — H&P (Signed)
@LOGO @   Primary Care Physician:  [facility] Gastroenterologist:  Dr. Gala Romney  Pre-Procedure History & Physical: HPI:  Amanda Hopkins is a 75 y.o. female here for surveillance colonoscopy.  History of carcinoma in situ rectal polyp removed 2003 with negative follow-up endoscopy/2016.  Small polyps at that time removed.  Normal-appearing mucosa on pathology.   Past Medical History:  Diagnosis Date   Arthritis    Diabetes mellitus without complication ([place])    Hyperlipidemia    Hypertension    patient denies. States she is on losartan for kidney protection.    Obesity    Palpitations    PONV (postoperative nausea and vomiting)    Reflux esophagitis     Past Surgical History:  Procedure Laterality Date   ABDOMINAL HYSTERECTOMY     APPENDECTOMY     CATARACT EXTRACTION Left    CATARACT EXTRACTION W/PHACO Right 11/18/2013   Procedure: CATARACT EXTRACTION PHACO AND INTRAOCULAR LENS PLACEMENT (IOC);  Surgeon: Tonny Branch, MD;  Location: [facility];  Service: Ophthalmology;  Laterality: Right;  CDE:  7.76   CESAREAN SECTION     x2   CHEST WALL BIOPSY Right    removal of fatty tumor   COLONOSCOPY  11/08/2004   AJO:INOMVEHMCN rectosigmoid polyp/left side diverticula   COLONOSCOPY  12/2009   RMR: left-sided diverticula, long tortuous colon   COLONOSCOPY  2003   RMR: carcinoma in situ   COLONOSCOPY N/A 04/07/2015    one 6 mm polyp and one 4 mm polyp, scattered left-sided diverticula, redundant colon.  Pathology with benign colonic mucosa.  Recommended repeat colonoscopy in 5 years    Prior to Admission medications   Medication Sig Start Date End Date Taking? Authorizing Provider  ARTIFICIAL TEAR SOLUTION OP Place 1 drop into both eyes daily as needed (dry eyes).   Yes [provider]  Cholecalciferol (DIALYVITE VITAMIN D 5000) 125 MCG (5000 UT) capsule Take 5,000 Units by mouth daily.   Yes [provider]  Dulaglutide (TRULICITY) 1.5 OB/0.9GG SOPN  Inject 1.5 mg into the skin every Monday. 09/21/20  Yes Reardon, Juanetta Beets, NP  insulin degludec (TRESIBA FLEXTOUCH) 100 UNIT/ML FlexTouch Pen INJECT 40 UNITS INTO THE SKIN AT BEDTIME Patient taking differently: Inject 40 Units into the skin at bedtime. 11/09/20  Yes Reardon, Juanetta Beets, NP  Inulin (PHILLIPS FIBER GOOD PO) Take 2 capsules by mouth daily.   Yes [provider]  ipratropium (ATROVENT) 0.03 % nasal spray Place 1 spray into both nostrils 2 (two) times daily as needed for allergies. 07/03/20  Yes [provider]  loratadine (CLARITIN) 10 MG tablet Take 10 mg by mouth daily.   Yes [provider]  losartan (COZAAR) 25 MG tablet Take 25 mg by mouth at bedtime.    Yes [provider]  rosuvastatin (CRESTOR) 10 MG tablet Take 10 mg by mouth at bedtime.    Yes [provider]  Accu-Chek Softclix Lancets lancets USE 1  TO CHECK GLUCOSE TWICE DAILY 11/11/20   Nida, Marella Chimes, MD  B-D UF III MINI PEN NEEDLES 31G X 5 MM MISC 1 each by Other route daily. 03/31/20   Cassandria Anger, MD  Blood Glucose Monitoring Suppl (ACCU-CHEK GUIDE) w/Device KIT 1 each by Does not apply route 2 (two) times daily. Use as directed to check blood glucose two times daily. 07/25/19   Cassandria Anger, MD  glucose blood test strip 1 each by Other route 2 (two) times daily. Use as  instructed to test blood glucose two times daily ACCU-CHEK GUIDE TEST STRIPS 12/17/19   Cassandria Anger, MD  ibuprofen (ADVIL) 200 MG tablet Take 400 mg by mouth at bedtime.    [provider]  polyethylene glycol-electrolytes (NULYTELY) 420 g solution As directed 09/14/20   Tywone Bembenek, Cristopher Estimable, MD  trolamine salicylate (ASPERCREME) 10 % cream Apply 1 application topically as needed (knee pain).    [provider]    Allergies as of 09/14/2020 - Review Complete 09/10/2020  Allergen Reaction Noted   Ivp dye [iodinated diagnostic agents]  12/31/2019   Erythromycin  Palpitations 04/29/2013    Family History  Problem Relation Age of Onset   Hypertension Mother    Colon cancer Cousin        paternal    Colon cancer Cousin        paternal   Colon cancer Cousin        paternal   Colon polyps Father    Prostate cancer Father    Breast cancer Maternal Aunt    Colon polyps Brother        multiple   Colon polyps Brother     Social History   Socioeconomic History   Marital status: Married    Spouse name: Not on file   Number of children: 2   Years of education: Not on file   Highest education level: Not on file  Occupational History   Not on file  Tobacco Use   Smoking status: Never   Smokeless tobacco: Never  Vaping Use   Vaping Use: Never used  Substance and Sexual Activity   Alcohol use: No   Drug use: No   Sexual activity: Yes    Birth control/protection: Surgical  Other Topics Concern   Not on file  Social History Narrative   Not on file   Social Determinants of Health   Financial Resource Strain: Not on file  Food Insecurity: Not on file  Transportation Needs: Not on file  Physical Activity: Not on file  Stress: Not on file  Social Connections: Not on file  Intimate Partner Violence: Not on file    Review of Systems: See HPI, otherwise negative ROS  Physical Exam: BP 138/68   Pulse 86   Temp 98 F (36.7 C) (Oral)   Resp (!) 22   Ht 5' 7"  (1.702 m)   Wt 102.1 kg   SpO2 96%   BMI 35.24 kg/m  General:   Alert,  Well-developed, well-nourished, pleasant and cooperative in NAD Neck:  Supple; no masses or thyromegaly. No significant cervical adenopathy. Lungs:  Clear throughout to auscultation.   No wheezes, crackles, or rhonchi. No acute distress. Heart:  Regular rate and rhythm; no murmurs, clicks, rubs,  or gallops. Abdomen: Non-distended, normal bowel sounds.  Soft and nontender without appreciable mass or hepatosplenomegaly.  Pulses:  Normal pulses noted. Extremities:  Without clubbing or  edema.  Impression/Plan: 75 year old lady with high-grade dysplasia rectal polyp removed in 2003.  Negative subsequent colonoscopies.  She is here for a follow-up colonoscopy at this time. The risks, benefits, limitations, alternatives and imponderables have been reviewed with the patient. Questions have been answered. All parties are agreeable.       Notice: This dictation was prepared with Dragon dictation along with smaller phrase technology. Any transcriptional errors that result from this process are unintentional and may not be corrected upon review.

## 2020-11-19 NOTE — Anesthesia Postprocedure Evaluation (Signed)
Anesthesia Post Note  Patient: Amanda Hopkins  Procedure(s) Performed: COLONOSCOPY WITH PROPOFOL BIOPSY POLYPECTOMY  Patient location during evaluation: Endoscopy Anesthesia Type: General Level of consciousness: awake and alert and oriented Pain management: pain level controlled Vital Signs Assessment: post-procedure vital signs reviewed and stable Respiratory status: spontaneous breathing and respiratory function stable Cardiovascular status: blood pressure returned to baseline and stable Postop Assessment: no apparent nausea or vomiting Anesthetic complications: no   No notable events documented.   Last Vitals:  Vitals:   11/19/20 0851 11/19/20 1000  BP: 138/68 (!) 94/54  Pulse: 86 78  Resp: (!) 22 20  Temp: 36.7 C (!) 36.4 C  SpO2: 96% 100%    Last Pain:  Vitals:   11/19/20 1000  TempSrc: Oral  PainSc: 0-No pain                 Theodoros Stjames C Garvis Downum

## 2020-11-19 NOTE — Discharge Instructions (Addendum)
  Colonoscopy Discharge Instructions  Read the instructions outlined below and refer to this sheet in the next few weeks. These discharge instructions provide you with general information on caring for yourself after you leave the hospital. Your doctor may also give you specific instructions. While your treatment has been planned according to the most current medical practices available, unavoidable complications occasionally occur. If you have any problems or questions after discharge, call Dr. Gala Romney at [phone]. ACTIVITY You may resume your regular activity, but move at a slower pace for the next 24 hours.  Take frequent rest periods for the next 24 hours.  Walking will help get rid of the air and reduce the bloated feeling in your belly (abdomen).  No driving for 24 hours (because of the medicine (anesthesia) used during the test).   Do not sign any important legal documents or operate any machinery for 24 hours (because of the anesthesia used during the test).  NUTRITION Drink plenty of fluids.  You may resume your normal diet as instructed by your doctor.  Begin with a light meal and progress to your normal diet. Heavy or fried foods are harder to digest and may make you feel sick to your stomach (nauseated).  Avoid alcoholic beverages for 24 hours or as instructed.  MEDICATIONS You may resume your normal medications unless your doctor tells you otherwise.  WHAT YOU CAN EXPECT TODAY Some feelings of bloating in the abdomen.  Passage of more gas than usual.  Spotting of blood in your stool or on the toilet paper.  IF YOU HAD POLYPS REMOVED DURING THE COLONOSCOPY: No aspirin products for 7 days or as instructed.  No alcohol for 7 days or as instructed.  Eat a soft diet for the next 24 hours.  FINDING OUT THE RESULTS OF YOUR TEST Not all test results are available during your visit. If your test results are not back during the visit, make an appointment with your caregiver to find out the  results. Do not assume everything is normal if you have not heard from your caregiver or the medical facility. It is important for you to follow up on all of your test results.  SEEK IMMEDIATE MEDICAL ATTENTION IF: You have more than a spotting of blood in your stool.  Your belly is swollen (abdominal distention).  You are nauseated or vomiting.  You have a temperature over 101.  You have abdominal pain or discomfort that is severe or gets worse throughout the day.    5 polyps removed in your colon today  Diverticulosis and polyp information provided  Further recommendations to follow pending review of pathology report  At patient request, I called Inaaya Vellucci at [phone] -reviewed findings and recommendations  Ok to resume all medications

## 2020-11-20 ENCOUNTER — Encounter: Payer: Self-pay | Admitting: Internal Medicine

## 2020-11-20 LAB — SURGICAL PATHOLOGY

## 2020-11-23 ENCOUNTER — Telehealth: Payer: Self-pay

## 2020-11-23 NOTE — Telephone Encounter (Signed)
Result letter mailed to the pt.

## 2020-11-25 ENCOUNTER — Encounter (HOSPITAL_COMMUNITY): Payer: Self-pay | Admitting: Internal Medicine

## 2021-01-05 DIAGNOSIS — E7849 Other hyperlipidemia: Secondary | ICD-10-CM | POA: Diagnosis not present

## 2021-01-05 DIAGNOSIS — E039 Hypothyroidism, unspecified: Secondary | ICD-10-CM | POA: Diagnosis not present

## 2021-01-05 DIAGNOSIS — E782 Mixed hyperlipidemia: Secondary | ICD-10-CM | POA: Diagnosis not present

## 2021-01-05 DIAGNOSIS — E119 Type 2 diabetes mellitus without complications: Secondary | ICD-10-CM | POA: Diagnosis not present

## 2021-01-05 DIAGNOSIS — I10 Essential (primary) hypertension: Secondary | ICD-10-CM | POA: Diagnosis not present

## 2021-01-07 DIAGNOSIS — G4700 Insomnia, unspecified: Secondary | ICD-10-CM | POA: Diagnosis not present

## 2021-01-07 DIAGNOSIS — Z1389 Encounter for screening for other disorder: Secondary | ICD-10-CM | POA: Diagnosis not present

## 2021-01-07 DIAGNOSIS — R319 Hematuria, unspecified: Secondary | ICD-10-CM | POA: Diagnosis not present

## 2021-01-07 DIAGNOSIS — Z6836 Body mass index (BMI) 36.0-36.9, adult: Secondary | ICD-10-CM | POA: Diagnosis not present

## 2021-01-07 DIAGNOSIS — M25562 Pain in left knee: Secondary | ICD-10-CM | POA: Diagnosis not present

## 2021-01-07 DIAGNOSIS — I10 Essential (primary) hypertension: Secondary | ICD-10-CM | POA: Diagnosis not present

## 2021-01-07 DIAGNOSIS — E119 Type 2 diabetes mellitus without complications: Secondary | ICD-10-CM | POA: Diagnosis not present

## 2021-01-07 DIAGNOSIS — Z1331 Encounter for screening for depression: Secondary | ICD-10-CM | POA: Diagnosis not present

## 2021-01-21 ENCOUNTER — Encounter: Payer: Self-pay | Admitting: Nurse Practitioner

## 2021-01-21 ENCOUNTER — Ambulatory Visit: Payer: Medicare PPO | Admitting: Nurse Practitioner

## 2021-01-21 ENCOUNTER — Other Ambulatory Visit: Payer: Self-pay

## 2021-01-21 VITALS — BP 123/77 | HR 81 | Ht 67.0 in | Wt 232.0 lb

## 2021-01-21 DIAGNOSIS — E782 Mixed hyperlipidemia: Secondary | ICD-10-CM

## 2021-01-21 DIAGNOSIS — IMO0002 Reserved for concepts with insufficient information to code with codable children: Secondary | ICD-10-CM

## 2021-01-21 DIAGNOSIS — E118 Type 2 diabetes mellitus with unspecified complications: Secondary | ICD-10-CM | POA: Diagnosis not present

## 2021-01-21 DIAGNOSIS — E559 Vitamin D deficiency, unspecified: Secondary | ICD-10-CM

## 2021-01-21 DIAGNOSIS — Z794 Long term (current) use of insulin: Secondary | ICD-10-CM

## 2021-01-21 DIAGNOSIS — E1165 Type 2 diabetes mellitus with hyperglycemia: Secondary | ICD-10-CM

## 2021-01-21 DIAGNOSIS — I10 Essential (primary) hypertension: Secondary | ICD-10-CM | POA: Diagnosis not present

## 2021-01-21 LAB — POCT GLYCOSYLATED HEMOGLOBIN (HGB A1C): Hemoglobin A1C: 6.8 % — AB (ref 4.0–5.6)

## 2021-01-21 MED ORDER — TRESIBA FLEXTOUCH 100 UNIT/ML ~~LOC~~ SOPN: 30.0000 [IU] | PEN_INJECTOR | Freq: Every day | SUBCUTANEOUS | 3 refills | Status: DC

## 2021-01-21 NOTE — Progress Notes (Signed)
01/21/2021            Endocrinology follow-up note    Subjective:    Patient ID: Amanda Hopkins, female    DOB: Jun 04, 1945, PCP Practice, [facility]   Past Medical History:  Diagnosis Date   Arthritis    Diabetes mellitus without complication ([place])    Hyperlipidemia    Hypertension    patient denies. States she is on losartan for kidney protection.    Obesity    Palpitations    PONV (postoperative nausea and vomiting)    Reflux esophagitis    Past Surgical History:  Procedure Laterality Date   ABDOMINAL HYSTERECTOMY     APPENDECTOMY     BIOPSY  11/19/2020   Procedure: BIOPSY;  Surgeon: Daneil Dolin, MD;  Location: [facility];  Service: Endoscopy;;   CATARACT EXTRACTION Left    CATARACT EXTRACTION W/PHACO Right 11/18/2013   Procedure: CATARACT EXTRACTION PHACO AND INTRAOCULAR LENS PLACEMENT ([place]);  Surgeon: Tonny Branch, MD;  Location: [facility];  Service: Ophthalmology;  Laterality: Right;  CDE:  7.76   CESAREAN SECTION     x2   CHEST WALL BIOPSY Right    removal of fatty tumor   COLONOSCOPY  11/08/2004   TML:[id number] rectosigmoid polyp/left side diverticula   COLONOSCOPY  12/2009   RMR: left-sided diverticula, long tortuous colon   COLONOSCOPY  2003   RMR: carcinoma in situ   COLONOSCOPY N/A 04/07/2015    one 6 mm polyp and one 4 mm polyp, scattered left-sided diverticula, redundant colon.  Pathology with benign colonic mucosa.  Recommended repeat colonoscopy in 5 years   COLONOSCOPY WITH PROPOFOL N/A 11/19/2020   Procedure: COLONOSCOPY WITH PROPOFOL;  Surgeon: Daneil Dolin, MD;  Location: [facility];  Service: Endoscopy;  Laterality: N/A;  10:00am   POLYPECTOMY  11/19/2020   Procedure: POLYPECTOMY;  Surgeon: Daneil Dolin, MD;  Location: [facility];  Service: Endoscopy;;   Social History   Socioeconomic History   Marital status: Married    Spouse name: Not on file   Number of children: 2   Years of education: Not on file   Highest education  level: Not on file  Occupational History   Not on file  Tobacco Use   Smoking status: Never   Smokeless tobacco: Never  Vaping Use   Vaping Use: Never used  Substance and Sexual Activity   Alcohol use: No   Drug use: No   Sexual activity: Yes    Birth control/protection: Surgical  Other Topics Concern   Not on file  Social History Narrative   Not on file   Social Determinants of Health   Financial Resource Strain: Not on file  Food Insecurity: Not on file  Transportation Needs: Not on file  Physical Activity: Not on file  Stress: Not on file  Social Connections: Not on file   Outpatient Encounter Medications as of 01/21/2021  Medication Sig   Accu-Chek Softclix Lancets lancets USE 1  TO CHECK GLUCOSE TWICE DAILY   ARTIFICIAL TEAR SOLUTION OP Place 1 drop into both eyes daily as needed (dry eyes).   B-D UF III MINI PEN NEEDLES 31G X 5 MM MISC 1 each by Other route daily.   Blood Glucose Monitoring Suppl (ACCU-CHEK GUIDE) w/Device KIT 1 each by Does not apply route 2 (two) times daily. Use as directed to check blood glucose two times daily.   Cholecalciferol (DIALYVITE VITAMIN D 5000) 125 MCG (5000 UT) capsule Take 5,000  Units by mouth daily.   Dulaglutide (TRULICITY) 1.5 JO/8.4ZY SOPN Inject 1.5 mg into the skin every Monday.   glucose blood test strip 1 each by Other route 2 (two) times daily. Use as instructed to test blood glucose two times daily ACCU-CHEK GUIDE TEST STRIPS   ibuprofen (ADVIL) 200 MG tablet Take 400 mg by mouth at bedtime.   insulin degludec (TRESIBA FLEXTOUCH) 100 UNIT/ML FlexTouch Pen INJECT 40 UNITS INTO THE SKIN AT BEDTIME (Patient taking differently: Inject 40 Units into the skin at bedtime.)   loratadine (CLARITIN) 10 MG tablet Take 10 mg by mouth daily.   losartan (COZAAR) 25 MG tablet Take 25 mg by mouth at bedtime.    rosuvastatin (CRESTOR) 10 MG tablet Take 10 mg by mouth at bedtime.    trolamine salicylate (ASPERCREME) 10 % cream Apply 1  application topically as needed (knee pain).   [DISCONTINUED] Inulin (PHILLIPS FIBER GOOD PO) Take 2 capsules by mouth daily.   [DISCONTINUED] ipratropium (ATROVENT) 0.03 % nasal spray Place 1 spray into both nostrils 2 (two) times daily as needed for allergies.   [DISCONTINUED] polyethylene glycol-electrolytes (NULYTELY) 420 g solution As directed   No facility-administered encounter medications on file as of 01/21/2021.   ALLERGIES: Allergies  Allergen Reactions   Ivp Dye [Iodinated Diagnostic Agents] Itching    Turns red   Erythromycin Palpitations   VACCINATION STATUS:  There is no immunization history on file for this patient.  Diabetes She presents for her follow-up diabetic visit. She has type 2 diabetes mellitus. Onset time: She was diagnosed at approximate age of 66 years. Her disease course has been stable. There are no hypoglycemic associated symptoms. Pertinent negatives for hypoglycemia include no confusion, headaches, pallor or seizures. There are no diabetic associated symptoms. Pertinent negatives for diabetes include no chest pain, no polydipsia, no polyphagia, no polyuria and no weight loss. There are no hypoglycemic complications. Symptoms are stable. There are no diabetic complications. Risk factors for coronary artery disease include diabetes mellitus, dyslipidemia, hypertension, obesity and sedentary lifestyle. Current diabetic treatment includes insulin injections. She is compliant with treatment most of the time. Her weight is stable. She is following a diabetic and generally healthy diet. When asked about meal planning, she reported none. She has had a previous visit with a dietitian. She rarely participates in exercise. Her home blood glucose trend is fluctuating minimally. Her breakfast blood glucose range is generally 110-130 mg/dl. (She presents today with her meter and logs showing at goal fasting glycemic profile.  Her POCT A1c today is 6.8%, essentially unchanged  from previous visit.  She denies having hypoglycemia and is trying to slowly pull back on the amount of insulin she is taking.) An ACE inhibitor/angiotensin II receptor blocker is being taken. She does not see a podiatrist.Eye exam is current.  Hyperlipidemia This is a chronic problem. The current episode started more than 1 year ago. The problem is controlled. Recent lipid tests were reviewed and are normal. Exacerbating diseases include diabetes and obesity. There are no known factors aggravating her hyperlipidemia. Pertinent negatives include no chest pain, myalgias or shortness of breath. Current antihyperlipidemic treatment includes statins. The current treatment provides moderate improvement of lipids. There are no compliance problems.  Risk factors for coronary artery disease include dyslipidemia, diabetes mellitus, hypertension, obesity, a sedentary lifestyle and post-menopausal.  Hypertension This is a chronic problem. The current episode started more than 1 year ago. The problem has been gradually improving since onset. The problem is controlled. Pertinent  negatives include no chest pain, headaches, palpitations or shortness of breath. There are no associated agents to hypertension. Risk factors for coronary artery disease include dyslipidemia, diabetes mellitus, sedentary lifestyle and post-menopausal state. Past treatments include angiotensin blockers. The current treatment provides moderate improvement. There are no compliance problems.     Review of systems  Constitutional: + Minimally fluctuating body weight,  current Body mass index is 36.34 kg/m. , no fatigue, no subjective hyperthermia, no subjective hypothermia Eyes: no blurry vision, no xerophthalmia ENT: no sore throat, no nodules palpated in throat, no dysphagia/odynophagia, no hoarseness Cardiovascular: no chest pain, no shortness of breath, no palpitations, no leg swelling Respiratory: no cough, no shortness of  breath Gastrointestinal: no nausea/vomiting/diarrhea Musculoskeletal: no muscle/joint aches Skin: no rashes, no hyperemia Neurological: no tremors, no numbness, no tingling, no dizziness Psychiatric: no depression, no anxiety    Objective:    BP 123/77   Pulse 81   Ht 5' 7" (1.702 m)   Wt 232 lb (105.2 kg)   BMI 36.34 kg/m   Wt Readings from Last 3 Encounters:  01/21/21 232 lb (105.2 kg)  11/19/20 225 lb (102.1 kg)  09/21/20 233 lb 12.8 oz (106.1 kg)    BP Readings from Last 3 Encounters:  01/21/21 123/77  11/19/20 (!) 94/54  09/21/20 116/71    Physical Exam- Limited  Constitutional:  Body mass index is 36.34 kg/m. , not in acute distress, normal state of mind Eyes:  EOMI, no exophthalmos Neck: Supple Cardiovascular: RRR, no murmurs, rubs, or gallops, no edema Respiratory: Adequate breathing efforts, no crackles, rales, rhonchi, or wheezing Musculoskeletal: no gross deformities, strength intact in all four extremities, no gross restriction of joint movements Skin:  no rashes, no hyperemia Neurological: no tremor with outstretched hands    CMP     Component Value Date/Time   NA 143 09/01/2020 0802   NA 144 03/27/2019 0933   K 4.1 09/01/2020 0802   K 3.8 03/27/2019 0933   CL 108 09/01/2020 0802   CL 108 03/27/2019 0933   CO2 25 09/01/2020 0802   CO2 29 03/27/2019 0933   GLUCOSE 190 (H) 09/01/2020 0802   BUN 13 09/01/2020 0802   BUN 15 11/04/2019 0000   CREATININE 0.84 09/01/2020 0802   CREATININE 0.74 04/27/2020 1006   CALCIUM 9.2 09/01/2020 0802   CALCIUM 9.6 03/27/2019 0933   PROT 5.9 (L) 04/27/2020 1006   PROT 5.9 (A) 03/27/2019 0933   ALBUMIN 4.2 11/04/2019 0000   ALBUMIN 4.2 03/27/2019 0933   AST 19 04/27/2020 1006   AST 19 03/27/2019 0933   ALT 24 04/27/2020 1006   ALT 26 03/27/2019 0933   ALKPHOS 62 03/27/2019 0933   BILITOT 0.6 04/27/2020 1006   BILITOT 0.5 03/27/2019 0933   GFRNONAA >60 09/01/2020 0802   GFRNONAA 79 04/27/2020 1006    GFRAA 92 04/27/2020 1006    Diabetic Labs (most recent): Lab Results  Component Value Date   HGBA1C 6.9 09/21/2020   HGBA1C 7.1 (A) 05/01/2020   HGBA1C 6.9 11/04/2019   Lipid Panel     Component Value Date/Time   CHOL 157 04/27/2020 1006   CHOL 166 03/27/2019 0933   TRIG 144 04/27/2020 1006   TRIG 183 (A) 03/27/2019 0933   HDL 55 04/27/2020 1006   HDL 43 03/27/2019 0933   CHOLHDL 2.9 04/27/2020 1006   VLDL 22 05/12/2016 0917   LDLCALC 78 04/27/2020 1006    Assessment & Plan:   1) Uncontrolled type 2 diabetes  mellitus with complication, with long-term current use of insulin ([place])  - She remains at a high risk for more acute and chronic complications of diabetes which include CAD, CVA, CKD, retinopathy, and neuropathy. These are all discussed in detail with the patient.  She presents today with her meter and logs showing at goal fasting glycemic profile.  Her POCT A1c today is 6.8%, essentially unchanged from previous visit.  She denies having hypoglycemia and is trying to slowly pull back on the amount of insulin she is taking.  Recent labs reviewed.  - Nutritional counseling repeated at each appointment due to patients tendency to fall back in to old habits.  - The patient admits there is a room for improvement in their diet and drink choices. -  Suggestion is made for the patient to avoid simple carbohydrates from their diet including Cakes, Sweet Desserts / Pastries, Ice Cream, Soda (diet and regular), Sweet Tea, Candies, Chips, Cookies, Sweet Pastries, Store Bought Juices, Alcohol in Excess of 1-2 drinks a day, Artificial Sweeteners, Coffee Creamer, and "Sugar-free" Products. This will help patient to have stable blood glucose profile and potentially avoid unintended weight gain.   - I encouraged the patient to switch to unprocessed or minimally processed complex starch and increased protein intake (animal or plant source), fruits, and vegetables.   - Patient is advised to  stick to a routine mealtimes to eat 3 meals a day and avoid unnecessary snacks (to snack only to correct hypoglycemia).  - I have approached patient with the following individualized plan to manage diabetes and patient agrees.  -Based on her stable glycemic profile, she is advised to lower her dose of Tresiba slightly to 30 units SQ nightly and continue Trulicity 1.5 mg SQ weekly.   -She is encouraged to continue monitoring blood glucose twice daily, before breakfast and before bed, and to call the clinic if she has readings less than 70 or greater than 200 for 3 tests in a row.  -She has history of intolerance to metformin.   - Patient specific target  for A1c; LDL, HDL, Triglycerides, and  Waist Circumference were discussed in detail.  2) BP/HTN:  Her blood pressure is controlled to target.  She is encouraged to continue Losartan 25 mg p.o. daily at bedtime.  3) Lipids/HPL:  Her most recent lipid panel from 04/27/20 shows controlled LDL at 78.  She is advised to continue Crestor 10 mg po daily at bedtime.  Side effects and precautions discussed with her.  She recently had labs done at her PCP.  I have requested she bring a copy to her next appointment for our records.  4)  Weight/Diet:  Patient does not feel like she would benefit from a CDE consult.  Exercise, and carbohydrates information provided.  5) Vitamin D deficiency: Her most recent vitamin D level on 04/27/20 was 26.  She is currently taking Vitamin D3 5000 units daily.    6) Chronic Care/Health Maintenance: -Patient is on ACEI/ARB and Statin medications and encouraged to continue to follow up with Ophthalmology, Podiatrist at least yearly or according to recommendations, and advised to  stay away from smoking. I have recommended yearly flu vaccine and pneumonia vaccination at least every 5 years; moderate intensity exercise for up to 150 minutes weekly; and  sleep for at least 7 hours a day.  - I advised patient to maintain  close follow up with Practice, [facility] for primary care needs.      I spent 30 minutes  in the care of the patient today including review of labs from [place], Lipids, Thyroid Function, Hematology (current and previous including abstractions from other facilities); face-to-face time discussing  her blood glucose readings/logs, discussing hypoglycemia and hyperglycemia episodes and symptoms, medications doses, her options of short and long term treatment based on the latest standards of care / guidelines;  discussion about incorporating lifestyle medicine;  and documenting the encounter.    Please refer to Patient Instructions for Blood Glucose Monitoring and Insulin/Medications Dosing Guide"  in media tab for additional information. Please  also refer to " Patient Self Inventory" in the Media  tab for reviewed elements of pertinent patient history.  Amanda Hopkins participated in the discussions, expressed understanding, and voiced agreement with the above plans.  All questions were answered to her satisfaction. she is encouraged to contact clinic should she have any questions or concerns prior to her return visit.    Follow up plan: -Return in about 4 months (around 05/23/2021) for Diabetes F/U- A1c and UM in office, Previsit labs, Bring meter and logs.   Rayetta Pigg, [facility] [address] [place], [place] [address] Phone: [phone] Fax: [phone]   01/21/2021, 11:06 AM

## 2021-01-21 NOTE — Patient Instructions (Signed)

## 2021-01-25 DIAGNOSIS — M25562 Pain in left knee: Secondary | ICD-10-CM | POA: Diagnosis not present

## 2021-01-26 ENCOUNTER — Other Ambulatory Visit: Payer: Self-pay | Admitting: "Endocrinology

## 2021-01-28 DIAGNOSIS — I10 Essential (primary) hypertension: Secondary | ICD-10-CM | POA: Diagnosis not present

## 2021-01-28 DIAGNOSIS — Z6837 Body mass index (BMI) 37.0-37.9, adult: Secondary | ICD-10-CM | POA: Diagnosis not present

## 2021-01-28 DIAGNOSIS — E119 Type 2 diabetes mellitus without complications: Secondary | ICD-10-CM | POA: Diagnosis not present

## 2021-01-28 DIAGNOSIS — Z79899 Other long term (current) drug therapy: Secondary | ICD-10-CM | POA: Diagnosis not present

## 2021-01-28 DIAGNOSIS — M1990 Unspecified osteoarthritis, unspecified site: Secondary | ICD-10-CM | POA: Diagnosis not present

## 2021-01-28 DIAGNOSIS — G8929 Other chronic pain: Secondary | ICD-10-CM | POA: Diagnosis not present

## 2021-01-28 DIAGNOSIS — E785 Hyperlipidemia, unspecified: Secondary | ICD-10-CM | POA: Diagnosis not present

## 2021-01-28 DIAGNOSIS — Z794 Long term (current) use of insulin: Secondary | ICD-10-CM | POA: Diagnosis not present

## 2021-02-11 ENCOUNTER — Telehealth: Payer: Self-pay | Admitting: Nurse Practitioner

## 2021-02-11 DIAGNOSIS — IMO0002 Reserved for concepts with insufficient information to code with codable children: Secondary | ICD-10-CM

## 2021-02-11 DIAGNOSIS — E1165 Type 2 diabetes mellitus with hyperglycemia: Secondary | ICD-10-CM

## 2021-02-11 MED ORDER — TRESIBA FLEXTOUCH 100 UNIT/ML ~~LOC~~ SOPN: 30.0000 [IU] | PEN_INJECTOR | Freq: Every day | SUBCUTANEOUS | 3 refills | Status: AC

## 2021-02-11 NOTE — Telephone Encounter (Signed)
Pt is calling and wants her medication sent in for a 3 month supply because it is cheaper. They are showing just 3 refills. Im not sure how this needs to be sent.  insulin degludec (TRESIBA FLEXTOUCH) 100 UNIT/ML FlexTouch   [facility] [place], [place] - [place] Phone:  [phone]  Fax:  [phone]

## 2021-03-10 DIAGNOSIS — E119 Type 2 diabetes mellitus without complications: Secondary | ICD-10-CM | POA: Diagnosis not present

## 2021-03-26 DIAGNOSIS — E039 Hypothyroidism, unspecified: Secondary | ICD-10-CM | POA: Diagnosis not present

## 2021-03-26 DIAGNOSIS — E782 Mixed hyperlipidemia: Secondary | ICD-10-CM | POA: Diagnosis not present

## 2021-03-26 DIAGNOSIS — E119 Type 2 diabetes mellitus without complications: Secondary | ICD-10-CM | POA: Diagnosis not present

## 2021-03-26 DIAGNOSIS — I10 Essential (primary) hypertension: Secondary | ICD-10-CM | POA: Diagnosis not present

## 2021-03-26 DIAGNOSIS — E7849 Other hyperlipidemia: Secondary | ICD-10-CM | POA: Diagnosis not present

## 2021-03-31 DIAGNOSIS — Z0001 Encounter for general adult medical examination with abnormal findings: Secondary | ICD-10-CM | POA: Diagnosis not present

## 2021-03-31 DIAGNOSIS — E7849 Other hyperlipidemia: Secondary | ICD-10-CM | POA: Diagnosis not present

## 2021-03-31 DIAGNOSIS — R319 Hematuria, unspecified: Secondary | ICD-10-CM | POA: Diagnosis not present

## 2021-03-31 DIAGNOSIS — Z6836 Body mass index (BMI) 36.0-36.9, adult: Secondary | ICD-10-CM | POA: Diagnosis not present

## 2021-03-31 DIAGNOSIS — Z23 Encounter for immunization: Secondary | ICD-10-CM | POA: Diagnosis not present

## 2021-03-31 DIAGNOSIS — Z13820 Encounter for screening for osteoporosis: Secondary | ICD-10-CM | POA: Diagnosis not present

## 2021-03-31 DIAGNOSIS — E119 Type 2 diabetes mellitus without complications: Secondary | ICD-10-CM | POA: Diagnosis not present

## 2021-03-31 DIAGNOSIS — M25562 Pain in left knee: Secondary | ICD-10-CM | POA: Diagnosis not present

## 2021-04-26 DIAGNOSIS — M1712 Unilateral primary osteoarthritis, left knee: Secondary | ICD-10-CM | POA: Diagnosis not present

## 2021-06-09 DIAGNOSIS — J0100 Acute maxillary sinusitis, unspecified: Secondary | ICD-10-CM | POA: Diagnosis not present

## 2021-06-09 DIAGNOSIS — Z6835 Body mass index (BMI) 35.0-35.9, adult: Secondary | ICD-10-CM | POA: Diagnosis not present

## 2021-07-05 DIAGNOSIS — Z1231 Encounter for screening mammogram for malignant neoplasm of breast: Secondary | ICD-10-CM | POA: Diagnosis not present

## 2021-07-13 DIAGNOSIS — D485 Neoplasm of uncertain behavior of skin: Secondary | ICD-10-CM | POA: Diagnosis not present

## 2021-07-13 DIAGNOSIS — L280 Lichen simplex chronicus: Secondary | ICD-10-CM | POA: Diagnosis not present

## 2021-07-13 DIAGNOSIS — L570 Actinic keratosis: Secondary | ICD-10-CM | POA: Diagnosis not present

## 2021-07-26 ENCOUNTER — Ambulatory Visit: Payer: Medicare PPO | Admitting: Nurse Practitioner

## 2021-07-29 DIAGNOSIS — M1712 Unilateral primary osteoarthritis, left knee: Secondary | ICD-10-CM | POA: Diagnosis not present

## 2021-08-02 DIAGNOSIS — L570 Actinic keratosis: Secondary | ICD-10-CM | POA: Diagnosis not present

## 2021-08-19 DIAGNOSIS — D485 Neoplasm of uncertain behavior of skin: Secondary | ICD-10-CM | POA: Diagnosis not present

## 2021-08-19 DIAGNOSIS — D1722 Benign lipomatous neoplasm of skin and subcutaneous tissue of left arm: Secondary | ICD-10-CM | POA: Diagnosis not present

## 2021-09-24 DIAGNOSIS — E039 Hypothyroidism, unspecified: Secondary | ICD-10-CM | POA: Diagnosis not present

## 2021-09-24 DIAGNOSIS — E7849 Other hyperlipidemia: Secondary | ICD-10-CM | POA: Diagnosis not present

## 2021-09-24 DIAGNOSIS — E119 Type 2 diabetes mellitus without complications: Secondary | ICD-10-CM | POA: Diagnosis not present

## 2021-09-24 DIAGNOSIS — I10 Essential (primary) hypertension: Secondary | ICD-10-CM | POA: Diagnosis not present

## 2021-09-24 DIAGNOSIS — E782 Mixed hyperlipidemia: Secondary | ICD-10-CM | POA: Diagnosis not present

## 2021-09-28 DIAGNOSIS — Z6835 Body mass index (BMI) 35.0-35.9, adult: Secondary | ICD-10-CM | POA: Diagnosis not present

## 2021-09-28 DIAGNOSIS — I10 Essential (primary) hypertension: Secondary | ICD-10-CM | POA: Diagnosis not present

## 2021-09-28 DIAGNOSIS — E1169 Type 2 diabetes mellitus with other specified complication: Secondary | ICD-10-CM | POA: Diagnosis not present

## 2021-09-28 DIAGNOSIS — M25562 Pain in left knee: Secondary | ICD-10-CM | POA: Diagnosis not present

## 2021-09-28 DIAGNOSIS — E119 Type 2 diabetes mellitus without complications: Secondary | ICD-10-CM | POA: Diagnosis not present

## 2021-09-28 DIAGNOSIS — R319 Hematuria, unspecified: Secondary | ICD-10-CM | POA: Diagnosis not present

## 2021-10-12 DIAGNOSIS — M810 Age-related osteoporosis without current pathological fracture: Secondary | ICD-10-CM | POA: Diagnosis not present

## 2021-10-12 DIAGNOSIS — Z780 Asymptomatic menopausal state: Secondary | ICD-10-CM | POA: Diagnosis not present

## 2021-11-22 ENCOUNTER — Other Ambulatory Visit: Payer: Self-pay | Admitting: Nurse Practitioner

## 2022-01-20 DIAGNOSIS — D485 Neoplasm of uncertain behavior of skin: Secondary | ICD-10-CM | POA: Diagnosis not present

## 2022-01-20 DIAGNOSIS — L820 Inflamed seborrheic keratosis: Secondary | ICD-10-CM | POA: Diagnosis not present

## 2022-02-24 DIAGNOSIS — J329 Chronic sinusitis, unspecified: Secondary | ICD-10-CM | POA: Diagnosis not present

## 2022-02-24 DIAGNOSIS — Z6835 Body mass index (BMI) 35.0-35.9, adult: Secondary | ICD-10-CM | POA: Diagnosis not present

## 2022-02-24 DIAGNOSIS — J40 Bronchitis, not specified as acute or chronic: Secondary | ICD-10-CM | POA: Diagnosis not present

## 2022-03-14 DIAGNOSIS — E119 Type 2 diabetes mellitus without complications: Secondary | ICD-10-CM | POA: Diagnosis not present

## 2022-03-17 DIAGNOSIS — R918 Other nonspecific abnormal finding of lung field: Secondary | ICD-10-CM | POA: Diagnosis not present

## 2022-03-17 DIAGNOSIS — R9389 Abnormal findings on diagnostic imaging of other specified body structures: Secondary | ICD-10-CM | POA: Diagnosis not present

## 2022-03-29 DIAGNOSIS — E041 Nontoxic single thyroid nodule: Secondary | ICD-10-CM | POA: Diagnosis not present

## 2022-04-04 ENCOUNTER — Other Ambulatory Visit (HOSPITAL_COMMUNITY): Payer: Self-pay | Admitting: Physician Assistant

## 2022-04-04 DIAGNOSIS — E1169 Type 2 diabetes mellitus with other specified complication: Secondary | ICD-10-CM | POA: Diagnosis not present

## 2022-04-04 DIAGNOSIS — E7849 Other hyperlipidemia: Secondary | ICD-10-CM | POA: Diagnosis not present

## 2022-04-04 DIAGNOSIS — E041 Nontoxic single thyroid nodule: Secondary | ICD-10-CM

## 2022-04-04 DIAGNOSIS — E7801 Familial hypercholesterolemia: Secondary | ICD-10-CM | POA: Diagnosis not present

## 2022-04-04 DIAGNOSIS — I10 Essential (primary) hypertension: Secondary | ICD-10-CM | POA: Diagnosis not present

## 2022-04-06 DIAGNOSIS — E7801 Familial hypercholesterolemia: Secondary | ICD-10-CM | POA: Diagnosis not present

## 2022-04-06 DIAGNOSIS — Z6835 Body mass index (BMI) 35.0-35.9, adult: Secondary | ICD-10-CM | POA: Diagnosis not present

## 2022-04-06 DIAGNOSIS — Z0001 Encounter for general adult medical examination with abnormal findings: Secondary | ICD-10-CM | POA: Diagnosis not present

## 2022-04-06 DIAGNOSIS — H43813 Vitreous degeneration, bilateral: Secondary | ICD-10-CM | POA: Diagnosis not present

## 2022-04-06 DIAGNOSIS — E1169 Type 2 diabetes mellitus with other specified complication: Secondary | ICD-10-CM | POA: Diagnosis not present

## 2022-04-06 DIAGNOSIS — M25562 Pain in left knee: Secondary | ICD-10-CM | POA: Diagnosis not present

## 2022-04-06 DIAGNOSIS — E041 Nontoxic single thyroid nodule: Secondary | ICD-10-CM | POA: Diagnosis not present

## 2022-04-06 DIAGNOSIS — I10 Essential (primary) hypertension: Secondary | ICD-10-CM | POA: Diagnosis not present

## 2022-04-06 DIAGNOSIS — I700 Atherosclerosis of aorta: Secondary | ICD-10-CM | POA: Diagnosis not present

## 2022-04-14 ENCOUNTER — Encounter: Payer: Self-pay | Admitting: General Practice

## 2022-04-14 NOTE — Progress Notes (Signed)
Amanda Edouard, MD  Allen Kell, NT I do not see a nodule that meets TI-RADS criteria for FNA. We need copy of report of this thyroid US to review then we will make a recommendation as to whether we agree with the interpretation.  GY       Previous Messages    ----- Message ----- From: Allen Kell, NT Sent: 04/14/2022   1:18 PM EST To: Ir Procedure Requests Subject: [place] FNA BX THYROID 1ST LESION AFIRMA            Procedure: [place] FNA BX THYROID 1ST LESION AFIRMA  Reason: Thyroid Nodule  History: [place] Tyroid in PACS  Provider: Wanita Chamberlain, PA-C  Contact: [phone]

## 2022-04-18 ENCOUNTER — Encounter: Payer: Self-pay | Admitting: General Practice

## 2022-04-18 NOTE — Progress Notes (Signed)
Arne Cleveland, MD  Allen Kell, NT Ok  [place] FNA L isthmic 3.1cm EX5  DDH       Previous Messages    ----- Message ----- From: Allen Kell, NT Sent: 04/18/2022   2:06 PM EST To: Ir Procedure Requests Subject: [place] FNA BX THYROID 1ST LESION AFIRMA            Procedure: [place] FNA BX THYROID 1ST LESION AFIRMA  Reason: [place] in chart under media radiology order 04/14/22  History: US Thyroid  Provider:  Wanita Chamberlain, PA-C  Contact: [phone]

## 2022-04-25 ENCOUNTER — Other Ambulatory Visit: Payer: Self-pay | Admitting: "Endocrinology

## 2022-04-26 ENCOUNTER — Encounter (HOSPITAL_COMMUNITY): Payer: Self-pay

## 2022-04-26 ENCOUNTER — Ambulatory Visit (HOSPITAL_COMMUNITY): Admission: RE | Admit: 2022-04-26 | Payer: Medicare PPO | Source: Ambulatory Visit

## 2022-04-28 ENCOUNTER — Ambulatory Visit (HOSPITAL_COMMUNITY)
Admission: RE | Admit: 2022-04-28 | Discharge: 2022-04-28 | Disposition: A | Discharge status: Home / Self Care | Payer: Medicare PPO | Source: Ambulatory Visit | Attending: Physician Assistant | Admitting: Physician Assistant

## 2022-04-28 ENCOUNTER — Encounter (HOSPITAL_COMMUNITY): Payer: Self-pay

## 2022-04-28 DIAGNOSIS — E041 Nontoxic single thyroid nodule: Secondary | ICD-10-CM

## 2022-04-28 NOTE — Progress Notes (Signed)
Thyroid Biopsy complete no signs of distress.

## 2022-04-29 LAB — CYTOLOGY - NON PAP

## 2022-05-06 ENCOUNTER — Ambulatory Visit: Payer: Medicare PPO | Admitting: Nurse Practitioner

## 2022-05-06 ENCOUNTER — Encounter: Payer: Self-pay | Admitting: Nurse Practitioner

## 2022-05-06 VITALS — BP 110/70 | HR 82 | Ht 67.0 in | Wt 230.2 lb

## 2022-05-06 DIAGNOSIS — E041 Nontoxic single thyroid nodule: Secondary | ICD-10-CM

## 2022-05-06 NOTE — Progress Notes (Signed)
Endocrinology Follow Up Note 05/06/22    ---------------------------------------------------------------------------------------------------------------------- Subjective    Past Medical History:  Diagnosis Date   Arthritis    Diabetes mellitus without complication (HCC)    Hyperlipidemia    Hypertension    patient denies. States she is on losartan for kidney protection.    Obesity    Palpitations    PONV (postoperative nausea and vomiting)    Reflux esophagitis     Past Surgical History:  Procedure Laterality Date   ABDOMINAL HYSTERECTOMY     APPENDECTOMY     BIOPSY  11/19/2020   Procedure: BIOPSY;  Surgeon: Rourk, Robert M, MD;  Location: [facility];  Service: Endoscopy;;   CATARACT EXTRACTION Left    CATARACT EXTRACTION W/PHACO Right 11/18/2013   Procedure: CATARACT EXTRACTION PHACO AND INTRAOCULAR LENS PLACEMENT (IOC);  Surgeon: Kerry Hunt, MD;  Location: [facility];  Service: Ophthalmology;  Laterality: Right;  CDE:  7.76   CESAREAN SECTION     x2   CHEST WALL BIOPSY Right    removal of fatty tumor   COLONOSCOPY  11/08/2004   RMR:diminutive rectosigmoid polyp/left side diverticula   COLONOSCOPY  12/2009   RMR: left-sided diverticula, long tortuous colon   COLONOSCOPY  2003   RMR: carcinoma in situ   COLONOSCOPY N/A 04/07/2015    one 6 mm polyp and one 4 mm polyp, scattered left-sided diverticula, redundant colon.  Pathology with benign colonic mucosa.  Recommended repeat colonoscopy in 5 years   COLONOSCOPY WITH PROPOFOL N/A 11/19/2020   Procedure: COLONOSCOPY WITH PROPOFOL;  Surgeon: Rourk, Robert M, MD;  Location: [facility];  Service: Endoscopy;  Laterality: N/A;  10:00am   POLYPECTOMY  11/19/2020   Procedure: POLYPECTOMY;  Surgeon: Rourk, Robert M, MD;  Location: [facility];  Service: Endoscopy;;    Social History   Socioeconomic History   Marital status: Married    Spouse name: Not on file   Number of children: 2   Years of education: Not on file    Highest education level: Not on file  Occupational History   Not on file  Tobacco Use   Smoking status: Never   Smokeless tobacco: Never  Vaping Use   Vaping Use: Never used  Substance and Sexual Activity   Alcohol use: No   Drug use: No   Sexual activity: Yes    Birth control/protection: Surgical  Other Topics Concern   Not on file  Social History Narrative   Not on file   Social Determinants of Health   Financial Resource Strain: Not on file  Food Insecurity: Not on file  Transportation Needs: Not on file  Physical Activity: Not on file  Stress: Not on file  Social Connections: Not on file  Intimate Partner Violence: Not on file    Current Outpatient Medications on File Prior to Visit  Medication Sig Dispense Refill   Accu-Chek Softclix Lancets lancets USE 1  TO CHECK GLUCOSE TWICE DAILY 200 each 0   ARTIFICIAL TEAR SOLUTION OP Place 1 drop into both eyes daily as needed (dry eyes).     B-D UF III MINI PEN NEEDLES 31G X 5 MM MISC USE ONCE DAILY AS DIRECTED 100 each 2   Blood Glucose Monitoring Suppl (ACCU-CHEK GUIDE) w/Device KIT 1 each by Does not apply route 2 (two) times daily. Use as directed to check blood glucose two times daily. 1 kit 0   Cholecalciferol (DIALYVITE VITAMIN D 5000) 125 MCG (5000 UT) capsule Take 5,000 Units by   mouth daily.     Dulaglutide (TRULICITY) 1.5 HW/2.9HB SOPN Inject 1.5 mg into the skin every Monday. 6 mL 3   glucose blood test strip 1 each by Other route 2 (two) times daily. Use as instructed to test blood glucose two times daily ACCU-CHEK GUIDE TEST STRIPS 200 each 0   ibuprofen (ADVIL) 200 MG tablet Take 400 mg by mouth at bedtime.     insulin degludec (TRESIBA FLEXTOUCH) 100 UNIT/ML FlexTouch Pen Inject 30 Units into the skin at bedtime. 36 mL 3   loratadine (CLARITIN) 10 MG tablet Take 10 mg by mouth daily.     losartan (COZAAR) 25 MG tablet Take 25 mg by mouth at bedtime.      rosuvastatin (CRESTOR) 10 MG tablet Take 10 mg by mouth at  bedtime.      trolamine salicylate (ASPERCREME) 10 % cream Apply 1 application topically as needed (knee pain).     No current facility-administered medications on file prior to visit.      HPI   Amanda Hopkins is a 76 y.o.-year-old female, referred by her PCP, Launa Grill, PA, for evaluation for multinodular goiter.  Thyroid U/S:03/29/22  Clinical history: thyroid nodule (seen on CT chest)  Findings: Right lobe measures 4.1 x 1.5 x 1.3 cm.  Homogenous echotexture. Normal flow.  Multiple nodules noted. Largest is 1.2 cm x 1.1 x 1.1 cm TR3.  Left lobe measures 4.1 x 1.1 x 1.8 cm.  Homogenous echotexture.  Normal flow.  Multiple nodules largest is 1.2 cm TR3.  Isthmus: Mostly nodule measuring 3.1 x 1.7 x 3.2 cm nodule the isthmus TR3.  No adenopathy.  Comparison: None  Impression: Numerous thyroid nodules. Dominant nodule in the isthmus is 3.2 cm, consider FNA.   I reviewed pt's thyroid tests: Lab Results  Component Value Date   TSH 0.98 11/04/2019   TSH 0.88 03/27/2019   TSH 0.88 03/27/2019   TSH 0.49 01/16/2018   TSH 0.24 (L) 03/13/2017   TSH 0.48 02/05/2016   FREET4 1.3 03/13/2017   FREET4 1.3 02/05/2016     Patient does note she must clear her throat quite often but she attributed that symptoms to PND.  She denies any compressive symptoms such as choking, difficulty swallowing, difficulty breathing, etc.  No FH of thyroid ds. No FH of thyroid cancer. No h/o radiation tx to head or neck.  No seaweed or kelp. No recent contrast studies. No steroid use. No herbal supplements. No Biotin supplements or Hair, Skin and Nails vitamins.  Pt also has a history of DM.  Review of systems  Constitutional: + Minimally fluctuating body weight,  current Body mass index is 36.05 kg/m. , no fatigue, no subjective hyperthermia, no subjective hypothermia Eyes: no blurry vision, no xerophthalmia ENT: no sore throat, no nodules palpated in throat, no dysphagia/odynophagia, no  hoarseness Cardiovascular: no chest pain, no shortness of breath, no palpitations, no leg swelling Respiratory: no cough, no shortness of breath Gastrointestinal: no nausea/vomiting/diarrhea Musculoskeletal: no muscle/joint aches Skin: no rashes, no hyperemia Neurological: no tremors, no numbness, no tingling, no dizziness Psychiatric: no depression, no anxiety  ---------------------------------------------------------------------------------------------------------------------- Objective    BP 110/70 (BP Location: Left Arm, Patient Position: Sitting, Cuff Size: Large)   Pulse 82   Ht 5' 7" (1.702 m)   Wt 230 lb 3.2 oz (104.4 kg)   BMI 36.05 kg/m    BP Readings from Last 3 Encounters:  05/06/22 110/70  04/28/22 (!) 148/78  01/21/21 123/77    Wt  Readings from Last 3 Encounters:  05/06/22 230 lb 3.2 oz (104.4 kg)  01/21/21 232 lb (105.2 kg)  11/19/20 225 lb (102.1 kg)     Physical Exam- Limited  Constitutional:  Body mass index is 36.05 kg/m. , not in acute distress, normal state of mind Eyes:  EOMI, no exophthalmos Neck: Supple Thyroid: No gross goiter, bruising from recent FNA to mid clavicular region Musculoskeletal: no gross deformities, strength intact in all four extremities, no gross restriction of joint movements Skin:  no rashes, no hyperemia Neurological: no tremor with outstretched hands    FNA results 04/28/22  CYTOLOGY - NON PAP CASE: [id number] PATIENT: Amanda Hopkins Non-Gynecological Cytology Report     Clinical History: 3.1 cm Isthmus, mid; ACR TI-RADS risk category: TR3 (3 pts) Specimen Submitted:  A. THYROID, ISTHMUS, FINE NEEDLE ASPIRATION:   FINAL MICROSCOPIC DIAGNOSIS: - Scant follicular epithelium present (Bethesda category I)  SPECIMEN ADEQUACY: Satisfactory but limited for evaluation, partially obscuring blood  GROSS: Received is/are 6 slides in 95% Ethyl alcohol, and 30 ccs of pink Cytolyt solution.  (CM:cm) Prepared: Smears:  6 Concentration Method (Thin Prep):  1 Cell Block:  Cell block attempted, not obtained. Additional Studies:  Also there was an Afirma collected.    ----------------------------------------------------------------------------------------------------------------------  ASSESSMENT / PLAN:  1. Thyroid Nodule  - I reviewed the images of her thyroid ultrasound along with the patient. I pointed out that the dominant nodules are large, this being a risk factor for cancer.  Otherwise, the nodules are: - not hypoechoic - without microcalcifications - without internal blood flow - more wide than tall - well delimited from surrounding tissue  Pt does not have a thyroid cancer family history or a personal history of RxTx to head/neck. All these would favor benignity.   She already had the FNA done which showed scant follicular epithelium present with some blood present, therefore malignancy cannot be ruled out.  We discussed on how to proceed from here.  We discussed repeating the biopsy now (waiting for her to completely heal from this FNA first), or waiting and performing another ultrasound in 6 months to reassess.  Ultimately, she chose the more conservative option and to perform the thyroid ultrasound again in 6 months to assess.  She knows she can change her mind at any time and I would be happy to enter the order for repeat FNA.  She did mention that if it were determined to be cancer, she would proceed with surgery for treatment.    Follow Up Plan: Return in about 6 months (around 11/05/2022) for Thyroid follow up, thyroid ultrasound.    I spent 35 minutes in the care of the patient today including review of labs from Thyroid Function, CMP, and other relevant labs ; imaging/biopsy records (current and previous including abstractions from other facilities); face-to-face time discussing  her lab results and symptoms, medications doses, her options of short and long  term treatment based on the latest standards of care / guidelines;   and documenting the encounter.  Nyajah B Emily  participated in the discussions, expressed understanding, and voiced agreement with the above plans.  All questions were answered to her satisfaction. she is encouraged to contact clinic should she have any questions or concerns prior to her return visit.    Whitney Reardon, FNP-BC [facility] [address] [place], [place] [address] Phone: [phone] Fax: [phone] 

## 2022-05-06 NOTE — Patient Instructions (Signed)
Thyroid Nodule  A thyroid nodule is an isolated growth of thyroid cells that forms a lump in the thyroid gland. The thyroid gland is a butterfly-shaped gland found in the lower front of the neck. It sends chemical messengers (hormones) through the blood to all parts of the body. These hormones are important in regulating body temperature and helping the body use energy. Thyroid nodules are common. Most are not cancerous (are benign). You may have one nodule or several nodules. There are different types of thyroid nodules. They include nodules that: Grow and fill with fluid (thyroid cysts). Produce too much thyroid hormone (hot nodules or hyperthyroid). Produce no thyroid hormone (cold nodules or hypothyroid). Form from cancer cells (thyroid cancers). What are the causes? In most cases, the cause of thyroid nodules is not known. What increases the risk? The following factors may make you more likely to develop thyroid nodules: Age. Thyroid nodules are more common in people who are older than 45 years. Female gender. A family history that includes: Thyroid nodules. Pheochromocytoma. Thyroid carcinoma. Hyperparathyroidism. Certain thyroid diseases, such as Hashimoto's thyroiditis. Lack of iodine in your diet. A history of head and neck radiation, such as from cancer treatments. Type 2 diabetes. What are the signs or symptoms? In many cases, there are no symptoms. If you have symptoms, they may include: A lump in your lower neck. Feeling pressure, fullness, or a tickle in your throat. Pain in your neck, jaw, or ear. Having trouble swallowing or breathing. Hot nodules may cause: Weight loss. Warm, flushed skin. Feeling hot. Feeling nervous. A rapid or irregular heartbeat. Cold nodules may cause: Weight gain. Dry skin. Hair loss, brittle hair, or both. Feeling cold. Fatigue. Thyroid cancer nodules may cause: Hard nodules that can be felt along the thyroid  gland. Hoarseness. Lumps in the tissue (lymph nodes) near your thyroid gland. How is this diagnosed? A thyroid nodule may be felt by your health care provider during a physical exam. This condition may also be diagnosed based on your symptoms. You may also have tests, including: Blood tests to check how well your thyroid is working. An ultrasound. This may be done to confirm the diagnosis. A biopsy. This involves taking a sample from the nodule and looking at it under a microscope. A thyroid scan. This test creates an image of the thyroid gland using a radioactive tracer. Imaging tests such as an MRI or CT scan. These may be done if: A nodule is large. A nodule is blocking your airway. Cancer is suspected. How is this treated? Treatment depends on the cause and size of your nodule or nodules. If a nodule is benign, treatment may not be necessary. Your health care provider may monitor the nodule to see if it goes away without treatment. If a nodule continues to grow, is cancerous, or does not go away, treatment may be needed. Treatment may include: Having a cystic nodule drained with a needle. Ablation therapy. In this treatment, alcohol is injected into the area of the nodule to destroy the cells. Ablation with heat may also be used. This is called thermal ablation. Radioactive iodine. In this treatment, radioactive iodine is given as a pill or liquid that you drink. This substance causes the thyroid nodule to shrink. Surgery to remove the nodule or nodules. Part or all of your thyroid gland may also need to be removed. Medicines to treat hyperthyroidism. Follow these instructions at home: Pay attention to any changes in your thyroid nodule or nodules. Take over-the-counter  and prescription medicines only as told by your health care provider. Keep all follow-up visits. This is important. Contact a health care provider if: You have trouble sleeping. You have muscle weakness. You have  significant weight loss without changing your eating habits. You feel nervous. You have trouble swallowing. You have increased swelling. You have a rapid or irregular heartbeat. Get help right away if: You have chest pain. You faint or lose consciousness. Your nodule makes it hard for you to breathe. These symptoms may be an emergency. Get help right away. Call 911. Do not wait to see if the symptoms will go away. Do not drive yourself to the hospital. Summary A thyroid nodule is an isolated growth of thyroid cells that forms a lump in your thyroid gland. Thyroid nodules are common. Most are not cancerous. Your health care provider may monitor the nodule to see if it goes away without treatment. If a nodule continues to grow, is cancerous, or does not go away, treatment may be needed. Treatment depends on the cause and size of your nodule or nodules. This information is not intended to replace advice given to you by your health care provider. Make sure you discuss any questions you have with your health care provider. Document Revised: 03/29/2021 Document Reviewed: 03/29/2021 Elsevier Patient Education  [place].

## 2022-05-19 DIAGNOSIS — M19041 Primary osteoarthritis, right hand: Secondary | ICD-10-CM | POA: Diagnosis not present

## 2022-05-19 DIAGNOSIS — R030 Elevated blood-pressure reading, without diagnosis of hypertension: Secondary | ICD-10-CM | POA: Diagnosis not present

## 2022-05-19 DIAGNOSIS — Z6835 Body mass index (BMI) 35.0-35.9, adult: Secondary | ICD-10-CM | POA: Diagnosis not present

## 2022-06-01 DIAGNOSIS — M79641 Pain in right hand: Secondary | ICD-10-CM | POA: Insufficient documentation

## 2022-06-01 DIAGNOSIS — M13842 Other specified arthritis, left hand: Secondary | ICD-10-CM | POA: Diagnosis not present

## 2022-06-01 DIAGNOSIS — M13841 Other specified arthritis, right hand: Secondary | ICD-10-CM | POA: Diagnosis not present

## 2022-06-01 DIAGNOSIS — M19049 Primary osteoarthritis, unspecified hand: Secondary | ICD-10-CM | POA: Insufficient documentation

## 2022-06-01 DIAGNOSIS — M79642 Pain in left hand: Secondary | ICD-10-CM | POA: Diagnosis not present

## 2022-07-04 DIAGNOSIS — E039 Hypothyroidism, unspecified: Secondary | ICD-10-CM | POA: Diagnosis not present

## 2022-07-04 DIAGNOSIS — E119 Type 2 diabetes mellitus without complications: Secondary | ICD-10-CM | POA: Diagnosis not present

## 2022-07-04 DIAGNOSIS — E7801 Familial hypercholesterolemia: Secondary | ICD-10-CM | POA: Diagnosis not present

## 2022-07-04 DIAGNOSIS — E1169 Type 2 diabetes mellitus with other specified complication: Secondary | ICD-10-CM | POA: Diagnosis not present

## 2022-07-04 DIAGNOSIS — I10 Essential (primary) hypertension: Secondary | ICD-10-CM | POA: Diagnosis not present

## 2022-07-04 DIAGNOSIS — E7849 Other hyperlipidemia: Secondary | ICD-10-CM | POA: Diagnosis not present

## 2022-07-12 DIAGNOSIS — E1165 Type 2 diabetes mellitus with hyperglycemia: Secondary | ICD-10-CM | POA: Diagnosis not present

## 2022-07-12 DIAGNOSIS — E7801 Familial hypercholesterolemia: Secondary | ICD-10-CM | POA: Diagnosis not present

## 2022-07-12 DIAGNOSIS — I10 Essential (primary) hypertension: Secondary | ICD-10-CM | POA: Diagnosis not present

## 2022-07-12 DIAGNOSIS — M25512 Pain in left shoulder: Secondary | ICD-10-CM | POA: Diagnosis not present

## 2022-07-12 DIAGNOSIS — M25562 Pain in left knee: Secondary | ICD-10-CM | POA: Diagnosis not present

## 2022-07-12 DIAGNOSIS — I700 Atherosclerosis of aorta: Secondary | ICD-10-CM | POA: Diagnosis not present

## 2022-07-12 DIAGNOSIS — H43813 Vitreous degeneration, bilateral: Secondary | ICD-10-CM | POA: Diagnosis not present

## 2022-07-12 DIAGNOSIS — E041 Nontoxic single thyroid nodule: Secondary | ICD-10-CM | POA: Diagnosis not present

## 2022-07-12 DIAGNOSIS — R319 Hematuria, unspecified: Secondary | ICD-10-CM | POA: Diagnosis not present

## 2022-08-01 ENCOUNTER — Other Ambulatory Visit: Payer: Self-pay | Admitting: "Endocrinology

## 2022-10-05 DIAGNOSIS — E1165 Type 2 diabetes mellitus with hyperglycemia: Secondary | ICD-10-CM | POA: Diagnosis not present

## 2022-10-07 DIAGNOSIS — Z1231 Encounter for screening mammogram for malignant neoplasm of breast: Secondary | ICD-10-CM | POA: Diagnosis not present

## 2022-10-12 DIAGNOSIS — M25512 Pain in left shoulder: Secondary | ICD-10-CM | POA: Diagnosis not present

## 2022-10-12 DIAGNOSIS — Z6834 Body mass index (BMI) 34.0-34.9, adult: Secondary | ICD-10-CM | POA: Diagnosis not present

## 2022-10-12 DIAGNOSIS — E041 Nontoxic single thyroid nodule: Secondary | ICD-10-CM | POA: Diagnosis not present

## 2022-10-12 DIAGNOSIS — R030 Elevated blood-pressure reading, without diagnosis of hypertension: Secondary | ICD-10-CM | POA: Diagnosis not present

## 2022-10-12 DIAGNOSIS — E1165 Type 2 diabetes mellitus with hyperglycemia: Secondary | ICD-10-CM | POA: Diagnosis not present

## 2022-10-31 ENCOUNTER — Ambulatory Visit (HOSPITAL_COMMUNITY): Payer: Medicare PPO

## 2022-10-31 DIAGNOSIS — E041 Nontoxic single thyroid nodule: Secondary | ICD-10-CM | POA: Diagnosis not present

## 2022-11-01 DIAGNOSIS — I129 Hypertensive chronic kidney disease with stage 1 through stage 4 chronic kidney disease, or unspecified chronic kidney disease: Secondary | ICD-10-CM | POA: Diagnosis not present

## 2022-11-01 DIAGNOSIS — I700 Atherosclerosis of aorta: Secondary | ICD-10-CM | POA: Diagnosis not present

## 2022-11-01 DIAGNOSIS — N182 Chronic kidney disease, stage 2 (mild): Secondary | ICD-10-CM | POA: Diagnosis not present

## 2022-11-01 DIAGNOSIS — M810 Age-related osteoporosis without current pathological fracture: Secondary | ICD-10-CM | POA: Diagnosis not present

## 2022-11-01 DIAGNOSIS — E1122 Type 2 diabetes mellitus with diabetic chronic kidney disease: Secondary | ICD-10-CM | POA: Diagnosis not present

## 2022-11-01 DIAGNOSIS — E785 Hyperlipidemia, unspecified: Secondary | ICD-10-CM | POA: Diagnosis not present

## 2022-11-01 DIAGNOSIS — Z8249 Family history of ischemic heart disease and other diseases of the circulatory system: Secondary | ICD-10-CM | POA: Diagnosis not present

## 2022-11-01 DIAGNOSIS — M1990 Unspecified osteoarthritis, unspecified site: Secondary | ICD-10-CM | POA: Diagnosis not present

## 2022-11-01 DIAGNOSIS — Z794 Long term (current) use of insulin: Secondary | ICD-10-CM | POA: Diagnosis not present

## 2022-11-02 ENCOUNTER — Other Ambulatory Visit: Payer: Self-pay | Admitting: Otolaryngology

## 2022-11-02 DIAGNOSIS — E041 Nontoxic single thyroid nodule: Secondary | ICD-10-CM

## 2022-11-04 NOTE — Progress Notes (Signed)
Oley Balm, MD sent to [facility] PROCEDURE / BIOPSY REVIEW Date: 11/02/22  Requested Biopsy site: thyroid isthmic nodule repeat Reason for request: FINAL MICROSCOPIC DIAGNOSIS: - Scant follicular epithelium present (Bethesda category I)  SPECIMEN ADEQUACY: Satisfactory but limited for evaluation, partially obscuring blood   Imaging review: Best seen on prev [place] bx 04/28/22  Decision: Approved Imaging modality to perform: Ultrasound Schedule with: No sedation / Local anesthetic Schedule for: Any VIR or APP  Additional comments:   Please contact me with questions, concerns, or if issue pertaining to this request arise.  Dayne Oley Balm, MD [facility]

## 2022-11-07 ENCOUNTER — Ambulatory Visit: Payer: Medicare PPO | Admitting: Nurse Practitioner

## 2022-11-10 NOTE — Progress Notes (Signed)
Patient for US guided FNA Thyroid Nodule Biopsy on Fri 11/11/2022, I called and spoke with the patient on the phone and gave pre-procedure instructions. Pt was made aware to be here at 12:30p and check in at the [facility] registration desk. Pt stated understanding.  Called 11/10/2022

## 2022-11-11 ENCOUNTER — Ambulatory Visit
Admission: RE | Admit: 2022-11-11 | Discharge: 2022-11-11 | Disposition: A | Discharge status: Home / Self Care | Payer: Medicare PPO | Source: Ambulatory Visit | Attending: Otolaryngology | Admitting: Otolaryngology

## 2022-11-11 DIAGNOSIS — E041 Nontoxic single thyroid nodule: Secondary | ICD-10-CM | POA: Diagnosis not present

## 2022-11-11 MED ORDER — LIDOCAINE HCL (PF) 1 % IJ SOLN
10.0000 mL | Freq: Once | INTRAMUSCULAR | Status: AC
  Administered 2022-11-11: 10 mL via INTRADERMAL

## 2022-11-11 NOTE — Procedures (Signed)
PROCEDURE SUMMARY:  Using direct ultrasound guidance, 7 passes were made using 25 g needles into the nodule within the isthmus of the thyroid.   Ultrasound was used to confirm needle placements on all occasions.   EBL = trace  Specimens were sent to Pathology for analysis.  See procedure note under Imaging tab in Epic for full procedure details.  Kennieth Francois PA-C 11/11/2022 2:08 PM

## 2022-11-14 ENCOUNTER — Ambulatory Visit: Payer: Medicare PPO | Admitting: Nurse Practitioner

## 2022-11-16 DIAGNOSIS — R030 Elevated blood-pressure reading, without diagnosis of hypertension: Secondary | ICD-10-CM | POA: Diagnosis not present

## 2022-11-16 DIAGNOSIS — M25572 Pain in left ankle and joints of left foot: Secondary | ICD-10-CM | POA: Diagnosis not present

## 2022-11-16 DIAGNOSIS — Z6834 Body mass index (BMI) 34.0-34.9, adult: Secondary | ICD-10-CM | POA: Diagnosis not present

## 2022-11-16 DIAGNOSIS — W19XXXA Unspecified fall, initial encounter: Secondary | ICD-10-CM | POA: Diagnosis not present

## 2022-11-16 DIAGNOSIS — R600 Localized edema: Secondary | ICD-10-CM | POA: Diagnosis not present

## 2022-11-16 DIAGNOSIS — M79672 Pain in left foot: Secondary | ICD-10-CM | POA: Diagnosis not present

## 2022-11-29 DIAGNOSIS — M25512 Pain in left shoulder: Secondary | ICD-10-CM | POA: Diagnosis not present

## 2022-11-29 DIAGNOSIS — M75102 Unspecified rotator cuff tear or rupture of left shoulder, not specified as traumatic: Secondary | ICD-10-CM | POA: Diagnosis not present

## 2022-11-29 DIAGNOSIS — G8929 Other chronic pain: Secondary | ICD-10-CM | POA: Diagnosis not present

## 2022-12-12 DIAGNOSIS — G8929 Other chronic pain: Secondary | ICD-10-CM | POA: Diagnosis not present

## 2022-12-12 DIAGNOSIS — M25512 Pain in left shoulder: Secondary | ICD-10-CM | POA: Diagnosis not present

## 2022-12-12 DIAGNOSIS — R531 Weakness: Secondary | ICD-10-CM | POA: Diagnosis not present

## 2022-12-12 DIAGNOSIS — M7502 Adhesive capsulitis of left shoulder: Secondary | ICD-10-CM | POA: Diagnosis not present

## 2022-12-15 DIAGNOSIS — G8929 Other chronic pain: Secondary | ICD-10-CM | POA: Diagnosis not present

## 2022-12-15 DIAGNOSIS — R531 Weakness: Secondary | ICD-10-CM | POA: Diagnosis not present

## 2022-12-15 DIAGNOSIS — M7502 Adhesive capsulitis of left shoulder: Secondary | ICD-10-CM | POA: Diagnosis not present

## 2022-12-15 DIAGNOSIS — M25512 Pain in left shoulder: Secondary | ICD-10-CM | POA: Diagnosis not present

## 2022-12-20 DIAGNOSIS — M7502 Adhesive capsulitis of left shoulder: Secondary | ICD-10-CM | POA: Diagnosis not present

## 2022-12-20 DIAGNOSIS — G8929 Other chronic pain: Secondary | ICD-10-CM | POA: Diagnosis not present

## 2022-12-20 DIAGNOSIS — R531 Weakness: Secondary | ICD-10-CM | POA: Diagnosis not present

## 2022-12-20 DIAGNOSIS — M25512 Pain in left shoulder: Secondary | ICD-10-CM | POA: Diagnosis not present

## 2022-12-22 DIAGNOSIS — G8929 Other chronic pain: Secondary | ICD-10-CM | POA: Diagnosis not present

## 2022-12-22 DIAGNOSIS — R531 Weakness: Secondary | ICD-10-CM | POA: Diagnosis not present

## 2022-12-22 DIAGNOSIS — M25512 Pain in left shoulder: Secondary | ICD-10-CM | POA: Diagnosis not present

## 2022-12-22 DIAGNOSIS — M7502 Adhesive capsulitis of left shoulder: Secondary | ICD-10-CM | POA: Diagnosis not present

## 2022-12-27 DIAGNOSIS — M7502 Adhesive capsulitis of left shoulder: Secondary | ICD-10-CM | POA: Diagnosis not present

## 2022-12-27 DIAGNOSIS — M25512 Pain in left shoulder: Secondary | ICD-10-CM | POA: Diagnosis not present

## 2022-12-27 DIAGNOSIS — G8929 Other chronic pain: Secondary | ICD-10-CM | POA: Diagnosis not present

## 2022-12-27 DIAGNOSIS — R531 Weakness: Secondary | ICD-10-CM | POA: Diagnosis not present

## 2022-12-29 DIAGNOSIS — R531 Weakness: Secondary | ICD-10-CM | POA: Diagnosis not present

## 2022-12-29 DIAGNOSIS — M7502 Adhesive capsulitis of left shoulder: Secondary | ICD-10-CM | POA: Diagnosis not present

## 2022-12-29 DIAGNOSIS — M25512 Pain in left shoulder: Secondary | ICD-10-CM | POA: Diagnosis not present

## 2022-12-29 DIAGNOSIS — G8929 Other chronic pain: Secondary | ICD-10-CM | POA: Diagnosis not present

## 2023-01-04 DIAGNOSIS — R531 Weakness: Secondary | ICD-10-CM | POA: Diagnosis not present

## 2023-01-04 DIAGNOSIS — M25512 Pain in left shoulder: Secondary | ICD-10-CM | POA: Diagnosis not present

## 2023-01-04 DIAGNOSIS — M7502 Adhesive capsulitis of left shoulder: Secondary | ICD-10-CM | POA: Diagnosis not present

## 2023-01-04 DIAGNOSIS — G8929 Other chronic pain: Secondary | ICD-10-CM | POA: Diagnosis not present

## 2023-01-10 DIAGNOSIS — G8929 Other chronic pain: Secondary | ICD-10-CM | POA: Diagnosis not present

## 2023-01-10 DIAGNOSIS — M7502 Adhesive capsulitis of left shoulder: Secondary | ICD-10-CM | POA: Diagnosis not present

## 2023-01-10 DIAGNOSIS — M25512 Pain in left shoulder: Secondary | ICD-10-CM | POA: Diagnosis not present

## 2023-01-10 DIAGNOSIS — R531 Weakness: Secondary | ICD-10-CM | POA: Diagnosis not present

## 2023-01-12 DIAGNOSIS — M7502 Adhesive capsulitis of left shoulder: Secondary | ICD-10-CM | POA: Diagnosis not present

## 2023-01-12 DIAGNOSIS — R531 Weakness: Secondary | ICD-10-CM | POA: Diagnosis not present

## 2023-01-12 DIAGNOSIS — G8929 Other chronic pain: Secondary | ICD-10-CM | POA: Diagnosis not present

## 2023-01-12 DIAGNOSIS — M25512 Pain in left shoulder: Secondary | ICD-10-CM | POA: Diagnosis not present

## 2023-01-17 DIAGNOSIS — M25512 Pain in left shoulder: Secondary | ICD-10-CM | POA: Diagnosis not present

## 2023-01-17 DIAGNOSIS — R531 Weakness: Secondary | ICD-10-CM | POA: Diagnosis not present

## 2023-01-17 DIAGNOSIS — G8929 Other chronic pain: Secondary | ICD-10-CM | POA: Diagnosis not present

## 2023-01-17 DIAGNOSIS — M7502 Adhesive capsulitis of left shoulder: Secondary | ICD-10-CM | POA: Diagnosis not present

## 2023-01-20 DIAGNOSIS — G8929 Other chronic pain: Secondary | ICD-10-CM | POA: Diagnosis not present

## 2023-01-20 DIAGNOSIS — M7502 Adhesive capsulitis of left shoulder: Secondary | ICD-10-CM | POA: Diagnosis not present

## 2023-01-20 DIAGNOSIS — M25512 Pain in left shoulder: Secondary | ICD-10-CM | POA: Diagnosis not present

## 2023-01-20 DIAGNOSIS — R531 Weakness: Secondary | ICD-10-CM | POA: Diagnosis not present

## 2023-02-06 DIAGNOSIS — M25562 Pain in left knee: Secondary | ICD-10-CM | POA: Diagnosis not present

## 2023-02-06 DIAGNOSIS — M1712 Unilateral primary osteoarthritis, left knee: Secondary | ICD-10-CM | POA: Diagnosis not present

## 2023-02-06 DIAGNOSIS — M25572 Pain in left ankle and joints of left foot: Secondary | ICD-10-CM | POA: Diagnosis not present

## 2023-02-08 DIAGNOSIS — M25512 Pain in left shoulder: Secondary | ICD-10-CM | POA: Diagnosis not present

## 2023-02-08 DIAGNOSIS — G8929 Other chronic pain: Secondary | ICD-10-CM | POA: Diagnosis not present

## 2023-02-08 DIAGNOSIS — M7502 Adhesive capsulitis of left shoulder: Secondary | ICD-10-CM | POA: Diagnosis not present

## 2023-02-08 DIAGNOSIS — R531 Weakness: Secondary | ICD-10-CM | POA: Diagnosis not present

## 2023-04-03 DIAGNOSIS — E7801 Familial hypercholesterolemia: Secondary | ICD-10-CM | POA: Diagnosis not present

## 2023-04-03 DIAGNOSIS — E1169 Type 2 diabetes mellitus with other specified complication: Secondary | ICD-10-CM | POA: Diagnosis not present

## 2023-04-03 DIAGNOSIS — E039 Hypothyroidism, unspecified: Secondary | ICD-10-CM | POA: Diagnosis not present

## 2023-04-03 DIAGNOSIS — E041 Nontoxic single thyroid nodule: Secondary | ICD-10-CM | POA: Diagnosis not present

## 2023-04-03 DIAGNOSIS — E1165 Type 2 diabetes mellitus with hyperglycemia: Secondary | ICD-10-CM | POA: Diagnosis not present

## 2023-04-03 DIAGNOSIS — I10 Essential (primary) hypertension: Secondary | ICD-10-CM | POA: Diagnosis not present

## 2023-04-03 DIAGNOSIS — E7849 Other hyperlipidemia: Secondary | ICD-10-CM | POA: Diagnosis not present

## 2023-04-10 DIAGNOSIS — I10 Essential (primary) hypertension: Secondary | ICD-10-CM | POA: Diagnosis not present

## 2023-04-10 DIAGNOSIS — Z794 Long term (current) use of insulin: Secondary | ICD-10-CM | POA: Diagnosis not present

## 2023-04-10 DIAGNOSIS — E7849 Other hyperlipidemia: Secondary | ICD-10-CM | POA: Diagnosis not present

## 2023-04-10 DIAGNOSIS — Z0000 Encounter for general adult medical examination without abnormal findings: Secondary | ICD-10-CM | POA: Diagnosis not present

## 2023-04-10 DIAGNOSIS — M25562 Pain in left knee: Secondary | ICD-10-CM | POA: Diagnosis not present

## 2023-04-10 DIAGNOSIS — M25512 Pain in left shoulder: Secondary | ICD-10-CM | POA: Diagnosis not present

## 2023-04-10 DIAGNOSIS — Z6833 Body mass index (BMI) 33.0-33.9, adult: Secondary | ICD-10-CM | POA: Diagnosis not present

## 2023-04-10 DIAGNOSIS — E041 Nontoxic single thyroid nodule: Secondary | ICD-10-CM | POA: Diagnosis not present

## 2023-04-10 DIAGNOSIS — E11649 Type 2 diabetes mellitus with hypoglycemia without coma: Secondary | ICD-10-CM | POA: Diagnosis not present

## 2023-05-15 DIAGNOSIS — E041 Nontoxic single thyroid nodule: Secondary | ICD-10-CM | POA: Diagnosis not present

## 2023-05-15 DIAGNOSIS — J301 Allergic rhinitis due to pollen: Secondary | ICD-10-CM | POA: Diagnosis not present

## 2023-07-06 DIAGNOSIS — H43813 Vitreous degeneration, bilateral: Secondary | ICD-10-CM | POA: Diagnosis not present

## 2023-07-13 DIAGNOSIS — I10 Essential (primary) hypertension: Secondary | ICD-10-CM | POA: Diagnosis not present

## 2023-07-13 DIAGNOSIS — E119 Type 2 diabetes mellitus without complications: Secondary | ICD-10-CM | POA: Diagnosis not present

## 2023-07-19 DIAGNOSIS — Z6834 Body mass index (BMI) 34.0-34.9, adult: Secondary | ICD-10-CM | POA: Diagnosis not present

## 2023-07-19 DIAGNOSIS — R319 Hematuria, unspecified: Secondary | ICD-10-CM | POA: Diagnosis not present

## 2023-07-19 DIAGNOSIS — R300 Dysuria: Secondary | ICD-10-CM | POA: Diagnosis not present

## 2023-07-21 DIAGNOSIS — M1712 Unilateral primary osteoarthritis, left knee: Secondary | ICD-10-CM | POA: Diagnosis not present

## 2023-07-26 DIAGNOSIS — R310 Gross hematuria: Secondary | ICD-10-CM | POA: Insufficient documentation

## 2023-07-26 NOTE — Progress Notes (Unsigned)
 Name: Amanda Hopkins DOB: 1945/09/05 MRN: [id number]  History of Present Illness: Ms. Amanda Hopkins is a 78 y.o. female who presents today as a new patient at [facility] [place].  - GU History: 1. Kidney stones. Distant history.  She reports chief complaint of persistent intermittent gross hematuria for many years.   Recent history: > 07/19/2023:  - Seen by PCP for intermittent gross hematuria.  - Negative UA & urine culture.  Today: She denies gross hematuria, urinary urgency, frequency, dysuria, straining to void, or sensations of incomplete emptying. She denies abdominal or flank pain. Denies passing any kidney stones in >10 years.  She denies history of pyelonephritis.  She denies history of recent or recurrent UTI. She denies history of GU malignancy or pelvic radiation.  She denies history of autoimmune disease. She denies history of sickle cell disease or sickle cell trait. She denies history of smoking. She denies known occupational risks. She denies taking anticoagulants.  Fall Screening: Do you usually have a device to assist in your mobility? No  Medications: Current Outpatient Medications  Medication Sig Dispense Refill   Accu-Chek Softclix Lancets lancets USE 1  TO CHECK GLUCOSE TWICE DAILY 200 each 0   ARTIFICIAL TEAR SOLUTION OP Place 1 drop into both eyes daily as needed (dry eyes).     B-D UF III MINI PEN NEEDLES 31G X 5 MM MISC USE ONCE DAILY AS DIRECTED 100 each 2   Blood Glucose Monitoring Suppl (ACCU-CHEK GUIDE) w/Device KIT 1 each by Does not apply route 2 (two) times daily. Use as directed to check blood glucose two times daily. 1 kit 0   Cholecalciferol (DIALYVITE VITAMIN D 5000) 125 MCG (5000 UT) capsule Take 5,000 Units by mouth daily.     diphenhydrAMINE (BENADRYL) 50 MG capsule Dispense #1. Take 1 capsule by mouth 1 hour before exam. 1 capsule 0   glipiZIDE (GLUCOTROL XL) 2.5 MG 24 hr tablet Take 2.5 mg by mouth every morning.     glucose  blood test strip 1 each by Other route 2 (two) times daily. Use as instructed to test blood glucose two times daily ACCU-CHEK GUIDE TEST STRIPS 200 each 0   insulin degludec (TRESIBA FLEXTOUCH) 100 UNIT/ML FlexTouch Pen Inject 30 Units into the skin at bedtime. 36 mL 3   losartan (COZAAR) 25 MG tablet Take 25 mg by mouth at bedtime.      meloxicam (MOBIC) 7.5 MG tablet Take 7.5 mg by mouth daily as needed.     predniSONE (DELTASONE) 50 MG tablet Dispense #3. Take 1 tablet by mouth 13 hours prior to exam, take 1 tab by mouth 7 hours prior to exam, then take 1 tablet by mouth 1 hour prior to exam. 3 tablet 0   rosuvastatin (CRESTOR) 10 MG tablet Take 10 mg by mouth at bedtime.      trolamine salicylate (ASPERCREME) 10 % cream Apply 1 application topically as needed (knee pain).     Dulaglutide (TRULICITY) 1.5 MG/0.5ML SOPN Inject 1.5 mg into the skin every Monday. (Patient not taking: Reported on 07/27/2023) 6 mL 3   ibuprofen (ADVIL) 200 MG tablet Take 400 mg by mouth at bedtime. (Patient not taking: Reported on 07/27/2023)     loratadine (CLARITIN) 10 MG tablet Take 10 mg by mouth daily. (Patient not taking: Reported on 07/27/2023)     No current facility-administered medications for this visit.    Allergies: Allergies  Allergen Reactions   Erythromycin Palpitations   Ivp Dye [Iodinated  Contrast Media] Rash    Past Medical History:  Diagnosis Date   Arthritis    Diabetes mellitus without complication (HCC)    Hyperlipidemia    Hypertension    patient denies. States she is on losartan for kidney protection.    Obesity    Palpitations    PONV (postoperative nausea and vomiting)    Reflux esophagitis    Past Surgical History:  Procedure Laterality Date   ABDOMINAL HYSTERECTOMY     APPENDECTOMY     BIOPSY  11/19/2020   Procedure: BIOPSY;  Surgeon: Corbin Ade, MD;  [facility];  Service: Endoscopy;;   CATARACT EXTRACTION Left    CATARACT EXTRACTION W/PHACO Right  11/18/2013   Procedure: CATARACT EXTRACTION PHACO AND INTRAOCULAR LENS PLACEMENT (IOC);  Surgeon: Gemma Payor, MD;  Location: [facility];  Service: Ophthalmology;  Laterality: Right;  CDE:  7.76   CESAREAN SECTION     x2   CHEST WALL BIOPSY Right    removal of fatty tumor   COLONOSCOPY  11/08/2004   [id number] rectosigmoid polyp/left side diverticula   COLONOSCOPY  12/2009   RMR: left-sided diverticula, long tortuous colon   COLONOSCOPY  2003   RMR: carcinoma in situ   COLONOSCOPY N/A 04/07/2015    one 6 mm polyp and one 4 mm polyp, scattered left-sided diverticula, redundant colon.  Pathology with benign colonic mucosa.  Recommended repeat colonoscopy in 5 years   COLONOSCOPY WITH PROPOFOL N/A 11/19/2020   Procedure: COLONOSCOPY WITH PROPOFOL;  Surgeon: Corbin Ade, MD;  [facility]: [facility];  Service: Endoscopy;  Laterality: N/A;  10:00am   POLYPECTOMY  11/19/2020   Procedure: POLYPECTOMY;  Surgeon: Corbin Ade, MD;  Location: [facility];  Service: Endoscopy;;   Family History  Problem Relation Age of Onset   Hypertension Mother    Colon cancer Cousin        paternal    Colon cancer Cousin        paternal   Colon cancer Cousin        paternal   Colon polyps Father    Prostate cancer Father    Breast cancer Maternal Aunt    Colon polyps Brother        multiple   Colon polyps Brother    Social History   Socioeconomic History   Marital status: Married    Spouse name: Not on file   Number of children: 2   Years of education: Not on file   Highest education level: Not on file  Occupational History   Not on file  Tobacco Use   Smoking status: Never   Smokeless tobacco: Never  Vaping Use   Vaping status: Never Used  Substance and Sexual Activity   Alcohol use: No   Drug use: No   Sexual activity: Yes    Birth control/protection: Surgical  Other Topics Concern   Not on file  Social History Narrative   Not on file   Social Drivers of Health   Financial  Resource Strain: Not on file  Food Insecurity: Not on file  Transportation Needs: Not on file  Physical Activity: Not on file  Stress: Not on file  Social Connections: Not on file  Intimate Partner Violence: Not on file    SUBJECTIVE  Review of Systems Constitutional: Patient denies any unintentional weight loss or change in strength lntegumentary: Patient denies any rashes or pruritus Cardiovascular: Patient denies chest pain or syncope Respiratory: Patient denies shortness of  breath Gastrointestinal: Patient denies nausea, vomiting, constipation, or diarrhea Musculoskeletal: Patient denies muscle cramps or weakness Neurologic: Patient denies convulsions or seizures Allergic/Immunologic: Patient denies recent allergic reaction(s) Hematologic/Lymphatic: Patient denies bleeding tendencies Endocrine: Patient denies heat/cold intolerance  GU: As per HPI.  OBJECTIVE Vitals:   07/27/23 0914  BP: (!) 130/57  Pulse: 78  Temp: 98.1 F (36.7 C)   There is no height or weight on file to calculate BMI.  Physical Examination Constitutional: No obvious distress; patient is non-toxic appearing  Cardiovascular: No visible lower extremity edema.  Respiratory: The patient does not have audible wheezing/stridor; respirations do not appear labored  Gastrointestinal: Abdomen non-distended Musculoskeletal: Normal ROM of UEs  Skin: No obvious rashes/open sores  Neurologic: CN 2-12 grossly intact Psychiatric: Answered questions appropriately with normal affect  Hematologic/Lymphatic/Immunologic: No obvious bruises or sites of spontaneous bleeding  Urine microscopy: >30 WBC/hpf, 3-10 RBC/hpf, few bacteria PVR: 0 ml  ASSESSMENT Gross hematuria - Plan: Urinalysis, Routine w reflex microscopic, BLADDER SCAN AMB NON-IMAGING, Cytology, urine, CT HEMATURIA WORKUP, diphenhydrAMINE (BENADRYL) 50 MG capsule, predniSONE (DELTASONE) 50 MG tablet  Contrast media allergy - Plan: diphenhydrAMINE  (BENADRYL) 50 MG capsule, predniSONE (DELTASONE) 50 MG tablet  For management of gross hematuria, we discussed possible etiologies including but not limited to: vigorous exercise, sexual activity, stone, trauma, blood thinner use, urinary tract infection, kidney function, malignancy.   We discussed the importance of work-up including assessing the upper and lower GU tract with CT urogram and cystoscopy. We will also check voided cytology.  We discussed the risk for clot retention and pt was advised to increase fluid intake to thin out clots. Pt was advised to go to the ER if they become unable to urinate due to clot retention, start having symptoms of anemia (which were discussed), or any other significant concerning acute symptoms.  Pt verbalized understanding and decided to pursue this work-up. Patient agreed to follow-up afterward to discuss the results and formulate a treatment plan based on the findings. All questions were answered.   PLAN Advised the following: CT ordered. Pre-treatment meds ordered for management of IV contrast allergy. Voided cytology ordered. 3. Return for 1st available cystoscopy with any urology MD.  Orders Placed This Encounter  Procedures   CT HEMATURIA WORKUP    Standing Status:   Future    Expiration Date:   07/26/2024    Reason for Exam (SYMPTOM  OR DIAGNOSIS REQUIRED):   Gross hematuria    Preferred imaging location?:   [facility]   Urinalysis, Routine w reflex microscopic   BLADDER SCAN AMB NON-IMAGING    It has been explained that the patient is to follow regularly with their PCP in addition to all other providers involved in their care and to follow instructions provided by these respective offices. Patient advised to contact urology clinic if any urologic-pertaining questions, concerns, new symptoms or problems arise in the interim period.  There are no Patient Instructions on file for this visit.  Electronically signed by:  Donnita Falls, MSN, FNP-C, CUNP 07/27/2023 9:55 AM

## 2023-07-27 ENCOUNTER — Ambulatory Visit: Payer: Medicare PPO | Admitting: Urology

## 2023-07-27 ENCOUNTER — Encounter: Payer: Self-pay | Admitting: Urology

## 2023-07-27 ENCOUNTER — Telehealth: Payer: Self-pay

## 2023-07-27 VITALS — BP 130/57 | HR 78 | Temp 98.1°F

## 2023-07-27 DIAGNOSIS — Z91041 Radiographic dye allergy status: Secondary | ICD-10-CM

## 2023-07-27 DIAGNOSIS — R310 Gross hematuria: Secondary | ICD-10-CM | POA: Diagnosis not present

## 2023-07-27 LAB — URINALYSIS, ROUTINE W REFLEX MICROSCOPIC
Bilirubin, UA: NEGATIVE
Glucose, UA: NEGATIVE
Ketones, UA: NEGATIVE
Nitrite, UA: NEGATIVE
Protein,UA: NEGATIVE
RBC, UA: NEGATIVE
Specific Gravity, UA: 1.025 (ref 1.005–1.030)
Urobilinogen, Ur: 1 mg/dL (ref 0.2–1.0)
pH, UA: 6 (ref 5.0–7.5)

## 2023-07-27 LAB — MICROSCOPIC EXAMINATION: WBC, UA: >30 /[HPF] — AB (ref 0–5)

## 2023-07-27 LAB — BLADDER SCAN AMB NON-IMAGING: Scan Result: 0

## 2023-07-27 MED ORDER — DIPHENHYDRAMINE HCL 50 MG PO CAPS: ORAL_CAPSULE | ORAL | 0 refills | Status: DC

## 2023-07-27 MED ORDER — PREDNISONE 50 MG PO TABS
ORAL_TABLET | ORAL | 0 refills | Status: DC
Start: 2023-07-27 — End: 2023-11-27

## 2023-07-27 NOTE — Telephone Encounter (Signed)
 Patient will return around 3:30 or 4 pm for urine specimen for cytology. Patient verbalized understanding.

## 2023-07-27 NOTE — Addendum Note (Signed)
 Addended byEvette Georges on: 07/27/2023 09:55 AM   Modules accepted: Orders

## 2023-07-29 LAB — CYTOLOGY, URINE

## 2023-07-31 ENCOUNTER — Other Ambulatory Visit: Payer: Self-pay | Admitting: Urology

## 2023-07-31 ENCOUNTER — Telehealth: Payer: Self-pay

## 2023-07-31 NOTE — Telephone Encounter (Signed)
Patient is made aware and verbalized understanding.

## 2023-07-31 NOTE — Telephone Encounter (Signed)
-----   Message from Donnita Falls sent at 07/31/2023  8:42 AM EST ----- Please notify patient: Negative voided cytology - no malignant findings. Follow up for cystoscopy and CT urogram as planned for further evaluation. Thanks.

## 2023-08-02 DIAGNOSIS — M1712 Unilateral primary osteoarthritis, left knee: Secondary | ICD-10-CM | POA: Diagnosis not present

## 2023-08-02 NOTE — Telephone Encounter (Signed)
 Tried calling with no answer and unable to leave voiced message due to mailbox is full. "In that case she may hold off on imaging until seen by Dr. Mena Goes 08/28/2023 so he can discuss it with her further and determine appropriate next steps."

## 2023-08-02 NOTE — Telephone Encounter (Signed)
 FYI and advise if needed

## 2023-08-02 NOTE — Telephone Encounter (Signed)
 Patient state's that she had benadryl and prednisone before and it did not help her with adverse allergic reaction. Patient want's to know if there is any additional test that can be done due to her allergic reaction and do not want to go through the allergic  adverse reaction to the contrast dye. "Needs contrast for hematuria evaluation. I will send pre-treatment medications to her pharmacy to prevent allergic reaction to the contrast dye. " Patient is aware a message will be sent to Sarah on advisement. Patient verbalized understanding.

## 2023-08-08 DIAGNOSIS — M1712 Unilateral primary osteoarthritis, left knee: Secondary | ICD-10-CM | POA: Diagnosis not present

## 2023-08-08 NOTE — Telephone Encounter (Signed)
 Tried calling with no answer and unable to leave voiced message due to mailbox is full

## 2023-08-10 DIAGNOSIS — M1712 Unilateral primary osteoarthritis, left knee: Secondary | ICD-10-CM | POA: Diagnosis not present

## 2023-08-11 ENCOUNTER — Ambulatory Visit (HOSPITAL_COMMUNITY): Payer: Medicare PPO

## 2023-08-15 DIAGNOSIS — M1712 Unilateral primary osteoarthritis, left knee: Secondary | ICD-10-CM | POA: Diagnosis not present

## 2023-08-17 DIAGNOSIS — M1712 Unilateral primary osteoarthritis, left knee: Secondary | ICD-10-CM | POA: Diagnosis not present

## 2023-08-23 DIAGNOSIS — M1712 Unilateral primary osteoarthritis, left knee: Secondary | ICD-10-CM | POA: Diagnosis not present

## 2023-08-25 DIAGNOSIS — M1712 Unilateral primary osteoarthritis, left knee: Secondary | ICD-10-CM | POA: Diagnosis not present

## 2023-08-28 ENCOUNTER — Other Ambulatory Visit: Payer: Medicare PPO | Admitting: Urology

## 2023-08-29 DIAGNOSIS — M1712 Unilateral primary osteoarthritis, left knee: Secondary | ICD-10-CM | POA: Diagnosis not present

## 2023-09-06 DIAGNOSIS — M1712 Unilateral primary osteoarthritis, left knee: Secondary | ICD-10-CM | POA: Diagnosis not present

## 2023-09-11 DIAGNOSIS — M1712 Unilateral primary osteoarthritis, left knee: Secondary | ICD-10-CM | POA: Diagnosis not present

## 2023-09-14 DIAGNOSIS — M1712 Unilateral primary osteoarthritis, left knee: Secondary | ICD-10-CM | POA: Diagnosis not present

## 2023-09-26 DIAGNOSIS — M1712 Unilateral primary osteoarthritis, left knee: Secondary | ICD-10-CM | POA: Diagnosis not present

## 2023-09-29 DIAGNOSIS — M1712 Unilateral primary osteoarthritis, left knee: Secondary | ICD-10-CM | POA: Diagnosis not present

## 2023-10-02 DIAGNOSIS — M1712 Unilateral primary osteoarthritis, left knee: Secondary | ICD-10-CM | POA: Diagnosis not present

## 2023-10-04 DIAGNOSIS — I10 Essential (primary) hypertension: Secondary | ICD-10-CM | POA: Diagnosis not present

## 2023-10-04 DIAGNOSIS — Z1329 Encounter for screening for other suspected endocrine disorder: Secondary | ICD-10-CM | POA: Diagnosis not present

## 2023-10-04 DIAGNOSIS — E782 Mixed hyperlipidemia: Secondary | ICD-10-CM | POA: Diagnosis not present

## 2023-10-04 DIAGNOSIS — E7849 Other hyperlipidemia: Secondary | ICD-10-CM | POA: Diagnosis not present

## 2023-10-04 DIAGNOSIS — E559 Vitamin D deficiency, unspecified: Secondary | ICD-10-CM | POA: Diagnosis not present

## 2023-10-04 DIAGNOSIS — E119 Type 2 diabetes mellitus without complications: Secondary | ICD-10-CM | POA: Diagnosis not present

## 2023-10-10 DIAGNOSIS — E559 Vitamin D deficiency, unspecified: Secondary | ICD-10-CM | POA: Diagnosis not present

## 2023-10-10 DIAGNOSIS — Z6833 Body mass index (BMI) 33.0-33.9, adult: Secondary | ICD-10-CM | POA: Diagnosis not present

## 2023-10-10 DIAGNOSIS — E7849 Other hyperlipidemia: Secondary | ICD-10-CM | POA: Diagnosis not present

## 2023-10-10 DIAGNOSIS — E782 Mixed hyperlipidemia: Secondary | ICD-10-CM | POA: Diagnosis not present

## 2023-10-10 DIAGNOSIS — E1169 Type 2 diabetes mellitus with other specified complication: Secondary | ICD-10-CM | POA: Diagnosis not present

## 2023-10-10 DIAGNOSIS — M1712 Unilateral primary osteoarthritis, left knee: Secondary | ICD-10-CM | POA: Diagnosis not present

## 2023-10-10 DIAGNOSIS — R319 Hematuria, unspecified: Secondary | ICD-10-CM | POA: Diagnosis not present

## 2023-10-10 DIAGNOSIS — I10 Essential (primary) hypertension: Secondary | ICD-10-CM | POA: Diagnosis not present

## 2023-10-10 DIAGNOSIS — Z1231 Encounter for screening mammogram for malignant neoplasm of breast: Secondary | ICD-10-CM | POA: Diagnosis not present

## 2023-10-11 ENCOUNTER — Encounter: Payer: Self-pay | Admitting: *Deleted

## 2023-10-12 DIAGNOSIS — M1712 Unilateral primary osteoarthritis, left knee: Secondary | ICD-10-CM | POA: Diagnosis not present

## 2023-10-17 DIAGNOSIS — M1712 Unilateral primary osteoarthritis, left knee: Secondary | ICD-10-CM | POA: Diagnosis not present

## 2023-10-25 DIAGNOSIS — M1712 Unilateral primary osteoarthritis, left knee: Secondary | ICD-10-CM | POA: Diagnosis not present

## 2023-10-27 DIAGNOSIS — M1712 Unilateral primary osteoarthritis, left knee: Secondary | ICD-10-CM | POA: Diagnosis not present

## 2023-10-30 ENCOUNTER — Ambulatory Visit: Admitting: Urology

## 2023-10-30 ENCOUNTER — Encounter: Payer: Self-pay | Admitting: Urology

## 2023-10-30 VITALS — BP 133/67 | HR 83

## 2023-10-30 DIAGNOSIS — R310 Gross hematuria: Secondary | ICD-10-CM

## 2023-10-30 LAB — URINALYSIS, ROUTINE W REFLEX MICROSCOPIC
Bilirubin, UA: NEGATIVE
Glucose, UA: NEGATIVE
Ketones, UA: NEGATIVE
Nitrite, UA: NEGATIVE
Protein,UA: NEGATIVE
Specific Gravity, UA: 1.015 (ref 1.005–1.030)
Urobilinogen, Ur: 2 mg/dL — ABNORMAL HIGH (ref 0.2–1.0)
pH, UA: 6 (ref 5.0–7.5)

## 2023-10-30 LAB — MICROSCOPIC EXAMINATION: Bacteria, UA: NONE SEEN

## 2023-10-30 MED ORDER — CIPROFLOXACIN HCL 500 MG PO TABS
500.0000 mg | ORAL_TABLET | Freq: Once | ORAL | Status: AC
  Administered 2023-10-30: 500 mg via ORAL

## 2023-10-30 NOTE — Progress Notes (Signed)
  [name]   10/30/23  CC: No chief complaint on file.   HPI:  F/u for cystoscopy. Saw Amanda Hopkins gross hematuria - ua > 30 rbc, few bac. No cx. CT pending/ordered FEB 2025 but pt allergic to contrast. Did not have a non-con done. No further gross hematuria or dysuria.   Blood pressure 133/67, pulse 83. NED. A&Ox3.   No respiratory distress   Abd soft, NT, ND Normal external genitalia with patent urethral meatus No prolapse   Chaperone for exam and cysto - Alisheya   Cystoscopy Procedure Note  Patient identification was confirmed, informed consent was obtained, and patient was prepped using Betadine  solution.  Lidocaine  jelly was administered per urethral meatus.    Procedure: - Flexible cystoscope introduced, without any difficulty.   - Thorough search of the bladder revealed:    normal urethral meatus    normal urothelium    no stones    no ulcers     no tumors    no urethral polyps    no trabeculation  - Ureteral orifices were normal in position and appearance.  Post-Procedure: - Patient tolerated the procedure well  Assessment/ Plan:  Hematuria - check noncon CT. F/u with Amanda Hopkins to review. If benign yearly f/u appropriate.     No follow-ups on file.  Christina Coyer, MD

## 2023-11-06 ENCOUNTER — Ambulatory Visit (HOSPITAL_COMMUNITY)
Admission: RE | Admit: 2023-11-06 | Discharge: 2023-11-06 | Disposition: A | Discharge status: Home / Self Care | Source: Ambulatory Visit | Attending: Urology | Admitting: Urology

## 2023-11-06 DIAGNOSIS — Z90710 Acquired absence of both cervix and uterus: Secondary | ICD-10-CM | POA: Diagnosis not present

## 2023-11-06 DIAGNOSIS — R319 Hematuria, unspecified: Secondary | ICD-10-CM | POA: Diagnosis not present

## 2023-11-06 DIAGNOSIS — K5730 Diverticulosis of large intestine without perforation or abscess without bleeding: Secondary | ICD-10-CM | POA: Diagnosis not present

## 2023-11-06 DIAGNOSIS — R310 Gross hematuria: Secondary | ICD-10-CM | POA: Diagnosis not present

## 2023-11-06 DIAGNOSIS — K439 Ventral hernia without obstruction or gangrene: Secondary | ICD-10-CM | POA: Diagnosis not present

## 2023-11-07 ENCOUNTER — Ambulatory Visit: Payer: Self-pay

## 2023-11-25 NOTE — Progress Notes (Unsigned)
 Referring Provider:Williams, Vaughn FALCON, MD Primary Care Physician:  Trudy Vaughn FALCON, MD Primary Gastroenterologist:  Dr. Shaaron  Chief Complaint  Patient presents with   Follow-up    Follow up before scheduling colonoscopy    HPI:   Amanda Hopkins is a 78 y.o. 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  She is presenting today to discuss scheduling surveillance colonoscopy.    History of poor prep with MiraLAX previously.  Was given 5 days of Lizness 145 mcg prior to [place] colon prep last time.   Last colonoscopy 11/18/2020: - Diverticulosis in the sigmoid colon.  - Three 4 to 6 mm polyps in the rectum, in the descending colon and in the cecum, removed with a cold snare. Resected and retrieved.  - One 4 mm polyp in the sigmoid colon, removed with a cold biopsy forceps. Resected and retrieved.  - One 8 mm polyp in the descending colon, removed with a hot snare. recovery pending.  - The examination was otherwise normal on direct and retroflexion views. - pathology with tubular adenomas and hyperplastic polyps. - Recommended 3-year surveillance if overall health permits.   Today:  Reports she is doing well overall.  No GI concerns.  States  she has started making smoothies with almond milk, pain yogurt, and frozen fruit 3 times a week and doesn't get constipated.  No BRBPR, melena, unintentional weight loss, abdominal pain.  Also denies GERD, nausea, vomiting, dysphagia.    Past Medical History:  Diagnosis Date   Arthritis    Diabetes mellitus without complication (HCC)    Hyperlipidemia    Hypertension    patient denies. States she is on losartan for kidney protection.    Nephrolithiasis    Obesity    Palpitations    PONV (postoperative nausea and vomiting)    Reflux esophagitis     Past Surgical History:  Procedure  Laterality Date   ABDOMINAL HYSTERECTOMY     APPENDECTOMY     BIOPSY  11/19/2020   Procedure: BIOPSY;  Surgeon: Shaaron Lamar HERO, MD;  [facility];  Service: Endoscopy;;   CATARACT EXTRACTION Left    CATARACT EXTRACTION W/PHACO Right 11/18/2013   Procedure: CATARACT EXTRACTION PHACO AND INTRAOCULAR LENS PLACEMENT (IOC);  Surgeon: Cherene Mania, MD;  Location: [facility];  Service: Ophthalmology;  Laterality: Right;  CDE:  7.76   CESAREAN SECTION     x2   CHEST WALL BIOPSY Right    removal of fatty tumor   COLONOSCOPY  11/08/2004   [id number] rectosigmoid polyp/left side diverticula   COLONOSCOPY  12/2009   RMR: left-sided diverticula, long tortuous colon   COLONOSCOPY  2003   RMR: carcinoma in situ   COLONOSCOPY N/A 04/07/2015    one 6 mm polyp and one 4 mm polyp, scattered left-sided diverticula, redundant colon.  Pathology with benign colonic mucosa.  Recommended repeat colonoscopy in 5 years   COLONOSCOPY WITH PROPOFOL  N/A 11/19/2020   Procedure: COLONOSCOPY WITH PROPOFOL ;  Surgeon: Shaaron Lamar HERO, MD;  [facility];  Service: Endoscopy;  Laterality: N/A;  10:00am   POLYPECTOMY  11/19/2020   Procedure: POLYPECTOMY;  Surgeon: Shaaron Lamar HERO, MD;  [facility]: [facility];  Service: Endoscopy;;    Current Outpatient Medications  Medication Sig Dispense Refill   Accu-Chek Softclix Lancets lancets USE 1  TO CHECK GLUCOSE TWICE DAILY  200 each 0   ARTIFICIAL TEAR SOLUTION OP Place 1 drop into both eyes daily as needed (dry eyes).     B-D UF III MINI PEN NEEDLES 31G X 5 MM MISC USE ONCE DAILY AS DIRECTED 100 each 2   Blood Glucose Monitoring Suppl (ACCU-CHEK GUIDE) w/Device KIT 1 each by Does not apply route 2 (two) times daily. Use as directed to check blood glucose two times daily. 1 kit 0   cyanocobalamin (VITAMIN B12) 1000 MCG tablet Take 1,000 mcg by mouth daily.     glipiZIDE  (GLUCOTROL  XL) 2.5 MG 24 hr tablet Take 2.5 mg by mouth every morning.     glucose blood test  strip 1 each by Other route 2 (two) times daily. Use as instructed to test blood glucose two times daily ACCU-CHEK GUIDE TEST STRIPS 200 each 0   insulin  degludec (TRESIBA  FLEXTOUCH) 100 UNIT/ML FlexTouch Pen Inject 30 Units into the skin at bedtime. 36 mL 3   losartan (COZAAR) 25 MG tablet Take 25 mg by mouth at bedtime.      rosuvastatin (CRESTOR) 10 MG tablet Take 10 mg by mouth at bedtime.      No current facility-administered medications for this visit.    Allergies as of 11/27/2023 - Review Complete 11/27/2023  Allergen Reaction Noted   Erythromycin Palpitations 04/29/2013   Ivp dye [iodinated contrast media] Rash 12/31/2019    Family History  Problem Relation Age of Onset   Hypertension Mother    Colon cancer Cousin        paternal    Colon cancer Cousin        paternal   Colon cancer Cousin        paternal   Colon polyps Father    Prostate cancer Father    Breast cancer Maternal Aunt    Colon polyps Brother        multiple   Colon polyps Brother     Social History   Socioeconomic History   Marital status: Married    Spouse name: Not on file   Number of children: 2   Years of education: Not on file   Highest education level: Not on file  Occupational History   Not on file  Tobacco Use   Smoking status: Never   Smokeless tobacco: Never  Vaping Use   Vaping status: Never Used  Substance and Sexual Activity   Alcohol  use: No   Drug use: No   Sexual activity: Yes    Birth control/protection: Surgical  Other Topics Concern   Not on file  Social History Narrative   Not on file   Social Drivers of Health   Financial Resource Strain: Not on file  Food Insecurity: Not on file  Transportation Needs: Not on file  Physical Activity: Not on file  Stress: Not on file  Social Connections: Not on file  Intimate Partner Violence: Not on file    Review of Systems: Gen: Denies any fever, chills, cold or flulike symptoms, presyncope, syncope. CV: Denies chest  pain, heart palpitations. Resp: Denies shortness of breath, cough. GI: See HPI GU : Denies urinary burning, urinary frequency, urinary hesitancy MS: Denies joint pain. Derm: Denies rash. Psych: Denies depression, anxiety. Heme: See HPI  Physical Exam: BP 107/67   Pulse 86   Temp 97.8 F (36.6 C)   Ht 5' 7 (1.702 m)   Wt 218 lb (98.9 kg)   BMI 34.14 kg/m  General:   Alert and oriented. Pleasant and  cooperative. Well-nourished and well-developed.  Head:  Normocephalic and atraumatic. Eyes:  Without icterus, sclera clear and conjunctiva pink.  Ears:  Normal auditory acuity. Lungs:  Clear to auscultation bilaterally. No wheezes, rales, or rhonchi. No distress.  Heart:  S1, S2 present without murmurs appreciated.  Abdomen:  +BS, soft, non-tender and non-distended. No HSM noted. No guarding or rebound. No masses appreciated.  Rectal:  Deferred  Msk:  Symmetrical without gross deformities. Normal posture. Extremities:  Without edema. Neurologic:  Alert and  oriented x4;  grossly normal neurologically. Skin:  Intact without significant lesions or rashes. Psych:  Normal mood and affect.    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  Clinically she is doing well without any significant GI symptoms.  Last colonoscopy 11/18/2020 with multiple tubular adenomas and hyperplastic polyps removed.  Recommended 3-year surveillance if overall health permits.   Plan:  Proceed with colonoscopy with propofol  by Dr. Shaaron in near future. The risks, benefits, and alternatives have been discussed with the patient in detail. The patient states understanding and desires to proceed.  ASA 2 Will give TriLyte prep with Linzess 145 mcg daily x 5 days prior to  starting prep as she did well with this last time and has history of poor prep in the past. 1 day prior: One half dose of Tresiba  Day of: No morning diabetes medications Follow-up as needed.    Josette Centers, PA-C [facility] 11/27/2023

## 2023-11-27 ENCOUNTER — Ambulatory Visit: Admitting: Gastroenterology

## 2023-11-27 ENCOUNTER — Telehealth: Payer: Self-pay | Admitting: *Deleted

## 2023-11-27 ENCOUNTER — Encounter: Payer: Self-pay | Admitting: Gastroenterology

## 2023-11-27 VITALS — BP 107/67 | HR 86 | Temp 97.8°F | Ht 67.0 in | Wt 218.0 lb

## 2023-11-27 DIAGNOSIS — D010 Carcinoma in situ of colon: Secondary | ICD-10-CM

## 2023-11-27 DIAGNOSIS — Z860100 Personal history of colon polyps, unspecified: Secondary | ICD-10-CM

## 2023-11-27 DIAGNOSIS — Z09 Encounter for follow-up examination after completed treatment for conditions other than malignant neoplasm: Secondary | ICD-10-CM

## 2023-11-27 DIAGNOSIS — Z800 Family history of malignant neoplasm of digestive organs: Secondary | ICD-10-CM | POA: Diagnosis not present

## 2023-11-27 DIAGNOSIS — Z860101 Personal history of adenomatous and serrated colon polyps: Secondary | ICD-10-CM

## 2023-11-27 DIAGNOSIS — Z83719 Family history of colon polyps, unspecified: Secondary | ICD-10-CM | POA: Diagnosis not present

## 2023-11-27 DIAGNOSIS — Z860102 Personal history of hyperplastic colon polyps: Secondary | ICD-10-CM | POA: Diagnosis not present

## 2023-11-27 NOTE — Telephone Encounter (Signed)
 Called pt, no answer and not able to leave VM, VM full Needs TCS with Dr. Shaaron, asa 2, see encounter form for details

## 2023-11-27 NOTE — Patient Instructions (Addendum)
 We will get you scheduled for colonoscopy in the near future with Dr. Shaaron.  We will see you back as needed.   Josette Centers, PA-C [facility]

## 2023-12-11 DIAGNOSIS — Z87898 Personal history of other specified conditions: Secondary | ICD-10-CM | POA: Insufficient documentation

## 2023-12-11 DIAGNOSIS — N200 Calculus of kidney: Secondary | ICD-10-CM | POA: Insufficient documentation

## 2023-12-11 NOTE — Progress Notes (Unsigned)
 Name: Amanda Hopkins DOB: [birth year] MRN: [id number]  History of Present Illness: Amanda Hopkins is a 78 y.o. female who presents today for follow up visit at [facility] [place]. Relevant History includes: 1. Kidney stones (distant history).  Recent history: > 07/27/2023: First visit to Urology for persistent intermittent gross hematuria for many years. Voided cytology negative for evidence of malignancy.   > 10/30/2023: Cystoscopy by Dr. Nieves showed no evidence of malignancy.  > 11/06/2023: CT abdomen/pelvis w/o contrast showed multiple nonobstructing right intrarenal stones measuring up to 6 mm. No GU masses or hydronephrosis.  Today: She {Actions; denies-reports:120008} recent suspected stone migration / passage. She {Actions; denies-reports:120008} flank pain or abdominal pain. She {Actions; denies-reports:120008} fevers, nausea, or vomiting.  She {Actions; denies-reports:120008} increased urinary urgency, frequency, nocturia, dysuria, gross hematuria, hesitancy, straining to void, or sensations of incomplete emptying.  ***could consider proactive stone procedure  Medications: Current Outpatient Medications  Medication Sig Dispense Refill   Accu-Chek Softclix Lancets lancets USE 1  TO CHECK GLUCOSE TWICE DAILY 200 each 0   ARTIFICIAL TEAR SOLUTION OP Place 1 drop into both eyes daily as needed (dry eyes).     B-D UF III MINI PEN NEEDLES 31G X 5 MM MISC USE ONCE DAILY AS DIRECTED 100 each 2   Blood Glucose Monitoring Suppl (ACCU-CHEK GUIDE) w/Device KIT 1 each by Does not apply route 2 (two) times daily. Use as directed to check blood glucose two times daily. 1 kit 0   cyanocobalamin (VITAMIN B12) 1000 MCG tablet Take 1,000 mcg by mouth daily.     glipiZIDE  (GLUCOTROL  XL) 2.5 MG 24 hr tablet Take 2.5 mg by mouth every morning.     glucose blood test strip 1 each by Other route 2 (two) times daily. Use as instructed to test blood glucose two times daily ACCU-CHEK  GUIDE TEST STRIPS 200 each 0   insulin  degludec (TRESIBA  FLEXTOUCH) 100 UNIT/ML FlexTouch Pen Inject 30 Units into the skin at bedtime. 36 mL 3   losartan (COZAAR) 25 MG tablet Take 25 mg by mouth at bedtime.      rosuvastatin (CRESTOR) 10 MG tablet Take 10 mg by mouth at bedtime.      No current facility-administered medications for this visit.    Allergies: Allergies  Allergen Reactions   Erythromycin Palpitations   Ivp Dye [Iodinated Contrast Media] Rash    Past Medical History:  Diagnosis Date   Arthritis    Diabetes mellitus without complication (HCC)    Hyperlipidemia    Hypertension    patient denies. States she is on losartan for kidney protection.    Nephrolithiasis    Obesity    Palpitations    PONV (postoperative nausea and vomiting)    Reflux esophagitis    Past Surgical History:  Procedure Laterality Date   ABDOMINAL HYSTERECTOMY     APPENDECTOMY     BIOPSY  11/19/2020   Procedure: BIOPSY;  Surgeon: Shaaron Lamar HERO, MD;  [facility];  Service: Endoscopy;;   CATARACT EXTRACTION Left    CATARACT EXTRACTION W/PHACO Right 11/18/2013   Procedure: CATARACT EXTRACTION PHACO AND INTRAOCULAR LENS PLACEMENT (IOC);  Surgeon: Cherene Mania, MD;  Location: [facility];  Service: Ophthalmology;  Laterality: Right;  CDE:  7.76   CESAREAN SECTION     x2   CHEST WALL BIOPSY Right    removal of fatty tumor   COLONOSCOPY  11/08/2004   MFM:ipfpwlupcz rectosigmoid polyp/left side diverticula   COLONOSCOPY  12/2009  RMR: left-sided diverticula, long tortuous colon   COLONOSCOPY  2003   RMR: carcinoma in situ   COLONOSCOPY N/A 04/07/2015    one 6 mm polyp and one 4 mm polyp, scattered left-sided diverticula, redundant colon.  Pathology with benign colonic mucosa.  Recommended repeat colonoscopy in 5 years   COLONOSCOPY WITH PROPOFOL  N/A 11/19/2020   Procedure: COLONOSCOPY WITH PROPOFOL ;  Surgeon: Shaaron Lamar HERO, MD;  [facility]: [facility];  Service: Endoscopy;  Laterality:  N/A;  10:00am   POLYPECTOMY  11/19/2020   Procedure: POLYPECTOMY;  Surgeon: Shaaron Lamar HERO, MD;  Location: [facility];  Service: Endoscopy;;   Family History  Problem Relation Age of Onset   Hypertension Mother    Colon cancer Cousin        paternal    Colon cancer Cousin        paternal   Colon cancer Cousin        paternal   Colon polyps Father    Prostate cancer Father    Breast cancer Maternal Aunt    Colon polyps Brother        multiple   Colon polyps Brother    Social History   Socioeconomic History   Marital status: Married    Spouse name: Not on file   Number of children: 2   Years of education: Not on file   Highest education level: Not on file  Occupational History   Not on file  Tobacco Use   Smoking status: Never   Smokeless tobacco: Never  Vaping Use   Vaping status: Never Used  Substance and Sexual Activity   Alcohol  use: No   Drug use: No   Sexual activity: Yes    Birth control/protection: Surgical  Other Topics Concern   Not on file  Social History Narrative   Not on file   Social Drivers of Health   Financial Resource Strain: Not on file  Food Insecurity: Not on file  Transportation Needs: Not on file  Physical Activity: Not on file  Stress: Not on file  Social Connections: Not on file  Intimate Partner Violence: Not on file    SUBJECTIVE  Review of Systems Constitutional: Patient denies any unintentional weight loss or change in strength lntegumentary: Patient denies any rashes or pruritus Cardiovascular: Patient denies chest pain or syncope Respiratory: Patient denies shortness of breath Gastrointestinal: ***Patient denies nausea, vomiting, constipation, or diarrhea ***As per HPI Musculoskeletal: Patient denies muscle cramps or weakness Neurologic: Patient denies convulsions or seizures Allergic/Immunologic: Patient denies recent allergic reaction(s) Hematologic/Lymphatic: Patient denies bleeding tendencies Endocrine: Patient  denies heat/cold intolerance  GU: As per HPI.  OBJECTIVE There were no vitals filed for this visit. There is no height or weight on file to calculate BMI.  Physical Examination Constitutional: No obvious distress; patient is non-toxic appearing  Cardiovascular: No visible lower extremity edema.  Respiratory: The patient does not have audible wheezing/stridor; respirations do not appear labored  Gastrointestinal: Abdomen non-distended Musculoskeletal: Normal ROM of UEs  Skin: No obvious rashes/open sores  Neurologic: CN 2-12 grossly intact Psychiatric: Answered questions appropriately with normal affect  Hematologic/Lymphatic/Immunologic: No obvious bruises or sites of spontaneous bleeding  UA: ***negative ***positive for *** leukocytes, *** blood, ***nitrites Urine microscopy: *** WBC/hpf, *** RBC/hpf, *** bacteria ***glucosuria (secondary to ***Jardiance ***Farxiga use) ***otherwise unremarkable  PVR: *** ml  ASSESSMENT No diagnosis found.  ***We reviewed recent imaging results; ***awaiting radiology results, appears to have ***no acute findings per provider interpretation.  ***For stone  prevention: Advised adequate hydration and we discussed option to consider low oxalate diet given that calcium oxalate is the most common type of stone. Handout provided about stone prevention diet.  ***For recurrent stone formers: We discussed option to proceed with 24 hour urinalysis ([facility]) for metabolic stone evaluation, which may help with targeted recommendations for dietary I medication therapies for stone prevention. Patient elected to ***proceed/ ***hold off.  Will plan to follow up in ***6 months / ***1 year with ***KUB ***RUS for stone surveillance or sooner if needed.  Patient verbalized understanding of and agreement with current plan. All questions were answered.  PLAN Advised the following: Maintain adequate fluid intake daily. Drink citrus juice (lemon, lime or orange  juice) routinely. Low oxalate diet. No follow-ups on file.  No orders of the defined types were placed in this encounter.   It has been explained that the patient is to follow regularly with their PCP in addition to all other providers involved in their care and to follow instructions provided by these respective offices. Patient advised to contact urology clinic if any urologic-pertaining questions, concerns, new symptoms or problems arise in the interim period.  There are no Patient Instructions on file for this visit.  Electronically signed by:  Lauraine KYM Oz, MSN, FNP-C, CUNP 12/11/2023 1:49 PM

## 2023-12-12 ENCOUNTER — Ambulatory Visit: Admitting: Urology

## 2023-12-12 ENCOUNTER — Encounter: Payer: Self-pay | Admitting: Urology

## 2023-12-12 VITALS — BP 143/83 | HR 76

## 2023-12-12 DIAGNOSIS — N200 Calculus of kidney: Secondary | ICD-10-CM

## 2023-12-12 DIAGNOSIS — Z87898 Personal history of other specified conditions: Secondary | ICD-10-CM

## 2023-12-12 LAB — URINALYSIS, ROUTINE W REFLEX MICROSCOPIC
Bilirubin, UA: NEGATIVE
Glucose, UA: NEGATIVE
Ketones, UA: NEGATIVE
Leukocytes,UA: NEGATIVE
Nitrite, UA: NEGATIVE
Protein,UA: NEGATIVE
RBC, UA: NEGATIVE
Specific Gravity, UA: 1.01 (ref 1.005–1.030)
Urobilinogen, Ur: 0.2 mg/dL (ref 0.2–1.0)
pH, UA: 6.5 (ref 5.0–7.5)

## 2023-12-12 NOTE — Telephone Encounter (Signed)
 Called pt, went straight to VM and VM still full. Letter mailed

## 2023-12-12 NOTE — Patient Instructions (Signed)

## 2023-12-21 ENCOUNTER — Encounter: Payer: Self-pay | Admitting: *Deleted

## 2023-12-21 ENCOUNTER — Other Ambulatory Visit: Payer: Self-pay | Admitting: *Deleted

## 2023-12-21 MED ORDER — PEG 3350-KCL-NA BICARB-NACL 420 G PO SOLR: 4000.0000 mL | Freq: Once | ORAL | 0 refills | Status: AC

## 2023-12-21 NOTE — Telephone Encounter (Signed)
 Pt has been scheduled for 01/22/24. Instructions mailed and prep sent to pharmacy.   [facility] PA: Doesn't require submission in most cases [phone] Diagnostic exam of large bowel using a flexible endoscope

## 2023-12-21 NOTE — Telephone Encounter (Signed)
 Attempted to call pt, unable to leave vm due to mailbox being full  Pt left vm to schedule procedure.

## 2024-01-22 ENCOUNTER — Ambulatory Visit (HOSPITAL_BASED_OUTPATIENT_CLINIC_OR_DEPARTMENT_OTHER): Admitting: Certified Registered"

## 2024-01-22 ENCOUNTER — Encounter (HOSPITAL_COMMUNITY)
Admission: RE | Disposition: A | Discharge status: Home / Self Care | Payer: Self-pay | Source: Home / Self Care | Attending: Internal Medicine

## 2024-01-22 ENCOUNTER — Encounter (HOSPITAL_COMMUNITY): Payer: Self-pay | Admitting: Internal Medicine

## 2024-01-22 ENCOUNTER — Ambulatory Visit (HOSPITAL_COMMUNITY): Admitting: Certified Registered"

## 2024-01-22 ENCOUNTER — Other Ambulatory Visit: Payer: Self-pay

## 2024-01-22 ENCOUNTER — Ambulatory Visit (HOSPITAL_COMMUNITY)
Admission: RE | Admit: 2024-01-22 | Discharge: 2024-01-22 | Disposition: A | Discharge status: Home / Self Care | Attending: Internal Medicine | Admitting: Internal Medicine

## 2024-01-22 DIAGNOSIS — K5730 Diverticulosis of large intestine without perforation or abscess without bleeding: Secondary | ICD-10-CM | POA: Diagnosis not present

## 2024-01-22 DIAGNOSIS — I10 Essential (primary) hypertension: Secondary | ICD-10-CM | POA: Diagnosis not present

## 2024-01-22 DIAGNOSIS — E119 Type 2 diabetes mellitus without complications: Secondary | ICD-10-CM | POA: Insufficient documentation

## 2024-01-22 DIAGNOSIS — D125 Benign neoplasm of sigmoid colon: Secondary | ICD-10-CM | POA: Insufficient documentation

## 2024-01-22 DIAGNOSIS — Z83719 Family history of colon polyps, unspecified: Secondary | ICD-10-CM | POA: Diagnosis not present

## 2024-01-22 DIAGNOSIS — Z860101 Personal history of adenomatous and serrated colon polyps: Secondary | ICD-10-CM | POA: Diagnosis not present

## 2024-01-22 DIAGNOSIS — Z1211 Encounter for screening for malignant neoplasm of colon: Secondary | ICD-10-CM | POA: Insufficient documentation

## 2024-01-22 DIAGNOSIS — Z860100 Personal history of colon polyps, unspecified: Secondary | ICD-10-CM | POA: Diagnosis not present

## 2024-01-22 DIAGNOSIS — Z800 Family history of malignant neoplasm of digestive organs: Secondary | ICD-10-CM | POA: Diagnosis not present

## 2024-01-22 DIAGNOSIS — K635 Polyp of colon: Secondary | ICD-10-CM | POA: Diagnosis not present

## 2024-01-22 DIAGNOSIS — D123 Benign neoplasm of transverse colon: Secondary | ICD-10-CM | POA: Diagnosis not present

## 2024-01-22 LAB — GLUCOSE, CAPILLARY: Glucose-Capillary: 159 mg/dL — ABNORMAL HIGH (ref 70–99)

## 2024-01-22 SURGERY — COLONOSCOPY
Anesthesia: General

## 2024-01-22 MED ORDER — PHENYLEPHRINE 80 MCG/ML (10ML) SYRINGE FOR IV PUSH (FOR BLOOD PRESSURE SUPPORT)
PREFILLED_SYRINGE | INTRAVENOUS | Status: DC | PRN
  Administered 2024-01-22 (×3): 80 ug via INTRAVENOUS

## 2024-01-22 MED ORDER — SPOT INK MARKER SYRINGE KIT
PACK | SUBMUCOSAL | Status: DC | PRN
  Administered 2024-01-22: .5 mL via SUBMUCOSAL

## 2024-01-22 MED ORDER — LACTATED RINGERS IV SOLN: INTRAVENOUS | Status: DC

## 2024-01-22 MED ORDER — SODIUM CHLORIDE 0.9 % IV SOLN
INTRAVENOUS | Status: DC | PRN
Start: 2024-01-22 — End: 2024-01-22

## 2024-01-22 MED ORDER — PROPOFOL 10 MG/ML IV BOLUS
INTRAVENOUS | Status: DC | PRN
  Administered 2024-01-22: 20 mg via INTRAVENOUS
  Administered 2024-01-22: 80 mg via INTRAVENOUS
  Administered 2024-01-22: 125 ug/kg/min via INTRAVENOUS

## 2024-01-22 MED ORDER — LIDOCAINE 2% (20 MG/ML) 5 ML SYRINGE
INTRAMUSCULAR | Status: DC | PRN
Start: 2024-01-22 — End: 2024-01-22
  Administered 2024-01-22: 60 mg via INTRAVENOUS

## 2024-01-22 NOTE — Anesthesia Preprocedure Evaluation (Signed)
 Anesthesia Evaluation  Patient identified by MRN, date of birth, ID band Patient awake    Reviewed: Allergy & Precautions, H&P , NPO status , Patient's Chart, lab work & pertinent test results, reviewed documented beta blocker date and time   History of Anesthesia Complications (+) PONV and history of anesthetic complications  Airway Mallampati: II  TM Distance: >3 FB Neck ROM: full    Dental no notable dental hx.    Pulmonary neg pulmonary ROS   Pulmonary exam normal breath sounds clear to auscultation       Cardiovascular Exercise Tolerance: Good hypertension,  Rhythm:regular Rate:Normal     Neuro/Psych negative neurological ROS  negative psych ROS   GI/Hepatic negative GI ROS, Neg liver ROS,,,  Endo/Other  diabetes    Renal/GU Renal disease  negative genitourinary   Musculoskeletal   Abdominal   Peds  Hematology negative hematology ROS (+)   Anesthesia Other Findings   Reproductive/Obstetrics negative OB ROS                              Anesthesia Physical Anesthesia Plan  ASA: 2  Anesthesia Plan: General   Post-op Pain Management:    Induction:   PONV Risk Score and Plan: Propofol  infusion  Airway Management Planned:   Additional Equipment:   Intra-op Plan:   Post-operative Plan:   Informed Consent: I have reviewed the patients History and Physical, chart, labs and discussed the procedure including the risks, benefits and alternatives for the proposed anesthesia with the patient or authorized representative who has indicated his/her understanding and acceptance.     Dental Advisory Given  Plan Discussed with: CRNA  Anesthesia Plan Comments:         Anesthesia Quick Evaluation

## 2024-01-22 NOTE — H&P (Signed)
 @LOGO @   Primary Care Physician:  Trudy Vaughn FALCON, MD Primary Gastroenterologist:  Dr. Shaaron  Pre-Procedure History & Physical: HPI:  Amanda Hopkins is a 78 y.o. female here for  surveillance colonoscopy.  History of multiple colonic adenomas removed over time including advanced adenoma (carcinoma in situ and a rectal polyp over 20 years ago).  Positive family history of colon polyps and colon cancer.    Here for surveillance examination.  Past Medical History:  Diagnosis Date   Arthritis    Diabetes mellitus without complication (HCC)    Hyperlipidemia    Hypertension    patient denies. States she is on losartan for kidney protection.    Nephrolithiasis    Obesity    Palpitations    PONV (postoperative nausea and vomiting)    Reflux esophagitis     Past Surgical History:  Procedure Laterality Date   ABDOMINAL HYSTERECTOMY     APPENDECTOMY     BIOPSY  11/19/2020   Procedure: BIOPSY;  Surgeon: Shaaron Lamar HERO, MD;  [facility];  Service: Endoscopy;;   CATARACT EXTRACTION Left    CATARACT EXTRACTION W/PHACO Right 11/18/2013   Procedure: CATARACT EXTRACTION PHACO AND INTRAOCULAR LENS PLACEMENT (IOC);  Surgeon: Cherene Mania, MD;  Location: [facility];  Service: Ophthalmology;  Laterality: Right;  CDE:  7.76   CESAREAN SECTION     x2   CHEST WALL BIOPSY Right    removal of fatty tumor   COLONOSCOPY  11/08/2004   MFM:ipfpwlupcz rectosigmoid polyp/left side diverticula   COLONOSCOPY  12/2009   RMR: left-sided diverticula, long tortuous colon   COLONOSCOPY  2003   RMR: carcinoma in situ   COLONOSCOPY N/A 04/07/2015    one 6 mm polyp and one 4 mm polyp, scattered left-sided diverticula, redundant colon.  Pathology with benign colonic mucosa.  Recommended repeat colonoscopy in 5 years   COLONOSCOPY WITH PROPOFOL  N/A 11/19/2020   Procedure: COLONOSCOPY WITH PROPOFOL ;  Surgeon: Shaaron Lamar HERO, MD;  [facility]: [facility];  Service: Endoscopy;  Laterality: N/A;  10:00am    POLYPECTOMY  11/19/2020   Procedure: POLYPECTOMY;  Surgeon: Shaaron Lamar HERO, MD;  [facility]: [facility];  Service: Endoscopy;;    Prior to Admission medications   Medication Sig Start Date End Date Taking? Authorizing Provider  Accu-Chek Softclix Lancets lancets USE 1  TO CHECK GLUCOSE TWICE DAILY 08/02/22  Yes Nida, Gebreselassie W, MD  ARTIFICIAL TEAR SOLUTION OP Place 1 drop into both eyes daily as needed (dry eyes).   Yes [provider]  B-D UF III MINI PEN NEEDLES 31G X 5 MM MISC USE ONCE DAILY AS DIRECTED 01/26/21  Yes Reardon, Benton PARAS, NP  Blood Glucose Monitoring Suppl (ACCU-CHEK GUIDE) w/Device KIT 1 each by Does not apply route 2 (two) times daily. Use as directed to check blood glucose two times daily. 07/25/19  Yes Nida, Gebreselassie W, MD  cyanocobalamin (VITAMIN B12) 1000 MCG tablet Take 1,000 mcg by mouth daily.   Yes [provider]  glipiZIDE  (GLUCOTROL  XL) 2.5 MG 24 hr tablet Take 2.5 mg by mouth every morning.   Yes [provider]  glucose blood test strip 1 each by Other route 2 (two) times daily. Use as instructed to test blood glucose two times daily ACCU-CHEK GUIDE TEST STRIPS 12/17/19  Yes Nida, Gebreselassie W, MD  insulin  degludec (TRESIBA  FLEXTOUCH) 100 UNIT/ML FlexTouch Pen Inject 30 Units into the skin at bedtime. 02/11/21  Yes Therisa Benton PARAS, NP  losartan (  COZAAR) 25 MG tablet Take 25 mg by mouth at bedtime.    Yes [provider]  rosuvastatin (CRESTOR) 10 MG tablet Take 10 mg by mouth at bedtime.    Yes [provider]    Allergies as of 12/21/2023 - Review Complete 12/12/2023  Allergen Reaction Noted   Erythromycin Palpitations 04/29/2013   Ivp dye [iodinated contrast media] Rash 12/31/2019    Family History  Problem Relation Age of Onset   Hypertension Mother    Colon cancer Cousin        paternal    Colon cancer Cousin        paternal   Colon cancer Cousin        paternal   Colon polyps Father     Prostate cancer Father    Breast cancer Maternal Aunt    Colon polyps Brother        multiple   Colon polyps Brother     Social History   Socioeconomic History   Marital status: Married    Spouse name: Not on file   Number of children: 2   Years of education: Not on file   Highest education level: Not on file  Occupational History   Not on file  Tobacco Use   Smoking status: Never   Smokeless tobacco: Never  Vaping Use   Vaping status: Never Used  Substance and Sexual Activity   Alcohol  use: No   Drug use: No   Sexual activity: Yes    Birth control/protection: Surgical  Other Topics Concern   Not on file  Social History Narrative   Not on file   Social Drivers of Health   Financial Resource Strain: Not on file  Food Insecurity: Not on file  Transportation Needs: Not on file  Physical Activity: Not on file  Stress: Not on file  Social Connections: Not on file  Intimate Partner Violence: Not on file    Review of Systems: See HPI, otherwise negative ROS  Physical Exam: BP 112/81   Pulse 88   Temp 97.7 F (36.5 C) (Oral)   Resp 16   Ht 5' 7 (1.702 m)   Wt 97.5 kg   SpO2 97%   BMI 33.67 kg/m  General:   Alert,  Well-developed, well-nourished, pleasant and cooperative in NAD Neck:  Supple; no masses or thyromegaly. No significant cervical adenopathy. Lungs:  Clear throughout to auscultation.   No wheezes, crackles, or rhonchi. No acute distress. Heart:  Regular rate and rhythm; no murmurs, clicks, rubs,  or gallops. Abdomen: Non-distended, normal bowel sounds.  Soft and nontender without appreciable mass or hepatosplenomegaly.  Impression/Plan:    78 year old lady with history of multiple colonic adenomas removed over time.  Positive family history of colon polyps and cancer.  Here for surveillance colonoscopy..  The risks, benefits, limitations, alternatives and imponderables have been reviewed with the patient. Questions have been answered. All parties are  agreeable.       Notice: This dictation was prepared with Dragon dictation along with smaller phrase technology. Any transcriptional errors that result from this process are unintentional and may not be corrected upon review.

## 2024-01-22 NOTE — Op Note (Signed)
 [facility] Patient Name: Amanda Hopkins Procedure Date: 01/22/2024 11:01 AM MRN: [id number] Date of Birth: 1945/06/06 Attending MD: Lamar Ozell Hollingshead , MD, [id number] CSN: [id number] Age: 78 Admit Type: Outpatient Procedure:                Colonoscopy Indications:              High risk colon cancer surveillance: Personal                            history of colonic polyps Providers:                Lamar Ozell Hollingshead, MD, Harlene Lips,                            Kristine L. Shirlean Balm, Technician Referring MD:              Medicines:                Propofol  per Anesthesia Complications:            No immediate complications. Estimated Blood Loss:     Estimated blood loss was minimal. Procedure:                Pre-Anesthesia Assessment:                           - Prior to the procedure, a History and Physical                            was performed, and patient medications and                            allergies were reviewed. The patient's tolerance of                            previous anesthesia was also reviewed. The risks                            and benefits of the procedure and the sedation                            options and risks were discussed with the patient.                            All questions were answered, and informed consent                            was obtained. Prior Anticoagulants: The patient has                            taken no anticoagulant or antiplatelet agents. ASA                            Grade Assessment: III - A patient with severe  systemic disease. After reviewing the risks and                            benefits, the patient was deemed in satisfactory                            condition to undergo the procedure.                           After obtaining informed consent, the colonoscope                            was passed under direct vision. Throughout the                             procedure, the patient's blood pressure, pulse, and                            oxygen saturations were monitored continuously. The                            [id number] ([id number]) Colon was introduced through                            the anus and advanced to the the cecum, identified                            by appendiceal orifice and ileocecal valve. The                            colonoscopy was performed without difficulty. The                            patient tolerated the procedure well. The quality                            of the bowel preparation was adequate. The entire                            colon was examined. Scope In: 11:13:28 AM Scope Out: 11:48:38 AM Total Procedure Duration: 0 hours 35 minutes 10 seconds  Findings:      The perianal and digital rectal examinations were normal.      Multiple medium-mouthed diverticula were found in the sigmoid colon.      Two sessile polyps were found in the splenic flexure. The polyps were 3       to 4 mm in size. These polyps were removed with a cold snare. Resection       and retrieval were complete. Estimated blood loss was minimal.      A 12 mm polyp was found in the proximal sigmoid colon. The polyp was       semi-pedunculated. And growing around the orifice of a diverticulum.       Please see photos. This polyp was removed with 3 passes of a 10 mm  snare. The question of minimal amount of residual polyp tissue at the       brim of the openings of the diverticulum was ablated. Subsequently a       single 360 clip was placed. Finally, tattoo was placed 2 cm distal to       this area.      The exam was otherwise without abnormality on direct and retroflexion       views. Impression:               - Diverticulosis in the sigmoid colon.                           - Two 3 to 4 mm polyps at the splenic flexure,                            removed with a cold snare. Resected and retrieved.                           - One 12 mm  polyp in the proximal sigmoid colon.                           - The examination was otherwise normal on direct                            and retroflexion views. Moderate Sedation:      Moderate (conscious) sedation was personally administered by an       anesthesia professional. The following parameters were monitored: oxygen       saturation, heart rate, blood pressure, respiratory rate, EKG, adequacy       of pulmonary ventilation, and response to care. Recommendation:           - Patient has a contact number available for                            emergencies. The signs and symptoms of potential                            delayed complications were discussed with the                            patient. Return to normal activities tomorrow.                            Written discharge instructions were provided to the                            patient.                           - Advance diet as tolerated.                           - Continue present medications.                           - Repeat colonoscopy  after studies are complete for                            surveillance.                           - Return to GI office (date not yet determined). Procedure Code(s):        --- Professional ---                           [id number], Colonoscopy, flexible; with removal of                            tumor(s), polyp(s), or other lesion(s) by snare                            technique Diagnosis Code(s):        --- Professional ---                           Z86.010, Personal history of colonic polyps                           D12.3, Benign neoplasm of transverse colon (hepatic                            flexure or splenic flexure)                           D12.5, Benign neoplasm of sigmoid colon                           K57.30, Diverticulosis of large intestine without                            perforation or abscess without bleeding CPT copyright 2022 American [employer] Association.  All rights reserved. The codes documented in this report are preliminary and upon coder review may  be revised to meet current compliance requirements. Lamar HERO. Tocarra Gassen, MD Lamar Ozell Hollingshead, MD 01/22/2024 11:57:03 AM This report has been signed electronically. Number of Addenda: 0

## 2024-01-22 NOTE — Transfer of Care (Addendum)
 Immediate Anesthesia Transfer of Care Note  Patient: Amanda Hopkins  Procedure(s) Performed: COLONOSCOPY  Patient Location: Endoscopy Unit  Anesthesia Type:General  Level of Consciousness: drowsy and patient cooperative  Airway & Oxygen Therapy: Patient Spontanous Breathing  Post-op Assessment: Report given to RN and Post -op Vital signs reviewed and stable  Post vital signs: Reviewed and stable  Last Vitals:  Vitals Value Taken Time  BP 105/47 01/22/24   1152  Temp 36.4 01/22/24   1152  Pulse 67 01/22/24   1152  Resp 18 01/22/24   1152  SpO2 97% 01/22/24   1152    Last Pain:  Vitals:   01/22/24 1109  TempSrc:   PainSc: 2       Patients Stated Pain Goal: 5 (01/22/24 0952)  Complications: No notable events documented.

## 2024-01-22 NOTE — Discharge Instructions (Addendum)
  Colonoscopy Discharge Instructions  Read the instructions outlined below and refer to this sheet in the next few weeks. These discharge instructions provide you with general information on caring for yourself after you leave the hospital. Your doctor may also give you specific instructions. While your treatment has been planned according to the most current medical practices available, unavoidable complications occasionally occur. If you have any problems or questions after discharge, call Dr. Shaaron at [phone]. ACTIVITY You may resume your regular activity, but move at a slower pace for the next 24 hours.  Take frequent rest periods for the next 24 hours.  Walking will help get rid of the air and reduce the bloated feeling in your belly (abdomen).  No driving for 24 hours (because of the medicine (anesthesia) used during the test).   Do not sign any important legal documents or operate any machinery for 24 hours (because of the anesthesia used during the test).  NUTRITION Drink plenty of fluids.  You may resume your normal diet as instructed by your doctor.  Begin with a light meal and progress to your normal diet. Heavy or fried foods are harder to digest and may make you feel sick to your stomach (nauseated).  Avoid alcoholic beverages for 24 hours or as instructed.  MEDICATIONS You may resume your normal medications unless your doctor tells you otherwise.  WHAT YOU CAN EXPECT TODAY Some feelings of bloating in the abdomen.  Passage of more gas than usual.  Spotting of blood in your stool or on the toilet paper.  IF YOU HAD POLYPS REMOVED DURING THE COLONOSCOPY: No aspirin products for 7 days or as instructed.  No alcohol  for 7 days or as instructed.  Eat a soft diet for the next 24 hours.  FINDING OUT THE RESULTS OF YOUR TEST Not all test results are available during your visit. If your test results are not back during the visit, make an appointment with your caregiver to find out the  results. Do not assume everything is normal if you have not heard from your caregiver or the medical facility. It is important for you to follow up on all of your test results.  SEEK IMMEDIATE MEDICAL ATTENTION IF: You have more than a spotting of blood in your stool.  Your belly is swollen (abdominal distention).  You are nauseated or vomiting.  You have a temperature over 101.  You have abdominal pain or discomfort that is severe or gets worse throughout the day.        You have diverticulosis.  2 small polyps found and removed  1 large polyp in your sigmoid removed in piecemeal fashion  Further recommendations to follow pending review of pathology report

## 2024-01-22 NOTE — Anesthesia Procedure Notes (Signed)
 Date/Time: 01/22/2024 11:09 AM  Performed by: Para Jerelene CROME, CRNAOxygen Delivery Method: Nasal cannula

## 2024-01-23 ENCOUNTER — Ambulatory Visit: Payer: Self-pay | Admitting: Internal Medicine

## 2024-01-23 LAB — SURGICAL PATHOLOGY

## 2024-01-24 ENCOUNTER — Encounter (HOSPITAL_COMMUNITY): Payer: Self-pay | Admitting: Internal Medicine

## 2024-01-27 NOTE — Anesthesia Postprocedure Evaluation (Signed)
 Anesthesia Post Note  Patient: Amanda Hopkins  Procedure(s) Performed: COLONOSCOPY  Patient location during evaluation: Phase II Anesthesia Type: General Level of consciousness: awake Pain management: pain level controlled Vital Signs Assessment: post-procedure vital signs reviewed and stable Respiratory status: spontaneous breathing and respiratory function stable Cardiovascular status: blood pressure returned to baseline and stable Postop Assessment: no headache and no apparent nausea or vomiting Anesthetic complications: no Comments: Late entry   No notable events documented.   Last Vitals:  Vitals:   01/22/24 1152 01/22/24 1200  BP: (!) 105/47 102/64  Pulse: 67 66  Resp: 18 16  Temp: (!) 36.4 C   SpO2: 97% 97%    Last Pain:  Vitals:   01/22/24 1200  TempSrc:   PainSc: 0-No pain                 Yvonna JINNY Bosworth

## 2024-03-18 DIAGNOSIS — H43393 Other vitreous opacities, bilateral: Secondary | ICD-10-CM | POA: Diagnosis not present

## 2024-04-11 DIAGNOSIS — Z1329 Encounter for screening for other suspected endocrine disorder: Secondary | ICD-10-CM | POA: Diagnosis not present

## 2024-04-11 DIAGNOSIS — I10 Essential (primary) hypertension: Secondary | ICD-10-CM | POA: Diagnosis not present

## 2024-04-11 DIAGNOSIS — E1169 Type 2 diabetes mellitus with other specified complication: Secondary | ICD-10-CM | POA: Diagnosis not present

## 2024-04-11 DIAGNOSIS — Z130 Encounter for screening for diseases of the blood and blood-forming organs and certain disorders involving the immune mechanism: Secondary | ICD-10-CM | POA: Diagnosis not present

## 2024-04-11 DIAGNOSIS — E119 Type 2 diabetes mellitus without complications: Secondary | ICD-10-CM | POA: Diagnosis not present

## 2024-04-11 DIAGNOSIS — E870 Hyperosmolality and hypernatremia: Secondary | ICD-10-CM | POA: Diagnosis not present

## 2024-04-11 DIAGNOSIS — E7849 Other hyperlipidemia: Secondary | ICD-10-CM | POA: Diagnosis not present

## 2024-04-15 ENCOUNTER — Other Ambulatory Visit (HOSPITAL_COMMUNITY): Payer: Self-pay | Admitting: Physician Assistant

## 2024-04-15 DIAGNOSIS — Z6833 Body mass index (BMI) 33.0-33.9, adult: Secondary | ICD-10-CM | POA: Diagnosis not present

## 2024-04-15 DIAGNOSIS — Z0001 Encounter for general adult medical examination with abnormal findings: Secondary | ICD-10-CM | POA: Diagnosis not present

## 2024-04-15 DIAGNOSIS — M810 Age-related osteoporosis without current pathological fracture: Secondary | ICD-10-CM

## 2024-04-15 DIAGNOSIS — Z1331 Encounter for screening for depression: Secondary | ICD-10-CM | POA: Diagnosis not present

## 2024-04-15 DIAGNOSIS — Z1389 Encounter for screening for other disorder: Secondary | ICD-10-CM | POA: Diagnosis not present

## 2024-04-19 DIAGNOSIS — E1169 Type 2 diabetes mellitus with other specified complication: Secondary | ICD-10-CM | POA: Diagnosis not present

## 2024-04-29 ENCOUNTER — Encounter: Payer: Self-pay | Admitting: Internal Medicine

## 2024-04-29 ENCOUNTER — Ambulatory Visit: Admitting: Internal Medicine

## 2024-04-29 VITALS — BP 118/75 | HR 85 | Temp 98.1°F | Ht 66.0 in | Wt 212.0 lb

## 2024-04-29 DIAGNOSIS — Z860100 Personal history of colon polyps, unspecified: Secondary | ICD-10-CM | POA: Diagnosis not present

## 2024-04-29 DIAGNOSIS — R112 Nausea with vomiting, unspecified: Secondary | ICD-10-CM | POA: Diagnosis not present

## 2024-04-29 DIAGNOSIS — K219 Gastro-esophageal reflux disease without esophagitis: Secondary | ICD-10-CM

## 2024-04-29 MED ORDER — PANTOPRAZOLE SODIUM 40 MG PO TBEC: 40.0000 mg | DELAYED_RELEASE_TABLET | Freq: Every day | ORAL | 11 refills | Status: AC

## 2024-04-29 NOTE — Progress Notes (Unsigned)
 Gastroenterology Progress Note    Primary Care Physician:  Trudy Vaughn FALCON, MD Primary Gastroenterologist:  Dr. Shaaron  Pre-Procedure History & Physical: HPI:  Amanda Hopkins is a 78 y.o. female here for for couple episodes of vomiting food debris she had 2 days earlier.  2 episodes since her colonoscopy in August of this year.  Also had a couple episodes of diarrhea but otherwise has a tendency toward records constipation which she manages with Greek yogurt and almond milk daily.  She does have reflux symptoms occasionally does not have any dysphagia.  Aside from the 2 episodes of vomiting she has done well appetite has remained good.  She has a history of advanced colonic adenomas over time I remain removed piecemeal sigmoid polyp back in August; she is to return for a sigmoidoscopy in February of next year.  Hemoglobin A1c's have been running in the 6.8 range.  Past Medical History:  Diagnosis Date   Arthritis    Diabetes mellitus without complication (HCC)    Hyperlipidemia    Hypertension    patient denies. States she is on losartan for kidney protection.    Nephrolithiasis    Obesity    Palpitations    PONV (postoperative nausea and vomiting)    Reflux esophagitis     Past Surgical History:  Procedure Laterality Date   ABDOMINAL HYSTERECTOMY     APPENDECTOMY     BIOPSY  11/19/2020   Procedure: BIOPSY;  Surgeon: Shaaron Lamar HERO, MD;  Location: [facility];  Service: Endoscopy;;   CATARACT EXTRACTION Left    CATARACT EXTRACTION W/PHACO Right 11/18/2013   Procedure: CATARACT EXTRACTION PHACO AND INTRAOCULAR LENS PLACEMENT (IOC);  Surgeon: Cherene Mania, MD;  Location: [facility];  Service: Ophthalmology;  Laterality: Right;  CDE:  7.76   CESAREAN SECTION     x2   CHEST WALL BIOPSY Right    removal of fatty tumor   COLONOSCOPY  11/08/2004   MFM:ipfpwlupcz rectosigmoid polyp/left side diverticula   COLONOSCOPY  12/2009   RMR: left-sided diverticula, long tortuous colon    COLONOSCOPY  2003   RMR: carcinoma in situ   COLONOSCOPY N/A 04/07/2015    one 6 mm polyp and one 4 mm polyp, scattered left-sided diverticula, redundant colon.  Pathology with benign colonic mucosa.  Recommended repeat colonoscopy in 5 years   COLONOSCOPY N/A 01/22/2024   Procedure: COLONOSCOPY;  Surgeon: Shaaron Lamar HERO, MD;  Location: [facility];  Service: Endoscopy;  Laterality: N/A;  11:15 am, asa 2   COLONOSCOPY WITH PROPOFOL  N/A 11/19/2020   Procedure: COLONOSCOPY WITH PROPOFOL ;  Surgeon: Shaaron Lamar HERO, MD;  Location: [facility];  Service: Endoscopy;  Laterality: N/A;  10:00am   POLYPECTOMY  11/19/2020   Procedure: POLYPECTOMY;  Surgeon: Shaaron Lamar HERO, MD;  Location: [facility];  Service: Endoscopy;;    Prior to Admission medications   Medication Sig Start Date End Date Taking? Authorizing Provider  Accu-Chek Softclix Lancets lancets USE 1  TO CHECK GLUCOSE TWICE DAILY 08/02/22  Yes Nida, Gebreselassie W, MD  ARTIFICIAL TEAR SOLUTION OP Place 1 drop into both eyes daily as needed (dry eyes).   Yes [provider]  B-D UF III MINI PEN NEEDLES 31G X 5 MM MISC USE ONCE DAILY AS DIRECTED 01/26/21  Yes Reardon, Benton PARAS, NP  Blood Glucose Monitoring Suppl (ACCU-CHEK GUIDE) w/Device KIT 1 each by Does not apply route 2 (two) times daily. Use as directed to check blood glucose  two times daily. 07/25/19  Yes Nida, Gebreselassie W, MD  cyanocobalamin (VITAMIN B12) 1000 MCG tablet Take 1,000 mcg by mouth daily.   Yes [provider]  glipiZIDE  (GLUCOTROL  XL) 2.5 MG 24 hr tablet Take 2.5 mg by mouth every morning.   Yes [provider]  glucose blood test strip 1 each by Other route 2 (two) times daily. Use as instructed to test blood glucose two times daily ACCU-CHEK GUIDE TEST STRIPS 12/17/19  Yes Nida, Gebreselassie W, MD  insulin  degludec (TRESIBA  FLEXTOUCH) 100 UNIT/ML FlexTouch Pen Inject 30 Units into the skin at bedtime. 02/11/21  Yes Reardon, Benton PARAS, NP   losartan (COZAAR) 25 MG tablet Take 25 mg by mouth at bedtime.    Yes [provider]  rosuvastatin (CRESTOR) 10 MG tablet Take 10 mg by mouth at bedtime.    Yes [provider]  fluticasone (FLONASE) 50 MCG/ACT nasal spray Place 2 sprays into both nostrils daily.    [provider]    Allergies as of 04/29/2024 - Review Complete 04/29/2024  Allergen Reaction Noted   Erythromycin Palpitations 04/29/2013   Ivp dye [iodinated contrast media] Rash 12/31/2019    Family History  Problem Relation Age of Onset   Hypertension Mother    Colon cancer Cousin        paternal    Colon cancer Cousin        paternal   Colon cancer Cousin        paternal   Colon polyps Father    Prostate cancer Father    Breast cancer Maternal Aunt    Colon polyps Brother        multiple   Colon polyps Brother     Social History   Socioeconomic History   Marital status: Married    Spouse name: Not on file   Number of children: 2   Years of education: Not on file   Highest education level: Not on file  Occupational History   Not on file  Tobacco Use   Smoking status: Never   Smokeless tobacco: Never  Vaping Use   Vaping status: Never Used  Substance and Sexual Activity   Alcohol  use: No   Drug use: No   Sexual activity: Yes    Birth control/protection: Surgical  Other Topics Concern   Not on file  Social History Narrative   Not on file   Social Drivers of Health   Financial Resource Strain: Not on file  Food Insecurity: Not on file  Transportation Needs: Not on file  Physical Activity: Not on file  Stress: Not on file  Social Connections: Not on file  Intimate Partner Violence: Not on file    Review of Systems   See HPI, otherwise negative ROS  Physical Exam: BP 118/75   Pulse 85   Temp 98.1 F (36.7 C) (Oral)   Ht 5' 6 (1.676 m)   Wt 212 lb (96.2 kg)   SpO2 94%   BMI 34.22 kg/m  General:   Alert,  Well-developed, well-nourished, pleasant and  cooperative in NAD Neck:  Supple; no masses or thyromegaly. No significant cervical adenopathy. Lungs:  Clear throughout to auscultation.   No wheezes, crackles, or rhonchi. No acute distress. Heart:  Regular rate and rhythm; no murmurs, clicks, rubs,  or gallops. Abdomen: Non-distended, normal bowel sounds.  Soft and nontender without appreciable mass or hepatosplenomegaly.    Impression/Plan:   78 year old lady with a couple episodes of nausea and vomiting over  the past 4 months reports vomiting which she consumed 2 days earlier.  Otherwise, she has had no real upper GI symptoms aside from frequent GERD but no dysphagia.  Not on a PPI.  She certainly may have an element of delayed gastric emptying in the setting of her longstanding diabetes.  2 episodes of diarrhea since her colonoscopy 4 months ago is entirely nonspecific symptoms are not unrelenting and therefore I doubt they are related to an organic cause. History of significant polyp burden in the setting of positive family history of both colon polyps and colon cancer.  Piecemeal removal of sigmoid adenoma in August which will necessitate repeat fast to assess adequacy of resection in February of next year.  Recommendations  I would like you to begin Protonix 40 mg daily dispense 30 with 11 refills.  Take 1 tablet in the morning 30 minutes before breakfast.  Some of your reflux symptoms and nausea can both be treated with cutting the acid down in your stomach with Protonix.  By all Means, continue your Greek yogurt and almond milk routine each morning.  If your symptoms do not settle down, you may benefit from an upper endoscopy  I will see you in February when we will perform a sigmoidoscopy to look at the polypectomy site.  Notice: This dictation was prepared with Dragon dictation along with smaller phrase technology. Any transcriptional errors that result from this process are unintentional and may not be corrected upon review.

## 2024-04-29 NOTE — Patient Instructions (Signed)
 It was nice to see you again today!  I would like you to begin Protonix 40 mg daily dispense 30 with 11 refills.  Take 1 tablet in the morning 30 minutes before breakfast.  Some of your reflux symptoms and nausea can both be treated with cutting the acid down in your stomach with Protonix.  By all Means, continue your Greek yogurt and almond milk routine each morning.  If your symptoms do not settle down, you may benefit from an upper endoscopy  I will see you in February when we will perform a sigmoidoscopy to look at the polypectomy site.

## 2024-06-06 ENCOUNTER — Ambulatory Visit (HOSPITAL_COMMUNITY)
Admission: RE | Admit: 2024-06-06 | Discharge: 2024-06-06 | Disposition: A | Discharge status: Home / Self Care | Source: Ambulatory Visit | Attending: Urology | Admitting: Urology

## 2024-06-06 DIAGNOSIS — N200 Calculus of kidney: Secondary | ICD-10-CM | POA: Insufficient documentation

## 2024-06-11 ENCOUNTER — Telehealth: Payer: Self-pay | Admitting: *Deleted

## 2024-06-11 MED ORDER — PEG 3350-KCL-NA BICARB-NACL 420 G PO SOLR: 4000.0000 mL | Freq: Once | ORAL | 0 refills | Status: AC

## 2024-06-11 NOTE — Telephone Encounter (Signed)
 Spoke with pt. Scheduled FLEX SIG with colon prep with Dr. Shaaron on 07/04/24. Aware will send rx for prep to pharmacy and instructions to her mychart and mail them.

## 2024-06-13 ENCOUNTER — Ambulatory Visit: Admitting: Urology

## 2024-07-04 ENCOUNTER — Other Ambulatory Visit: Payer: Self-pay

## 2024-07-04 ENCOUNTER — Encounter (HOSPITAL_COMMUNITY): Payer: Self-pay | Admitting: Internal Medicine

## 2024-07-04 ENCOUNTER — Encounter (HOSPITAL_COMMUNITY)
Admission: RE | Disposition: A | Discharge status: Home / Self Care | Payer: Self-pay | Source: Home / Self Care | Attending: Internal Medicine

## 2024-07-04 ENCOUNTER — Ambulatory Visit (HOSPITAL_COMMUNITY)
Admission: RE | Admit: 2024-07-04 | Discharge: 2024-07-04 | Disposition: A | Source: Home / Self Care | Attending: Internal Medicine | Admitting: Internal Medicine

## 2024-07-04 ENCOUNTER — Ambulatory Visit (HOSPITAL_COMMUNITY): Admitting: Anesthesiology

## 2024-07-04 DIAGNOSIS — Z1211 Encounter for screening for malignant neoplasm of colon: Secondary | ICD-10-CM | POA: Diagnosis not present

## 2024-07-04 DIAGNOSIS — L818 Other specified disorders of pigmentation: Secondary | ICD-10-CM

## 2024-07-04 DIAGNOSIS — K5730 Diverticulosis of large intestine without perforation or abscess without bleeding: Secondary | ICD-10-CM

## 2024-07-04 DIAGNOSIS — K649 Unspecified hemorrhoids: Secondary | ICD-10-CM | POA: Diagnosis not present

## 2024-07-04 LAB — GLUCOSE, CAPILLARY: Glucose-Capillary: 172 mg/dL — ABNORMAL HIGH (ref 70–99)

## 2024-07-04 MED ORDER — LACTATED RINGERS IV SOLN: INTRAVENOUS | Status: DC | PRN

## 2024-07-04 MED ORDER — PROPOFOL 500 MG/50ML IV EMUL
INTRAVENOUS | Status: DC | PRN
  Administered 2024-07-04: 100 ug/kg/min via INTRAVENOUS
  Administered 2024-07-04: 120 mg via INTRAVENOUS

## 2024-07-04 MED ORDER — PHENYLEPHRINE 80 MCG/ML (10ML) SYRINGE FOR IV PUSH (FOR BLOOD PRESSURE SUPPORT)
PREFILLED_SYRINGE | INTRAVENOUS | Status: DC | PRN
  Administered 2024-07-04: 80 ug via INTRAVENOUS

## 2024-07-04 MED ORDER — LIDOCAINE 2% (20 MG/ML) 5 ML SYRINGE
INTRAMUSCULAR | Status: DC | PRN
  Administered 2024-07-04: 60 mg via INTRAVENOUS

## 2024-07-04 NOTE — Transfer of Care (Signed)
 Immediate Anesthesia Transfer of Care Note  Patient: Amanda Hopkins  Procedure(s) Performed: KINGSTON SIDE  Patient Location: Endoscopy Unit  Anesthesia Type:MAC  Level of Consciousness: awake and patient cooperative  Airway & Oxygen Therapy: Patient Spontanous Breathing  Post-op Assessment: Report given to RN and Post -op Vital signs reviewed and stable  Post vital signs: Reviewed and stable  Last Vitals:  Vitals Value Taken Time  BP 121/60 07/04/24 09:58  Temp 36.7 C 07/04/24 09:53  Pulse 68 07/04/24 09:58  Resp 17 07/04/24 09:58  SpO2 95 % 07/04/24 09:58    Last Pain:  Vitals:   07/04/24 0953  TempSrc: Oral  PainSc: 0-No pain      Patients Stated Pain Goal: 7 (07/04/24 0821)  Complications: No notable events documented.

## 2024-07-04 NOTE — Discharge Instructions (Addendum)
" °    Sigmoidoscopy Discharge Instructions  Read the instructions outlined below and refer to this sheet in the next few weeks. These discharge instructions provide you with general information on caring for yourself after you leave the hospital. Your doctor may also give you specific instructions. While your treatment has been planned according to the most current medical practices available, unavoidable complications occasionally occur. If you have any problems or questions after discharge, call Dr. Shaaron at [phone]. ACTIVITY You may resume your regular activity, but move at a slower pace for the next 24 hours.  Take frequent rest periods for the next 24 hours.  Walking will help get rid of the air and reduce the bloated feeling in your belly (abdomen).  No driving for 24 hours (because of the medicine (anesthesia) used during the test).   Do not sign any important legal documents or operate any machinery for 24 hours (because of the anesthesia used during the test).  NUTRITION Drink plenty of fluids.  You may resume your normal diet as instructed by your doctor.  Begin with a light meal and progress to your normal diet. Heavy or fried foods are harder to digest and may make you feel sick to your stomach (nauseated).  Avoid alcoholic beverages for 24 hours or as instructed.  MEDICATIONS You may resume your normal medications unless your doctor tells you otherwise.  WHAT YOU CAN EXPECT TODAY Some feelings of bloating in the abdomen.  Passage of more gas than usual.  Spotting of blood in your stool or on the toilet paper.  IF YOU HAD POLYPS REMOVED DURING THE COLONOSCOPY: No aspirin products for 7 days or as instructed.  No alcohol  for 7 days or as instructed.  Eat a soft diet for the next 24 hours.  FINDING OUT THE RESULTS OF YOUR TEST Not all test results are available during your visit. If your test results are not back during the visit, make an appointment with your caregiver to find out  the results. Do not assume everything is normal if you have not heard from your caregiver or the medical facility. It is important for you to follow up on all of your test results.  SEEK IMMEDIATE MEDICAL ATTENTION IF: You have more than a spotting of blood in your stool.  Your belly is swollen (abdominal distention).  You are nauseated or vomiting.  You have a temperature over 101.  You have abdominal pain or discomfort that is severe or gets worse throughout the day.      no evidence of residual polyp.  Consider 1 more screening colonoscopy in 2 years "

## 2024-07-04 NOTE — Op Note (Signed)
 [facility] Patient Name: Amanda Hopkins Procedure Date: 07/04/2024 8:54 AM MRN: [id number] Date of Birth: September 09, 1945 Attending MD: Lamar Ozell Hollingshead , MD, [id number] CSN: [id number] Age: 79 Admit Type: Outpatient Procedure:                Flexible Sigmoidoscopy Indications:              High risk colon cancer surveillance: Personal                            history of colonic polyps Providers:                Lamar Ozell Hollingshead, MD, Rosina Sprague, Bascom Blush Referring MD:              Medicines:                Propofol  per Anesthesia Complications:            No immediate complications. Estimated Blood Loss:     Estimated blood loss: none. Procedure:                Pre-Anesthesia Assessment:                           - Prior to the procedure, a History and Physical                            was performed, and patient medications and                            allergies were reviewed. The patient's tolerance of                            previous anesthesia was also reviewed. The risks                            and benefits of the procedure and the sedation                            options and risks were discussed with the patient.                            All questions were answered, and informed consent                            was obtained. Prior Anticoagulants: The patient has                            taken no anticoagulant or antiplatelet agents. ASA                            Grade Assessment: III - A patient with severe                            systemic disease. After reviewing the risks and  benefits, the patient was deemed in satisfactory                            condition to undergo the procedure.                           After obtaining informed consent, the scope was                            passed under direct vision. The CF-[id number] ([id number])                            Colon was introduced through the anus and advanced                             to the the sigmoid colon. The flexible                            sigmoidoscopy was accomplished without difficulty.                            The patient tolerated the procedure well. The                            quality of the bowel preparation was adequate. Scope In: 9:41:52 AM Scope Out: 9:49:55 AM Total Procedure Duration: 0 hours 8 minutes 3 seconds  Findings:      The perianal and digital rectal examinations were normal.       Normal-appearing rectal mucosa aside from minimal hemorrhoids and       sigmoid diverticulosis. Area of prior tattoo readily identified. No       evidence of recurrent polyps anywhere around the tattoo site or adjacent       diverticula. [name] and retroflexed of the rectum revealed no       abnormalities aside from minimal hemorrhoids.. Impression:               Sigmoid diverticulosis. No evidence of persisting                            polyp                           Minimal hemorrhoids Moderate Sedation:      Moderate (conscious) sedation was personally administered by an       anesthesia professional. The following parameters were monitored: oxygen       saturation, heart rate, blood pressure, respiratory rate, EKG, adequacy       of pulmonary ventilation, and response to care. Recommendation:           - Patient has a contact number available for                            emergencies. The signs and symptoms of potential                            delayed complications were discussed with the  patient. Return to normal activities tomorrow.                            Written discharge instructions were provided to the                            patient.                           -Advance diet as tolerated. Consider 1 more                            surveillance colonoscopy in 2 years if overall                            health permits. Procedure Code(s):        --- Professional ---                            [id number], Sigmoidoscopy, flexible; diagnostic,                            including collection of specimen(s) by brushing or                            washing, when performed (separate procedure) Diagnosis Code(s):        --- Professional ---                           Z86.010, Personal history of colonic polyps CPT copyright 2022 [employer]. All rights reserved. The codes documented in this report are preliminary and upon coder review may  be revised to meet current compliance requirements. Lamar HERO. Mckaylah Bettendorf, MD Lamar Ozell Hollingshead, MD 07/04/2024 10:00:18 AM This report has been signed electronically. Number of Addenda: 0

## 2024-07-04 NOTE — Anesthesia Preprocedure Evaluation (Signed)
 Anesthesia Evaluation  Patient identified by MRN, date of birth, ID band Patient awake    Reviewed: Allergy & Precautions, H&P , NPO status , Patient's Chart, lab work & pertinent test results, reviewed documented beta blocker date and time   History of Anesthesia Complications (+) PONV and history of anesthetic complications  Airway Mallampati: II  TM Distance: >3 FB Neck ROM: full    Dental no notable dental hx.    Pulmonary neg pulmonary ROS   Pulmonary exam normal breath sounds clear to auscultation       Cardiovascular Exercise Tolerance: Good hypertension,  Rhythm:regular Rate:Normal     Neuro/Psych negative neurological ROS  negative psych ROS   GI/Hepatic negative GI ROS, Neg liver ROS,,,  Endo/Other  diabetes    Renal/GU Renal disease  negative genitourinary   Musculoskeletal   Abdominal   Peds  Hematology negative hematology ROS (+)   Anesthesia Other Findings   Reproductive/Obstetrics negative OB ROS                              Anesthesia Physical Anesthesia Plan  ASA: 2  Anesthesia Plan: General   Post-op Pain Management:    Induction:   PONV Risk Score and Plan: Propofol  infusion  Airway Management Planned:   Additional Equipment:   Intra-op Plan:   Post-operative Plan:   Informed Consent: I have reviewed the patients History and Physical, chart, labs and discussed the procedure including the risks, benefits and alternatives for the proposed anesthesia with the patient or authorized representative who has indicated his/her understanding and acceptance.     Dental Advisory Given  Plan Discussed with: CRNA  Anesthesia Plan Comments:         Anesthesia Quick Evaluation

## 2024-07-04 NOTE — Anesthesia Postprocedure Evaluation (Signed)
"   Anesthesia Post Note  Patient: Amanda Hopkins  Procedure(s) Performed: KINGSTON SIDE  Patient location during evaluation: Phase II Anesthesia Type: General Level of consciousness: awake Pain management: pain level controlled Vital Signs Assessment: post-procedure vital signs reviewed and stable Respiratory status: spontaneous breathing and respiratory function stable Cardiovascular status: blood pressure returned to baseline and stable Postop Assessment: no headache and no apparent nausea or vomiting Anesthetic complications: no Comments: Late entry   No notable events documented.   Last Vitals:  Vitals:   07/04/24 0953 07/04/24 0958  BP: (!) 88/53 121/60  Pulse: 72 68  Resp: 11 17  Temp: 36.7 C   SpO2: 97% 95%    Last Pain:  Vitals:   07/04/24 0953  TempSrc: Oral  PainSc: 0-No pain                 Amanda Hopkins      "

## 2024-07-04 NOTE — H&P (Signed)
 @LOGO @   Gastroenterology Progress Note    Primary Care Physician:  Trudy Vaughn FALCON, MD Primary Gastroenterologist:  Dr. Shaaron  Pre-Procedure History & Physical: HPI:  Amanda Hopkins is a 79 y.o. female here for   Surveillance of sigmoid polypectomy site piecemeal removed last year.  Serrated adenoma removed.  It was felt it was completely removed but follow-up look at the site best practice here as discussed with the patient previously and again today at the bedside.  Past Medical History:  Diagnosis Date   Arthritis    Diabetes mellitus without complication (HCC)    Hyperlipidemia    Hypertension    patient denies. States she is on losartan for kidney protection.    Nephrolithiasis    Obesity    Palpitations    PONV (postoperative nausea and vomiting)    Reflux esophagitis     Past Surgical History:  Procedure Laterality Date   ABDOMINAL HYSTERECTOMY     APPENDECTOMY     BIOPSY  11/19/2020   Procedure: BIOPSY;  Surgeon: Shaaron Lamar HERO, MD;  Location: [facility];  Service: Endoscopy;;   CATARACT EXTRACTION Left    CATARACT EXTRACTION W/PHACO Right 11/18/2013   Procedure: CATARACT EXTRACTION PHACO AND INTRAOCULAR LENS PLACEMENT (IOC);  Surgeon: Cherene Mania, MD;  Location: [facility];  Service: Ophthalmology;  Laterality: Right;  CDE:  7.76   CESAREAN SECTION     x2   CHEST WALL BIOPSY Right    removal of fatty tumor   COLONOSCOPY  11/08/2004   MFM:ipfpwlupcz rectosigmoid polyp/left side diverticula   COLONOSCOPY  12/2009   RMR: left-sided diverticula, long tortuous colon   COLONOSCOPY  2003   RMR: carcinoma in situ   COLONOSCOPY N/A 04/07/2015    one 6 mm polyp and one 4 mm polyp, scattered left-sided diverticula, redundant colon.  Pathology with benign colonic mucosa.  Recommended repeat colonoscopy in 5 years   COLONOSCOPY N/A 01/22/2024   Procedure: COLONOSCOPY;  Surgeon: Shaaron Lamar HERO, MD;  Location: [facility];  Service: Endoscopy;  Laterality: N/A;  11:15 am,  asa 2   COLONOSCOPY WITH PROPOFOL  N/A 11/19/2020   Procedure: COLONOSCOPY WITH PROPOFOL ;  Surgeon: Shaaron Lamar HERO, MD;  Location: [facility];  Service: Endoscopy;  Laterality: N/A;  10:00am   POLYPECTOMY  11/19/2020   Procedure: POLYPECTOMY;  Surgeon: Shaaron Lamar HERO, MD;  Location: [facility];  Service: Endoscopy;;    Prior to Admission medications  Medication Sig Start Date End Date Taking? Authorizing Provider  Accu-Chek Softclix Lancets lancets USE 1  TO CHECK GLUCOSE TWICE DAILY 08/02/22  Yes Nida, Gebreselassie W, MD  ARTIFICIAL TEAR SOLUTION OP Place 1 drop into both eyes daily as needed (dry eyes).   Yes [provider]  B-D UF III MINI PEN NEEDLES 31G X 5 MM MISC USE ONCE DAILY AS DIRECTED 01/26/21  Yes Reardon, Benton PARAS, NP  Blood Glucose Monitoring Suppl (ACCU-CHEK GUIDE) w/Device KIT 1 each by Does not apply route 2 (two) times daily. Use as directed to check blood glucose two times daily. 07/25/19  Yes Nida, Gebreselassie W, MD  cyanocobalamin (VITAMIN B12) 1000 MCG tablet Take 1,000 mcg by mouth daily.   Yes [provider]  fluticasone (FLONASE) 50 MCG/ACT nasal spray Place 2 sprays into both nostrils daily.   Yes [provider]  glipiZIDE  (GLUCOTROL  XL) 2.5 MG 24 hr tablet Take 2.5 mg by mouth every morning.   Yes [provider]  glucose blood test  strip 1 each by Other route 2 (two) times daily. Use as instructed to test blood glucose two times daily ACCU-CHEK GUIDE TEST STRIPS 12/17/19  Yes Nida, Gebreselassie W, MD  losartan (COZAAR) 25 MG tablet Take 25 mg by mouth at bedtime.    Yes [provider]  pantoprazole  (PROTONIX ) 40 MG tablet Take 1 tablet (40 mg total) by mouth daily. 04/29/24  Yes Genese Quebedeaux, Lamar HERO, MD  rosuvastatin (CRESTOR) 10 MG tablet Take 10 mg by mouth at bedtime.    Yes [provider]  insulin  degludec (TRESIBA  FLEXTOUCH) 100 UNIT/ML FlexTouch Pen Inject 30 Units into the skin at bedtime. 02/11/21    Therisa Benton PARAS, NP    Allergies as of 06/11/2024 - Review Complete 04/29/2024  Allergen Reaction Noted   Erythromycin Palpitations 04/29/2013   Ivp dye [iodinated contrast media] Rash 12/31/2019    Family History  Problem Relation Age of Onset   Hypertension Mother    Colon cancer Cousin        paternal    Colon cancer Cousin        paternal   Colon cancer Cousin        paternal   Colon polyps Father    Prostate cancer Father    Breast cancer Maternal Aunt    Colon polyps Brother        multiple   Colon polyps Brother     Social History   Socioeconomic History   Marital status: Married    Spouse name: Not on file   Number of children: 2   Years of education: Not on file   Highest education level: Not on file  Occupational History   Not on file  Tobacco Use   Smoking status: Never   Smokeless tobacco: Never  Vaping Use   Vaping status: Never Used  Substance and Sexual Activity   Alcohol  use: No   Drug use: No   Sexual activity: Yes    Birth control/protection: Surgical  Other Topics Concern   Not on file  Social History Narrative   Not on file   Social Drivers of Health   Tobacco Use: Low Risk (07/04/2024)   Patient History    Smoking Tobacco Use: Never    Smokeless Tobacco Use: Never    Passive Exposure: Not on file  Financial Resource Strain: Not on file  Food Insecurity: Not on file  Transportation Needs: Not on file  Physical Activity: Not on file  Stress: Not on file  Social Connections: Not on file  Intimate Partner Violence: Not on file  Depression (PHQ2-9): Not on file  Alcohol  Screen: Not on file  Housing: Not on file  Utilities: Not on file  Health Literacy: Not on file    Review of Systems   See HPI, otherwise negative ROS  Physical Exam: BP (!) 140/63   Pulse 89   Temp 97.9 F (36.6 C) (Oral)   Ht 5' 7 (1.702 m)   Wt 94.8 kg   SpO2 96%   BMI 32.73 kg/m  General:   Alert,  Well-developed, well-nourished, pleasant and  cooperative in NAD Neck:  Supple; no masses or thyromegaly. No significant cervical adenopathy. Lungs:  Clear throughout to auscultation.   No wheezes, crackles, or rhonchi. No acute distress. Heart:  Regular rate and rhythm; no murmurs, clicks, rubs,  or gallops. Abdomen: Non-distended, normal bowel sounds.  Soft and nontender without appreciable mass or hepatosplenomegaly.  Impression/Plan:     79 year old lady with a history  of multiple colonic adenomas removed previously.  12 mm sigmoid polyp arising out a diverticulum piecemeal removed last year.  Flex sig now being done to assess adequacy of resection.The risks, benefits, limitations, alternatives and imponderables have been reviewed with the patient. Questions have been answered. All parties are agreeable.      Notice: This dictation was prepared with Dragon dictation along with smaller phrase technology. Any transcriptional errors that result from this process are unintentional and may not be corrected upon review.

## 2024-07-04 NOTE — Anesthesia Procedure Notes (Signed)
 Date/Time: 07/04/2024 9:35 AM  Performed by: Barbarann Verneita RAMAN, CRNAPre-anesthesia Checklist: Patient identified, Emergency Drugs available, Suction available, Timeout performed and Patient being monitored Patient Re-evaluated:Patient Re-evaluated prior to induction Oxygen Delivery Method: Nasal Cannula

## 2024-08-09 ENCOUNTER — Ambulatory Visit: Admitting: Urology
